# Patient Record
Sex: Male | Born: 1970
Health system: Southern US, Community
[De-identification: ages and names within clinical notes are randomized; demographics above are authoritative.]

## PROBLEM LIST (undated history)

## (undated) DIAGNOSIS — F419 Anxiety disorder, unspecified: Secondary | ICD-10-CM

## (undated) DIAGNOSIS — F32A Depression, unspecified: Secondary | ICD-10-CM

## (undated) DIAGNOSIS — K859 Acute pancreatitis without necrosis or infection, unspecified: Secondary | ICD-10-CM

## (undated) DIAGNOSIS — K219 Gastro-esophageal reflux disease without esophagitis: Secondary | ICD-10-CM

## (undated) DIAGNOSIS — K861 Other chronic pancreatitis: Secondary | ICD-10-CM

## (undated) DIAGNOSIS — F319 Bipolar disorder, unspecified: Secondary | ICD-10-CM

## (undated) DIAGNOSIS — Z9151 Personal history of suicidal behavior: Secondary | ICD-10-CM

## (undated) DIAGNOSIS — M549 Dorsalgia, unspecified: Secondary | ICD-10-CM

## (undated) DIAGNOSIS — Z915 Personal history of self-harm: Secondary | ICD-10-CM

## (undated) DIAGNOSIS — G8929 Other chronic pain: Secondary | ICD-10-CM

## (undated) DIAGNOSIS — J40 Bronchitis, not specified as acute or chronic: Secondary | ICD-10-CM

## (undated) DIAGNOSIS — J45909 Unspecified asthma, uncomplicated: Secondary | ICD-10-CM

## (undated) DIAGNOSIS — R7611 Nonspecific reaction to tuberculin skin test without active tuberculosis: Secondary | ICD-10-CM

## (undated) DIAGNOSIS — Z720 Tobacco use: Secondary | ICD-10-CM

## (undated) HISTORY — PX: OTHER SURGICAL HISTORY: SHX169

## (undated) HISTORY — PX: BACK SURGERY: SHX140

## (undated) HISTORY — DX: Other chronic pancreatitis: K86.1

## (undated) HISTORY — DX: Anxiety disorder, unspecified: F41.9

## (undated) HISTORY — DX: Tobacco use: Z72.0

## (undated) HISTORY — DX: Nonspecific reaction to tuberculin skin test without active tuberculosis: R76.11

---

## 1998-11-29 ENCOUNTER — Emergency Department (HOSPITAL_COMMUNITY): Admission: EM | Admit: 1998-11-29 | Discharge: 1998-11-29 | Payer: Self-pay | Admitting: Emergency Medicine

## 2000-02-03 ENCOUNTER — Emergency Department (HOSPITAL_COMMUNITY): Admission: EM | Admit: 2000-02-03 | Discharge: 2000-02-03 | Payer: Self-pay | Admitting: Emergency Medicine

## 2000-04-20 ENCOUNTER — Emergency Department (HOSPITAL_COMMUNITY): Admission: EM | Admit: 2000-04-20 | Discharge: 2000-04-20 | Payer: Self-pay | Admitting: Emergency Medicine

## 2000-08-07 ENCOUNTER — Emergency Department (HOSPITAL_COMMUNITY): Admission: EM | Admit: 2000-08-07 | Discharge: 2000-08-07 | Payer: Self-pay | Admitting: *Deleted

## 2000-11-12 ENCOUNTER — Emergency Department (HOSPITAL_COMMUNITY): Admission: EM | Admit: 2000-11-12 | Discharge: 2000-11-12 | Payer: Self-pay | Admitting: Emergency Medicine

## 2000-11-12 ENCOUNTER — Encounter: Payer: Self-pay | Admitting: Emergency Medicine

## 2000-11-23 ENCOUNTER — Emergency Department (HOSPITAL_COMMUNITY): Admission: EM | Admit: 2000-11-23 | Discharge: 2000-11-23 | Payer: Self-pay | Admitting: Emergency Medicine

## 2000-11-24 ENCOUNTER — Emergency Department (HOSPITAL_COMMUNITY): Admission: EM | Admit: 2000-11-24 | Discharge: 2000-11-24 | Payer: Self-pay | Admitting: Emergency Medicine

## 2000-12-04 ENCOUNTER — Emergency Department (HOSPITAL_COMMUNITY): Admission: EM | Admit: 2000-12-04 | Discharge: 2000-12-04 | Payer: Self-pay

## 2000-12-04 ENCOUNTER — Encounter: Payer: Self-pay | Admitting: Emergency Medicine

## 2000-12-26 ENCOUNTER — Encounter: Payer: Self-pay | Admitting: Emergency Medicine

## 2000-12-26 ENCOUNTER — Emergency Department (HOSPITAL_COMMUNITY): Admission: EM | Admit: 2000-12-26 | Discharge: 2000-12-26 | Payer: Self-pay | Admitting: Emergency Medicine

## 2001-07-16 ENCOUNTER — Emergency Department (HOSPITAL_COMMUNITY): Admission: EM | Admit: 2001-07-16 | Discharge: 2001-07-17 | Payer: Self-pay

## 2004-07-17 ENCOUNTER — Emergency Department (HOSPITAL_COMMUNITY): Admission: EM | Admit: 2004-07-17 | Discharge: 2004-07-17 | Payer: Self-pay | Admitting: Emergency Medicine

## 2004-10-28 ENCOUNTER — Emergency Department (HOSPITAL_COMMUNITY): Admission: EM | Admit: 2004-10-28 | Discharge: 2004-10-29 | Payer: Self-pay | Admitting: Emergency Medicine

## 2005-06-05 ENCOUNTER — Emergency Department (HOSPITAL_COMMUNITY): Admission: EM | Admit: 2005-06-05 | Discharge: 2005-06-06 | Payer: Self-pay | Admitting: Emergency Medicine

## 2005-06-07 ENCOUNTER — Inpatient Hospital Stay (HOSPITAL_COMMUNITY): Admission: EM | Admit: 2005-06-07 | Discharge: 2005-06-17 | Payer: Self-pay | Admitting: Psychiatry

## 2005-06-07 ENCOUNTER — Ambulatory Visit: Payer: Self-pay | Admitting: Psychiatry

## 2005-06-13 ENCOUNTER — Encounter: Payer: Self-pay | Admitting: Emergency Medicine

## 2007-01-21 ENCOUNTER — Emergency Department (HOSPITAL_COMMUNITY): Admission: EM | Admit: 2007-01-21 | Discharge: 2007-01-21 | Payer: Self-pay | Admitting: Emergency Medicine

## 2007-01-23 ENCOUNTER — Emergency Department (HOSPITAL_COMMUNITY): Admission: EM | Admit: 2007-01-23 | Discharge: 2007-01-23 | Payer: Self-pay | Admitting: Emergency Medicine

## 2007-02-02 ENCOUNTER — Ambulatory Visit: Payer: Self-pay | Admitting: Family Medicine

## 2007-02-03 ENCOUNTER — Ambulatory Visit: Payer: Self-pay | Admitting: *Deleted

## 2007-02-05 ENCOUNTER — Ambulatory Visit (HOSPITAL_COMMUNITY): Admission: RE | Admit: 2007-02-05 | Discharge: 2007-02-05 | Payer: Self-pay | Admitting: Internal Medicine

## 2007-02-09 ENCOUNTER — Ambulatory Visit: Payer: Self-pay | Admitting: Family Medicine

## 2007-02-17 ENCOUNTER — Ambulatory Visit: Payer: Self-pay | Admitting: Family Medicine

## 2007-04-09 ENCOUNTER — Encounter: Admission: RE | Admit: 2007-04-09 | Discharge: 2007-04-22 | Payer: Self-pay | Admitting: Sports Medicine

## 2007-05-01 ENCOUNTER — Encounter: Admission: RE | Admit: 2007-05-01 | Discharge: 2007-05-01 | Payer: Self-pay | Admitting: Sports Medicine

## 2007-05-09 ENCOUNTER — Emergency Department (HOSPITAL_COMMUNITY): Admission: EM | Admit: 2007-05-09 | Discharge: 2007-05-09 | Payer: Self-pay | Admitting: Emergency Medicine

## 2007-05-13 ENCOUNTER — Encounter: Admission: RE | Admit: 2007-05-13 | Discharge: 2007-05-13 | Payer: Self-pay | Admitting: Family Medicine

## 2007-05-19 ENCOUNTER — Encounter: Admission: RE | Admit: 2007-05-19 | Discharge: 2007-08-17 | Payer: Self-pay | Admitting: Sports Medicine

## 2007-05-23 ENCOUNTER — Emergency Department (HOSPITAL_COMMUNITY): Admission: EM | Admit: 2007-05-23 | Discharge: 2007-05-23 | Payer: Self-pay | Admitting: Emergency Medicine

## 2007-07-07 ENCOUNTER — Ambulatory Visit: Payer: Self-pay | Admitting: Internal Medicine

## 2007-07-09 ENCOUNTER — Ambulatory Visit (HOSPITAL_COMMUNITY): Admission: RE | Admit: 2007-07-09 | Discharge: 2007-07-09 | Payer: Self-pay | Admitting: Internal Medicine

## 2007-07-15 ENCOUNTER — Ambulatory Visit: Payer: Self-pay | Admitting: Internal Medicine

## 2007-09-07 ENCOUNTER — Encounter (INDEPENDENT_AMBULATORY_CARE_PROVIDER_SITE_OTHER): Payer: Self-pay | Admitting: Family Medicine

## 2007-09-07 DIAGNOSIS — F429 Obsessive-compulsive disorder, unspecified: Secondary | ICD-10-CM | POA: Insufficient documentation

## 2007-09-07 DIAGNOSIS — F1021 Alcohol dependence, in remission: Secondary | ICD-10-CM

## 2007-09-07 DIAGNOSIS — F1011 Alcohol abuse, in remission: Secondary | ICD-10-CM | POA: Insufficient documentation

## 2007-09-07 DIAGNOSIS — F1210 Cannabis abuse, uncomplicated: Secondary | ICD-10-CM | POA: Insufficient documentation

## 2007-09-16 ENCOUNTER — Encounter (INDEPENDENT_AMBULATORY_CARE_PROVIDER_SITE_OTHER): Payer: Self-pay | Admitting: *Deleted

## 2008-01-14 ENCOUNTER — Emergency Department (HOSPITAL_COMMUNITY): Admission: EM | Admit: 2008-01-14 | Discharge: 2008-01-14 | Payer: Self-pay | Admitting: Emergency Medicine

## 2008-03-17 ENCOUNTER — Emergency Department (HOSPITAL_COMMUNITY): Admission: EM | Admit: 2008-03-17 | Discharge: 2008-03-17 | Payer: Self-pay | Admitting: Family Medicine

## 2008-03-23 ENCOUNTER — Emergency Department (HOSPITAL_COMMUNITY): Admission: EM | Admit: 2008-03-23 | Discharge: 2008-03-23 | Payer: Self-pay | Admitting: Emergency Medicine

## 2008-03-24 ENCOUNTER — Inpatient Hospital Stay (HOSPITAL_COMMUNITY): Admission: EM | Admit: 2008-03-24 | Discharge: 2008-04-19 | Payer: Self-pay | Admitting: Emergency Medicine

## 2008-03-24 ENCOUNTER — Ambulatory Visit: Payer: Self-pay | Admitting: Internal Medicine

## 2008-04-12 ENCOUNTER — Ambulatory Visit: Payer: Self-pay | Admitting: Psychiatry

## 2008-04-20 ENCOUNTER — Ambulatory Visit: Payer: Self-pay | Admitting: Internal Medicine

## 2008-04-28 ENCOUNTER — Ambulatory Visit: Payer: Self-pay | Admitting: Internal Medicine

## 2008-05-05 ENCOUNTER — Ambulatory Visit: Payer: Self-pay | Admitting: Internal Medicine

## 2008-05-19 ENCOUNTER — Ambulatory Visit: Payer: Self-pay | Admitting: Internal Medicine

## 2008-05-20 ENCOUNTER — Ambulatory Visit: Payer: Self-pay | Admitting: Internal Medicine

## 2008-05-27 ENCOUNTER — Ambulatory Visit (HOSPITAL_COMMUNITY): Admission: RE | Admit: 2008-05-27 | Discharge: 2008-05-27 | Payer: Self-pay | Admitting: Internal Medicine

## 2008-06-07 ENCOUNTER — Ambulatory Visit: Payer: Self-pay | Admitting: Internal Medicine

## 2008-06-07 ENCOUNTER — Emergency Department (HOSPITAL_COMMUNITY): Admission: EM | Admit: 2008-06-07 | Discharge: 2008-06-07 | Payer: Self-pay | Admitting: Emergency Medicine

## 2008-07-07 ENCOUNTER — Ambulatory Visit: Payer: Self-pay | Admitting: Internal Medicine

## 2008-07-08 ENCOUNTER — Ambulatory Visit (HOSPITAL_COMMUNITY): Admission: RE | Admit: 2008-07-08 | Discharge: 2008-07-08 | Payer: Self-pay | Admitting: Internal Medicine

## 2008-08-24 ENCOUNTER — Ambulatory Visit: Payer: Self-pay | Admitting: Internal Medicine

## 2008-08-24 LAB — CONVERTED CEMR LAB
AST: 11 units/L (ref 0–37)
Alkaline Phosphatase: 54 units/L (ref 39–117)
BUN: 16 mg/dL (ref 6–23)
Basophils Relative: 1 % (ref 0–1)
Calcium: 10.5 mg/dL (ref 8.4–10.5)
Chloride: 101 meq/L (ref 96–112)
Creatinine, Ser: 0.98 mg/dL (ref 0.40–1.50)
Eosinophils Absolute: 0 10*3/uL (ref 0.0–0.7)
HCT: 41.7 % (ref 39.0–52.0)
Hemoglobin: 14 g/dL (ref 13.0–17.0)
MCHC: 33.6 g/dL (ref 30.0–36.0)
MCV: 82.9 fL (ref 78.0–100.0)
Monocytes Absolute: 0.6 10*3/uL (ref 0.1–1.0)
Monocytes Relative: 13 % — ABNORMAL HIGH (ref 3–12)
RBC: 5.03 M/uL (ref 4.22–5.81)

## 2008-09-28 ENCOUNTER — Emergency Department (HOSPITAL_COMMUNITY): Admission: EM | Admit: 2008-09-28 | Discharge: 2008-09-28 | Payer: Self-pay | Admitting: Family Medicine

## 2008-10-13 ENCOUNTER — Ambulatory Visit: Payer: Self-pay | Admitting: Internal Medicine

## 2008-11-09 ENCOUNTER — Ambulatory Visit: Payer: Self-pay | Admitting: Internal Medicine

## 2008-11-14 IMAGING — CT CT PELVIS W/ CM
1 of 3 series · 14 of 32 positions shown, 19 images · IV contrast (agent unspecified)
Comparison: 04/01/2008

CT ABDOMEN

CLINICAL DATA: Follow-up of pancreatitis and portal vein
thrombosis.  Nausea vomiting.

CT ABDOMEN AND PELVIS WITH CONTRAST
TECHNIQUE: Multidetector CT imaging of the abdomen and pelvis was
performed using the standard protocol following bolus
administration of intravenous contrast.
Contrast: 100 ml Qmnipaque-UVV and oral contrast.  The patient was
premedicated with Demerol of 75 mg Benadryl.

[Series 2: abd_pel 5.0 b40f st · axial · 0.68mm/px · z∈[+728,+1198]mm · 14 of 108 slices shown, 19 images]
[im 7/108  soft-tissue]
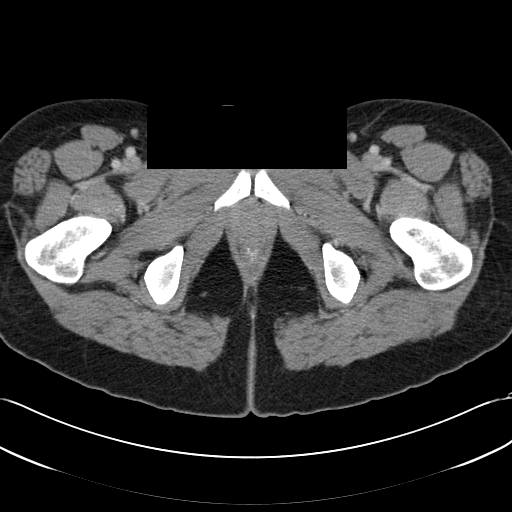
[im 7/108  bone]
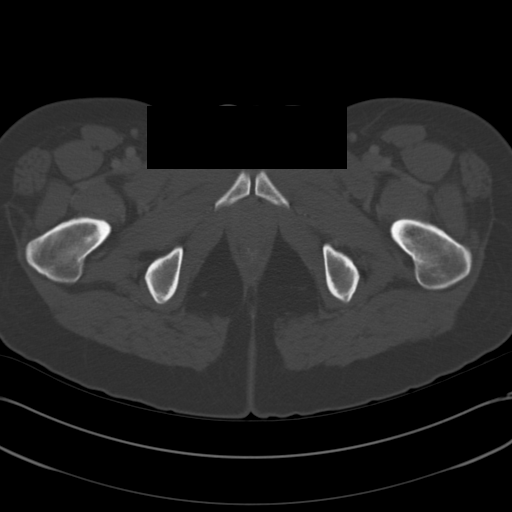
[im 13/108  soft-tissue]
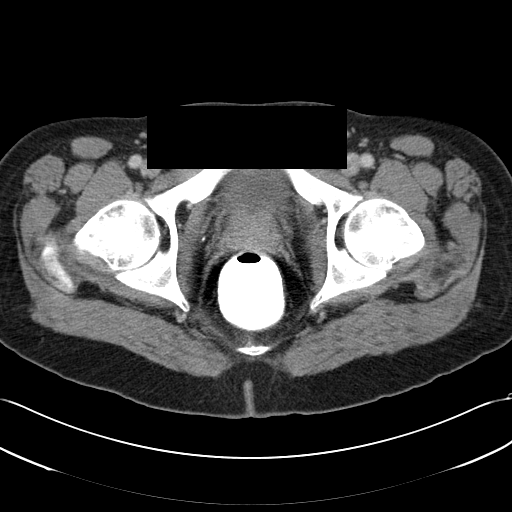
[im 26/108  soft-tissue]
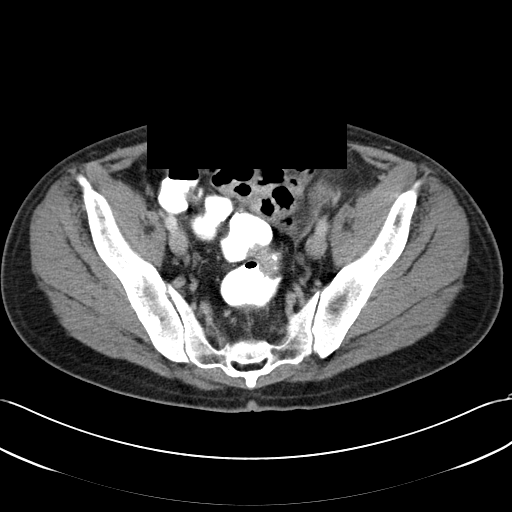
[im 32/108  soft-tissue]
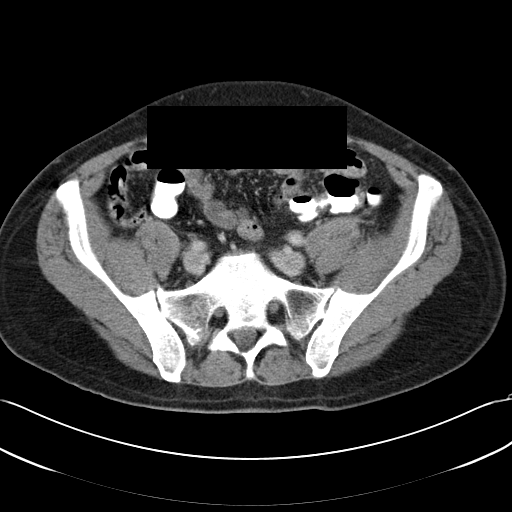
[im 38/108  soft-tissue]
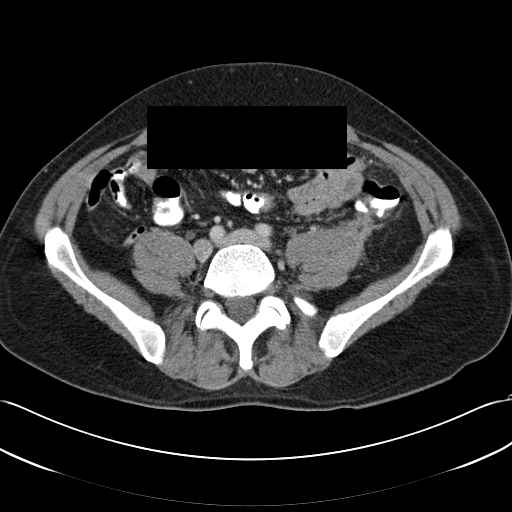
[im 45/108  soft-tissue]
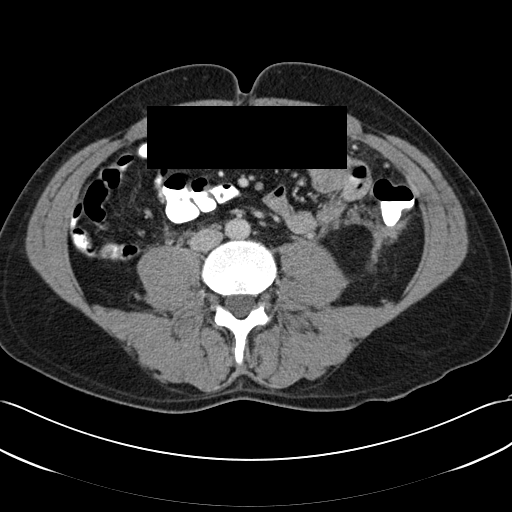
[im 57/108  soft-tissue]
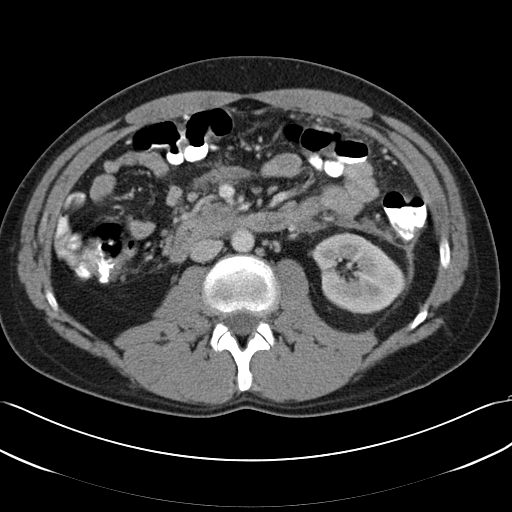
[im 63/108  soft-tissue]
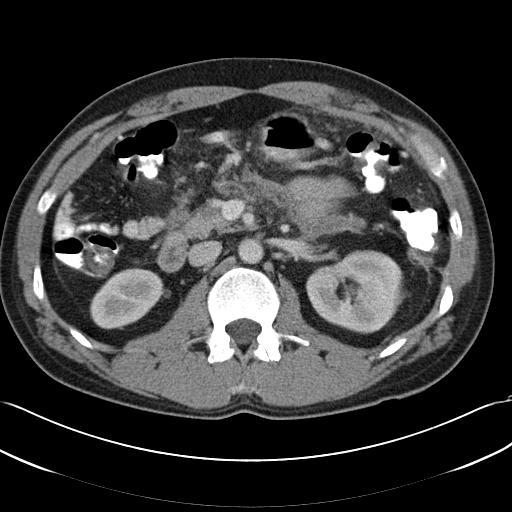
[im 70/108  soft-tissue]
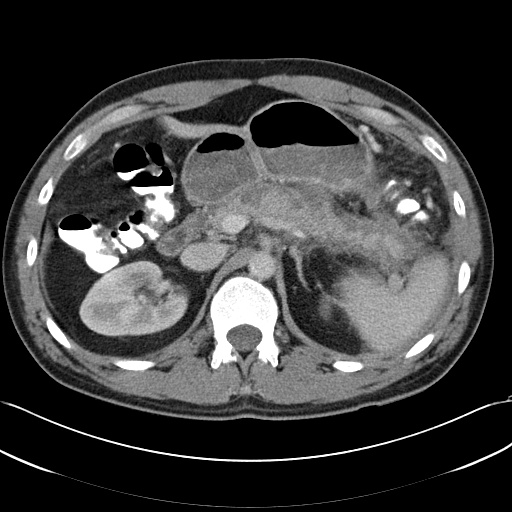
[im 70/108  bone]
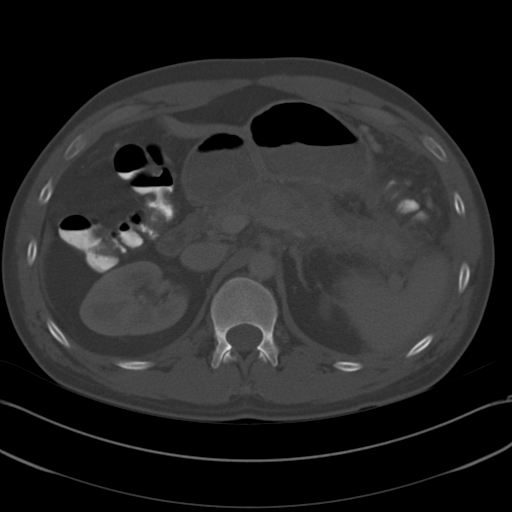
[im 76/108  soft-tissue]
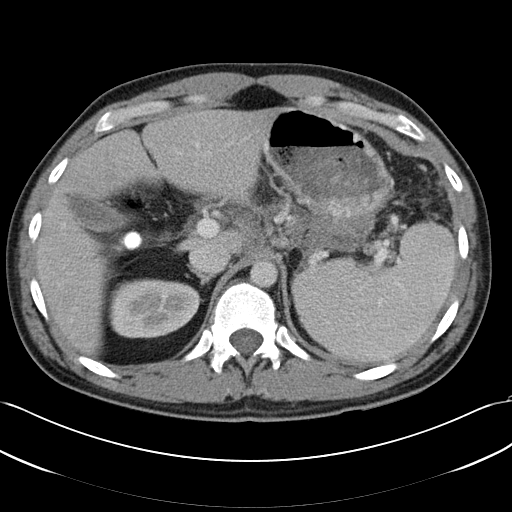
[im 82/108  soft-tissue]
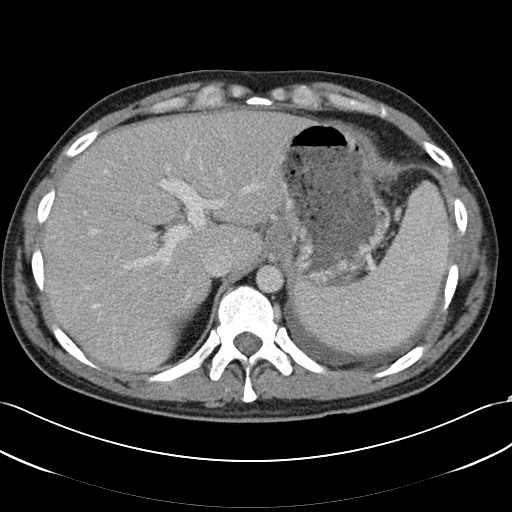
[im 82/108  lung]
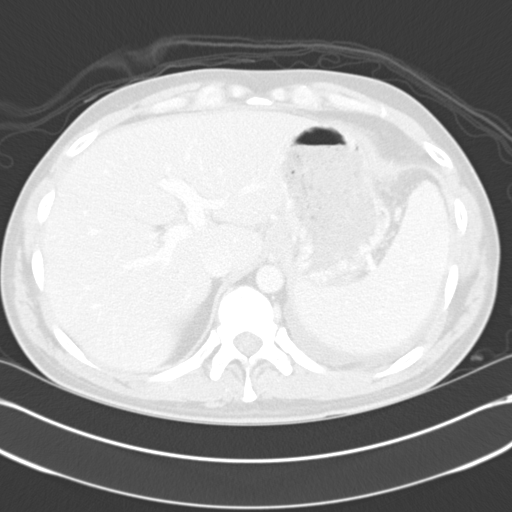
[im 89/108  lung]
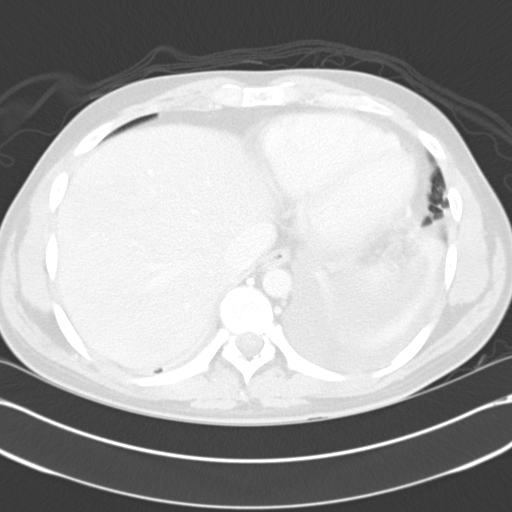
[im 95/108  soft-tissue]
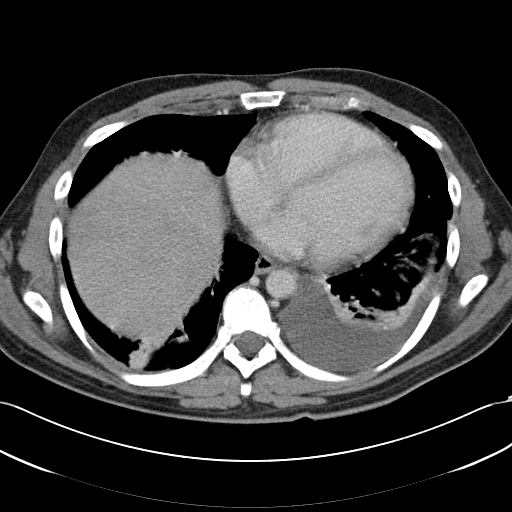
[im 95/108  lung]
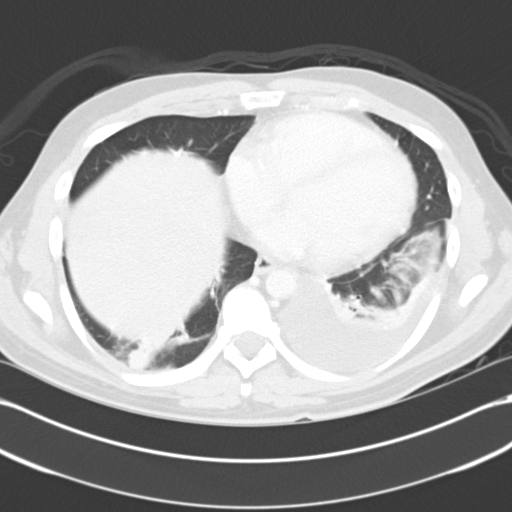
[im 101/108  soft-tissue]
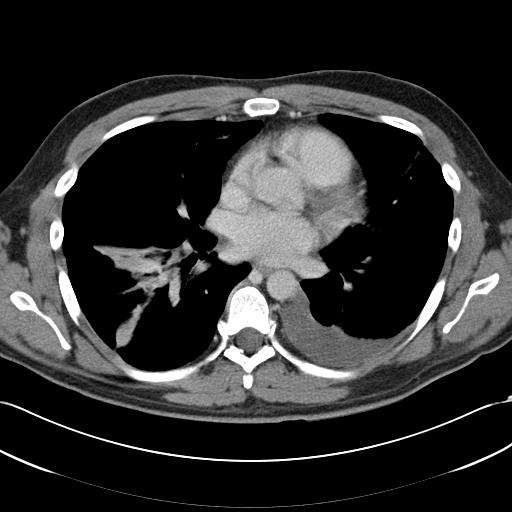
[im 101/108  lung]
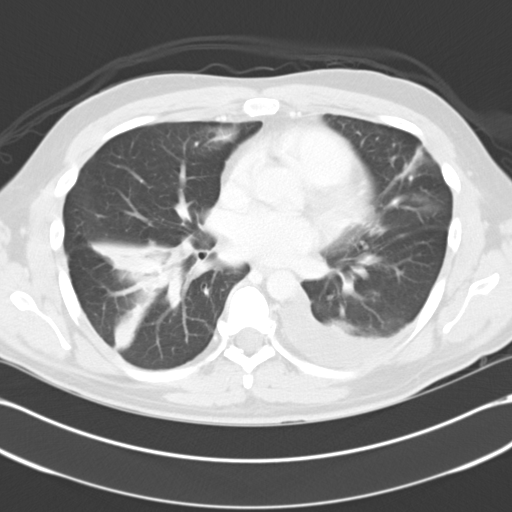

[14 of 32 positions shown; findings below may reference images not displayed]

FINDINGS: Bilateral pleural effusions and atelectasis have
improved since prior study.

Peripancreatic inflammatory changes and fluid also show
improvement.  There is decreased secondary gastric wall thickening.
A small peripancreatic fluid collection appears more well-organized
inferior to the body of the pancreas which measures 4.9 x 3.2 cm on
image number 43.  This is consistent with a developing pseudocyst.

There is no evidence of portal vein thrombosis.  Probable mid
splenic vein thrombosis noted, but not significantly changed in
appearance. The other abdominal parenchymal organs are
unremarkable.
IMPRESSION: 1. Improving acute pancreatitis.  Probable developing pseudocyst
inferior to the pancreatic body measuring 4.9 cm.
2.  Decreased bilateral pleural effusions and bibasilar
atelectasis.
3.  No evidence of portal vein thrombosis on current exam.
Probable mid splenic vein thrombosis again noted.

CT PELVIS
FINDINGS: Decreased retroperitoneal inflammatory changes are seen
extending into the upper left pelvis since prior study.  No
abnormal fluid collections are noted within the pelvis.  There is
no evidence of dilated bowel loops.  There is no evidence of pelvic
mass.
IMPRESSION: Decrease in peripancreatic inflammatory changes extending into the
upper left pelvis.

## 2008-12-09 ENCOUNTER — Ambulatory Visit: Payer: Self-pay | Admitting: Internal Medicine

## 2008-12-21 ENCOUNTER — Inpatient Hospital Stay (HOSPITAL_COMMUNITY): Admission: EM | Admit: 2008-12-21 | Discharge: 2008-12-27 | Payer: Self-pay | Admitting: Emergency Medicine

## 2009-01-03 ENCOUNTER — Ambulatory Visit: Payer: Self-pay | Admitting: Internal Medicine

## 2009-01-30 ENCOUNTER — Emergency Department (HOSPITAL_COMMUNITY): Admission: EM | Admit: 2009-01-30 | Discharge: 2009-01-30 | Payer: Self-pay | Admitting: Emergency Medicine

## 2009-02-01 ENCOUNTER — Ambulatory Visit: Payer: Self-pay | Admitting: Internal Medicine

## 2009-02-19 ENCOUNTER — Emergency Department (HOSPITAL_COMMUNITY): Admission: EM | Admit: 2009-02-19 | Discharge: 2009-02-19 | Payer: Self-pay | Admitting: Emergency Medicine

## 2009-02-23 ENCOUNTER — Ambulatory Visit: Payer: Self-pay | Admitting: Internal Medicine

## 2009-03-16 ENCOUNTER — Other Ambulatory Visit: Payer: Self-pay

## 2009-03-17 ENCOUNTER — Ambulatory Visit: Payer: Self-pay | Admitting: Psychiatry

## 2009-03-17 ENCOUNTER — Inpatient Hospital Stay (HOSPITAL_COMMUNITY): Admission: RE | Admit: 2009-03-17 | Discharge: 2009-03-20 | Payer: Self-pay | Admitting: Psychiatry

## 2009-03-23 ENCOUNTER — Ambulatory Visit: Payer: Self-pay | Admitting: Internal Medicine

## 2009-04-20 ENCOUNTER — Ambulatory Visit: Payer: Self-pay | Admitting: Family Medicine

## 2009-05-31 ENCOUNTER — Ambulatory Visit: Payer: Self-pay | Admitting: Internal Medicine

## 2009-06-21 ENCOUNTER — Ambulatory Visit: Payer: Self-pay | Admitting: Internal Medicine

## 2009-06-21 LAB — CONVERTED CEMR LAB
ALT: 9 units/L (ref 0–53)
AST: 15 units/L (ref 0–37)
Alkaline Phosphatase: 59 units/L (ref 39–117)
Bilirubin, Direct: 0.1 mg/dL (ref 0.0–0.3)
Indirect Bilirubin: 0.5 mg/dL (ref 0.0–0.9)

## 2009-06-23 ENCOUNTER — Ambulatory Visit: Payer: Self-pay | Admitting: Internal Medicine

## 2009-06-30 ENCOUNTER — Emergency Department (HOSPITAL_COMMUNITY): Admission: EM | Admit: 2009-06-30 | Discharge: 2009-06-30 | Payer: Self-pay | Admitting: Emergency Medicine

## 2009-09-25 ENCOUNTER — Encounter: Admission: RE | Admit: 2009-09-25 | Discharge: 2009-12-11 | Payer: Self-pay | Admitting: Anesthesiology

## 2009-09-27 ENCOUNTER — Ambulatory Visit: Payer: Self-pay | Admitting: Internal Medicine

## 2010-02-22 ENCOUNTER — Ambulatory Visit: Payer: Self-pay | Admitting: Internal Medicine

## 2010-02-22 LAB — CONVERTED CEMR LAB
CO2: 25 meq/L (ref 19–32)
Calcium: 9.7 mg/dL (ref 8.4–10.5)
Chloride: 101 meq/L (ref 96–112)
Creatinine, Ser: 0.89 mg/dL (ref 0.40–1.50)
Glucose, Bld: 102 mg/dL — ABNORMAL HIGH (ref 70–99)
Hgb A1c MFr Bld: 5.3 % (ref 4.6–6.1)
Lipase: <10 units/L (ref 0–75)
Total Bilirubin: 0.4 mg/dL (ref 0.3–1.2)
Total Protein: 7 g/dL (ref 6.0–8.3)

## 2010-04-09 ENCOUNTER — Ambulatory Visit: Payer: Self-pay | Admitting: Family Medicine

## 2010-06-04 ENCOUNTER — Ambulatory Visit: Payer: Self-pay | Admitting: Family Medicine

## 2010-08-17 ENCOUNTER — Encounter: Admission: RE | Admit: 2010-08-17 | Discharge: 2010-08-17 | Payer: Self-pay | Admitting: Family Medicine

## 2010-08-17 ENCOUNTER — Ambulatory Visit: Payer: Self-pay | Admitting: Family Medicine

## 2010-08-24 ENCOUNTER — Ambulatory Visit: Payer: Self-pay | Admitting: Family Medicine

## 2010-09-25 ENCOUNTER — Ambulatory Visit: Payer: Self-pay | Admitting: Family Medicine

## 2011-01-20 ENCOUNTER — Encounter: Payer: Self-pay | Admitting: Sports Medicine

## 2011-02-01 ENCOUNTER — Encounter (INDEPENDENT_AMBULATORY_CARE_PROVIDER_SITE_OTHER): Payer: Medicare Other | Admitting: Family Medicine

## 2011-02-01 DIAGNOSIS — R5383 Other fatigue: Secondary | ICD-10-CM

## 2011-02-01 DIAGNOSIS — K859 Acute pancreatitis without necrosis or infection, unspecified: Secondary | ICD-10-CM

## 2011-02-01 DIAGNOSIS — F1010 Alcohol abuse, uncomplicated: Secondary | ICD-10-CM

## 2011-02-01 DIAGNOSIS — Z0000 Encounter for general adult medical examination without abnormal findings: Secondary | ICD-10-CM

## 2011-02-23 HISTORY — PX: OTHER SURGICAL HISTORY: SHX169

## 2011-03-06 ENCOUNTER — Ambulatory Visit (INDEPENDENT_AMBULATORY_CARE_PROVIDER_SITE_OTHER): Payer: Medicare Other | Admitting: Family Medicine

## 2011-03-06 DIAGNOSIS — R5381 Other malaise: Secondary | ICD-10-CM

## 2011-03-06 DIAGNOSIS — Z79899 Other long term (current) drug therapy: Secondary | ICD-10-CM

## 2011-03-06 DIAGNOSIS — F17200 Nicotine dependence, unspecified, uncomplicated: Secondary | ICD-10-CM

## 2011-03-06 DIAGNOSIS — R252 Cramp and spasm: Secondary | ICD-10-CM

## 2011-03-28 ENCOUNTER — Ambulatory Visit (INDEPENDENT_AMBULATORY_CARE_PROVIDER_SITE_OTHER): Payer: Medicare Other | Admitting: Family Medicine

## 2011-03-28 DIAGNOSIS — IMO0001 Reserved for inherently not codable concepts without codable children: Secondary | ICD-10-CM

## 2011-04-02 ENCOUNTER — Emergency Department (HOSPITAL_COMMUNITY)
Admission: EM | Admit: 2011-04-02 | Discharge: 2011-04-03 | Disposition: A | Discharge status: Home / Self Care | Payer: Medicare Other | Attending: Emergency Medicine | Admitting: Emergency Medicine

## 2011-04-02 DIAGNOSIS — IMO0001 Reserved for inherently not codable concepts without codable children: Secondary | ICD-10-CM | POA: Insufficient documentation

## 2011-04-02 DIAGNOSIS — F341 Dysthymic disorder: Secondary | ICD-10-CM | POA: Insufficient documentation

## 2011-04-02 LAB — CBC
HCT: 44.8 % (ref 39.0–52.0)
RDW: 12.9 % (ref 11.5–15.5)
WBC: 6 10*3/uL (ref 4.0–10.5)

## 2011-04-02 LAB — COMPREHENSIVE METABOLIC PANEL
ALT: 20 U/L (ref 0–53)
AST: 27 U/L (ref 0–37)
Albumin: 4.8 g/dL (ref 3.5–5.2)
Alkaline Phosphatase: 59 U/L (ref 39–117)
Calcium: 10.3 mg/dL (ref 8.4–10.5)
GFR calc Af Amer: >60 mL/min (ref >60)
Glucose, Bld: 95 mg/dL (ref 70–99)
Potassium: 3.9 mEq/L (ref 3.5–5.1)
Sodium: 140 mEq/L (ref 135–145)
Total Protein: 7.8 g/dL (ref 6.0–8.3)

## 2011-04-02 LAB — DIFFERENTIAL
Basophils Absolute: 0.1 10*3/uL (ref 0.0–0.1)
Basophils Relative: 1 % (ref 0–1)
Eosinophils Relative: 3 % (ref 0–5)
Lymphocytes Relative: 31 % (ref 12–46)
Neutro Abs: 3.2 10*3/uL (ref 1.7–7.7)

## 2011-04-02 LAB — LIPASE, BLOOD: Lipase: 22 U/L (ref 11–59)

## 2011-04-03 LAB — CK: Total CK: 344 U/L — ABNORMAL HIGH (ref 7–232)

## 2011-04-08 LAB — URINALYSIS, ROUTINE W REFLEX MICROSCOPIC
Glucose, UA: NEGATIVE mg/dL
Hgb urine dipstick: NEGATIVE
Specific Gravity, Urine: 1.006 (ref 1.005–1.030)

## 2011-04-08 LAB — DIFFERENTIAL
Lymphocytes Relative: 38 % (ref 12–46)
Monocytes Absolute: 0.4 10*3/uL (ref 0.1–1.0)
Monocytes Relative: 12 % (ref 3–12)
Neutro Abs: 1.6 10*3/uL — ABNORMAL LOW (ref 1.7–7.7)

## 2011-04-08 LAB — COMPREHENSIVE METABOLIC PANEL
Albumin: 4.5 g/dL (ref 3.5–5.2)
BUN: 14 mg/dL (ref 6–23)
Creatinine, Ser: 0.91 mg/dL (ref 0.4–1.5)
Potassium: 4 mEq/L (ref 3.5–5.1)
Total Protein: 6.7 g/dL (ref 6.0–8.3)

## 2011-04-08 LAB — CBC
HCT: 39.8 % (ref 39.0–52.0)
MCV: 89.4 fL (ref 78.0–100.0)
Platelets: 188 10*3/uL (ref 150–400)
RDW: 12.9 % (ref 11.5–15.5)

## 2011-04-08 LAB — ETHANOL: Alcohol, Ethyl (B): <5 mg/dL (ref 0–10)

## 2011-04-11 LAB — DIFFERENTIAL
Basophils Relative: 1 % (ref 0–1)
Eosinophils Absolute: 0.1 10*3/uL (ref 0.0–0.7)
Eosinophils Relative: 2 % (ref 0–5)
Monocytes Relative: 10 % (ref 3–12)
Neutrophils Relative %: 52 % (ref 43–77)

## 2011-04-11 LAB — ETHANOL: Alcohol, Ethyl (B): 206 mg/dL — ABNORMAL HIGH (ref 0–10)

## 2011-04-11 LAB — COMPREHENSIVE METABOLIC PANEL
ALT: 15 U/L (ref 0–53)
AST: 21 U/L (ref 0–37)
Alkaline Phosphatase: 98 U/L (ref 39–117)
CO2: 29 mEq/L (ref 19–32)
Glucose, Bld: 113 mg/dL — ABNORMAL HIGH (ref 70–99)
Potassium: 4.1 mEq/L (ref 3.5–5.1)
Sodium: 138 mEq/L (ref 135–145)
Total Protein: 6.9 g/dL (ref 6.0–8.3)

## 2011-04-11 LAB — RAPID URINE DRUG SCREEN, HOSP PERFORMED
Amphetamines: NOT DETECTED
Benzodiazepines: POSITIVE — AB
Tetrahydrocannabinol: NOT DETECTED

## 2011-04-11 LAB — ACETAMINOPHEN LEVEL: Acetaminophen (Tylenol), Serum: <10 ug/mL — ABNORMAL LOW (ref 10–30)

## 2011-04-11 LAB — CBC
Hemoglobin: 14.4 g/dL (ref 13.0–17.0)
RBC: 4.72 MIL/uL (ref 4.22–5.81)
RDW: 14.8 % (ref 11.5–15.5)

## 2011-04-11 LAB — AMYLASE: Amylase: 355 U/L — ABNORMAL HIGH (ref 27–131)

## 2011-04-11 LAB — LIPASE, BLOOD: Lipase: 332 U/L — ABNORMAL HIGH (ref 11–59)

## 2011-04-11 LAB — SALICYLATE LEVEL: Salicylate Lvl: <4 mg/dL (ref 2.8–20.0)

## 2011-04-16 LAB — COMPREHENSIVE METABOLIC PANEL
Albumin: 4.6 g/dL (ref 3.5–5.2)
Alkaline Phosphatase: 76 U/L (ref 39–117)
BUN: 11 mg/dL (ref 6–23)
BUN: 7 mg/dL (ref 6–23)
CO2: 28 mEq/L (ref 19–32)
Calcium: 8.8 mg/dL (ref 8.4–10.5)
Chloride: 98 mEq/L (ref 96–112)
Creatinine, Ser: 0.73 mg/dL (ref 0.4–1.5)
GFR calc non Af Amer: >60 mL/min (ref >60)
Glucose, Bld: 114 mg/dL — ABNORMAL HIGH (ref 70–99)
Glucose, Bld: 109 mg/dL — ABNORMAL HIGH (ref 70–99)
Potassium: 4.2 mEq/L (ref 3.5–5.1)
Total Bilirubin: 0.7 mg/dL (ref 0.3–1.2)
Total Protein: 6.2 g/dL (ref 6.0–8.3)

## 2011-04-16 LAB — CBC
HCT: 40.6 % (ref 39.0–52.0)
Hemoglobin: 13.7 g/dL (ref 13.0–17.0)
MCHC: 33.3 g/dL (ref 30.0–36.0)
MCHC: 33.7 g/dL (ref 30.0–36.0)
MCV: 89.6 fL (ref 78.0–100.0)
MCV: 89.4 fL (ref 78.0–100.0)
RDW: 15 % (ref 11.5–15.5)

## 2011-04-16 LAB — RAPID URINE DRUG SCREEN, HOSP PERFORMED
Amphetamines: NOT DETECTED
Barbiturates: NOT DETECTED
Cocaine: POSITIVE — AB
Opiates: NOT DETECTED
Tetrahydrocannabinol: NOT DETECTED
Tetrahydrocannabinol: NOT DETECTED

## 2011-04-16 LAB — URINALYSIS, ROUTINE W REFLEX MICROSCOPIC
Hgb urine dipstick: NEGATIVE
Protein, ur: NEGATIVE mg/dL
Urobilinogen, UA: 1 mg/dL (ref 0.0–1.0)

## 2011-04-16 LAB — DIFFERENTIAL
Basophils Absolute: 0 10*3/uL (ref 0.0–0.1)
Basophils Absolute: 0 10*3/uL (ref 0.0–0.1)
Basophils Relative: 0 % (ref 0–1)
Eosinophils Absolute: 0.1 10*3/uL (ref 0.0–0.7)
Eosinophils Relative: 1 % (ref 0–5)
Lymphs Abs: 0.9 10*3/uL (ref 0.7–4.0)
Monocytes Absolute: 0.3 10*3/uL (ref 0.1–1.0)
Neutro Abs: 5.7 10*3/uL (ref 1.7–7.7)
Neutrophils Relative %: 86 % — ABNORMAL HIGH (ref 43–77)
Neutrophils Relative %: 75 % (ref 43–77)

## 2011-04-16 LAB — LIPASE, BLOOD: Lipase: 11 U/L (ref 11–59)

## 2011-05-06 ENCOUNTER — Ambulatory Visit (INDEPENDENT_AMBULATORY_CARE_PROVIDER_SITE_OTHER): Payer: Medicare Other | Admitting: Family Medicine

## 2011-05-06 DIAGNOSIS — R5381 Other malaise: Secondary | ICD-10-CM

## 2011-05-06 DIAGNOSIS — IMO0001 Reserved for inherently not codable concepts without codable children: Secondary | ICD-10-CM

## 2011-05-14 NOTE — H&P (Signed)
[name], Lawrence NO.:  [id number]   MEDICAL RECORD NO.:  [id number]          PATIENT TYPE:  INP   LOCATION:  5511                         FACILITY:  [facility]   PHYSICIAN:  Lonia Blood, M.D.DATE OF BIRTH:  1971/03/28   DATE OF ADMISSION:  12/21/2008  DATE OF DISCHARGE:                              HISTORY & PHYSICAL   PRIMARY CARE PHYSICIAN:  Joe Lawrence, M.D.   CHIEF COMPLAINT:  Abdominal pain.   HISTORY OF PRESENT ILLNESS:  [name] is a 40 year old who  is known to our service.  He has a long-standing history of chronic  recurrent pancreatitis secondary to alcohol abuse.  He swears up and  down that he has not been drinking alcohol, despite the fact that an  alcohol level of 7 is detected in the emergency room.  Nonetheless he  admits to a one-month history of steadily increasing abdominal pain.  He  states that he has presented to his primary care physician who would not  refill his pain medication prescriptions early and who he states would  not change his baseline pain medication regimen.  The patient does have  a known history of benzodiazepine abuse on top of his alcoholism.  Nonetheless the patient admits that he has been using extra Oxycodone  and has thus run out despite the fact that it is not yet the end of the  month.  As a result he has experienced severe abdominal pain.  With this  he has had very decreased p.o. intake for the past 3-4 days per his  report.  He however denies nausea, vomiting, diarrhea, constipation,  hematemesis, hematochezia, or melena.  There has been no abdominal  distention.  Again he vehemently denies alcohol abuse though his alcohol  level is not negative in the emergency room.   REVIEW OF SYSTEMS:  Comprehensive review of systems is unremarkable with  the exception of the positive elements noted in the history of present  illness above.   PAST MEDICAL HISTORY:  1. Chronic recurrent  pancreatitis secondary to alcoholism.  2. Splenic vein thrombosis with previous completion of course of      anticoagulation.  3. Chronic severe alcoholism.  4. Chronic low back pain.  5. Hypertension.  6. Tobacco abuse.  7. Anxiety disorder.  8. History of benzodiazepine abuse.   OUTPATIENT MEDICATIONS:  1. Celexa 40 mg daily.  2. Seroquel 300 mg daily.  3. Fentanyl patch 75 mcg per hour q.72 h.  4. Oxycodone 30 mg q.i.d.  5. Creon 24 one tablet q.a.c.  6. Lorazepam 1 mg in the morning, 1 mg in the evening and 0.5 mg at      bedtime.  7. Omeprazole 40 mg daily.   ALLERGIES:  1. SULFA.  2. IODINE.  3. THORAZINE.   FAMILY HISTORY:  Noncontributory to this admission.   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  He denies use of street drugs.  He lives in the [place] area.   LABORATORY DATA:  CBC is unremarkable.  BMET is unremarkable.  Albumin  is  4.1.  Alcohol level is 7.  Lipase is 14.  Urinalysis is negative.  Urine drug screen is positive for opiates and benzodiazepines.  Acetaminophen level is undetectable.   PHYSICAL EXAMINATION:  VITAL SIGNS:  Temperature 98, blood pressure  136/86, heart rate 108, respiratory rate 22, O2 saturations 99% on room  air.  GENERAL:  Thin, well-developed gentleman in no acute respiratory  distress.  NECK:  No JVD.  LUNGS:  Clear to auscultation bilaterally without wheezes or rhonchi.  CARDIOVASCULAR:  Regular rate and rhythm without murmur, gallop, or rub.  Normal S2 with the exception of mild sinus tachycardia.  ABDOMEN:  Diffusely tender to palpation but worsened epigastrium.  No  rebound, no ascites, nondistended, soft.  Bowel sounds are appreciable.  EXTREMITIES:  No cyanosis, clubbing, or edema of bilateral upper and  lower extremities.  NEUROLOGIC:  The patient's neurologic exam is nonfocal.   IMPRESSION/PLAN:  1. Severe recurrent abdominal pain.  It is unclear to me at this time      if this  represents a recurrence of chronic pancreatitis pain or if      in fact this is simply a patient who is attempting to receive pain      medication.  For now I have no definite way to tell the difference.      In the interest of assuring that this gentleman is not suffering      with treatable physiologic pain, I will admit the patient for pain      control.  I will however be mindful to avoid over aggressive use of      pain medications.  We will treat him with Dilaudid at 1 mg IV q.2      h. p.r.n. pain.  As long as the patient is requiring IV pain      medications we will limit him to n.p.o. status.  At such time that      the patient feels that his pain is improved we will discontinue all      IV narcotics and transition him back to fentanyl and Oxycodone,      resume his Creon, and monitor.  If we see evidence that the patient      is clearly pain seeking during his hospital stay we will consider      psychiatric consultation and referral to Behavioral Health for      treatment.  2. Benzodiazepine abuse.  The patient is chronically on      benzodiazepines in the outpatient setting.  Therefore I cannot      discontinue his benzos without suffering probable episode of severe      withdrawal and possibly even seizure.  I will continue his Ativan      as per his outpatient dose as confirmed by my own evaluation of his      prescription bottles.  I will not however adjust his Ativan dosing      while he is in the hospital (i.e., I will not increase his Ativan      dose).  3. Alcoholism.  I have had an extensive discussion with the patient      about his ongoing alcohol use.  Again he vehemently denies any use      of alcohol.  He states that he did take NyQuil approximately two      nights ago.  I have assured him that this would not continue to      show up as a  positive alcohol level at this point.  He continues to      persist in his insistence that he is not drinking.  Nonetheless I       have advised him of the direct connection between alcohol abuse and      his pancreatitis as well as the likelihood of other, more severe,      more long term, and possibly life threatening physical detrimental      effects.  I have advised him to fully refrain from any alcohol      abuse from this point forward and have also advised him that for      him it would be wise to not use any alcohol-containing      pharmaceutical products to even include Listerine, similar      mouthwashes, and NyQuil.  4. I have counseled the patient as to multiple deleterious effects of      ongoing tobacco abuse.  I have advised him to discontinue smoking      immediately.  We will place a nicotine patch if the patient      displays evidence of severe nicotine withdrawal.      Lonia Blood, M.D.  Electronically Signed     JTM/MEDQ  D:  12/21/2008  T:  12/21/2008  Job:  [id number]   cc:   Joe Lawrence, M.D.

## 2011-05-14 NOTE — Consult Note (Signed)
NAMEBECKER, CHRISTOPHER NO.:  [id number]   MEDICAL RECORD NO.:  [id number]          PATIENT TYPE:  INP   LOCATION:  1232                         FACILITY:  [facility]   PHYSICIAN:  Ardeth Sportsman, MD     DATE OF BIRTH:  Jun 09, 1971   DATE OF CONSULTATION:  DATE OF DISCHARGE:                                 CONSULTATION   PRIMARY CARE PHYSICIAN:  He is with [facility].   REQUESTING PHYSICIAN:  Lucita Ferrara, M.D. with [facility].   REASON FOR CONSULTATION:  Acute pancreatitis, probable alcoholic in  etiology with question of colonic ileus versus obstruction.   HISTORY OF PRESENT ILLNESS:  Mr. Paredez is a 40 year old male who is  a chronic alcoholic who had a complaint of severe abdominal pain noted  in the mid gastric region with nausea, vomiting, and emesis.  He was  found to have evidence of pancreatitis by CT scan and was admitted.  He  has had some fair urine output.  He has not had much in the way of bowel  movements.  He has had a little bit of nausea, but I do not think he has  had severe emesis.   He had an x-ray done today which showed dilated colon with possible  ileus.  Based on the radiographic findings, Dr. Flonnie Overman requested surgical  consultation.   Patient does note he does drink a fair amount of alcohol.  He denies  ever having an attack like this before, although he has had numerous  admissions for detox and heavy drinking in the past.  Apparently, he has  been on benzodiazepines in the past.  [facility] has [facility] at [facility] in the past.  He has had a history of seizures upon  detox as well.   PAST MEDICAL HISTORY:  1. Gastritis.  2. History of withdrawal seizures.  3. History of heavy alcohol abuse.   PAST SURGICAL HISTORY:  He has had orthopedic surgery in the past but no  abdominal surgeries.   SOCIAL HISTORY:  Probably about a 20-pack-year history of tobacco.  He  drinks anywhere from 6-12 beers a day.  He  denies any other alcohol use.   ALLERGIES:  SULFA, IODINE, THORAZINE.   HOME MEDICATIONS:  1. Celexa 40 mg daily.  2. Propranolol 20 mg t.i.d.  3. Seroquel 50 mg q.i.d.   FAMILY HISTORY:  Negative for any major GI disorders that he can recall.   REVIEW OF SYSTEMS:  As noted, per HPI.  GENERAL:  No recent fevers,  chills, or sweats.  No recent weight gain or weight loss.  PSYCH:  As  noted above with numerous histories.  No history of any psychosis or  dementia.  EYES:  No vision or auditory changes.  Ears, heart,  respiratory, musculoskeletal, neurological, dermatologic, testicular,  endocrine, and renal otherwise negative.  HEPATIC:  No history of  cirrhosis or definite jaundice that he can recall.  No history of  pancreatitis that he can recall.  ALLERGIC:  As noted above.  HEME/LYMPH:  Negative.   PHYSICAL EXAMINATION:  VITAL SIGNS:  His current temperature is 98.7.  His pulse is in the 90s in sinus rhythm.  Systolic blood pressure is  140s to 170s.  Diastolic has been anywhere from the 90s to the 120s.  He  is satting at 95% on 3 liters nasal cannula.  He is a well-developed and well-nourished male in no acute distress.  PSYCH:  He sort of has a distant cloudy gaze.  He is oriented x3.  No  strong evidence of any dementia, psychosis, or paranoia but maybe some  mild delirium.  HEENT:  Pupils are a little injected and bloodshot but no icterus.  Pupils are equal, round and reactive to light.  Extraocular muscles are  intact.  Normocephalic and atraumatic.  His mucous membranes are dry.  Nasopharynx and oropharynx are clear.  NECK:  Supple without any masses.  Trachea is midline.  HEART:  Regular rate and rhythm.  No murmurs, rubs or gallops.  CHEST:  Clear to auscultation bilaterally.  No wheezes, rales or  rhonchi.  No pain to rib or sternal compression.  ABDOMEN:  Moderately distended but soft.  He has some mild epigastric  tenderness to palpation.  There is no umbilical  hernia.  GENITAL:  Normal male external genitalia.  There is no inguinal hernia.  He is circumcised.  RECTAL:  Refused, per patient request.  EXTREMITIES:  No clubbing, cyanosis or edema.  MUSCULOSKELETAL:  Full range of motion of shoulders, elbows, wrists as  well as hips, knee, and ankles.  Normal back.  Normal cervical,  thoracic, lumbosacral spine.  No pain on costophrenic angle compression.  SKIN:  No petechiae.  No purpura.  No telangiectasias, no spider  angiomas.  LYMPH:  No head, neck, axillary, or groin lymphadenopathy.   LABORATORY VALUES:  His white count has gone from 12 to 14 to 15.3.  His  hemoglobin is 15.8.  Electrolytes are within normal range.  Total  bilirubin is 1.4.  The rest of his liver function tests are within  normal range.  His lipase was as high as 914, most recently 300.   CT scan shows evidence of inflammation around the pancreas and  peripancreatic structures, consistent with pancreatitis.  There is no  definite necrosis.  There is no definite fluid collection.  There is no  evidence of bowel obstruction.   He has a flat KUB today which shows a lot of stool in the colon and  slightly dilated but no strong evidence of bowel obstruction, air/fluid  levels, or free air.   ASSESSMENT/PLAN:  103. A 40 year old chronic alcoholic with pancreatitis.  I am skeptical      that this is his first attack.  I discussed with him at length the      importance of abstinence from alcohol to avoid having recurrent      episodes of pancreatitis with its risks of hemorrhage, pseudocyst      formation, small bowel necrosis, and death.  He definitely will      need psychological evaluation for followup on this.  Alcohol      withdrawal.  2. Hypertension:  Controlled.  I think he needs to up his propranolol      a little bit, but I will defer to medicine on that.  3. Tobacco withdrawal:  Placed on nicotine patch p.r.n.  4. Increased activity, as tolerated.  5. There is no  surgical need for intervention/debridement at this      time.  6. IV fluid hydration:  Once  he is more stabilized, then follow.  7. I do not know if he really needs any IV antibiotics such as      imipenem, but it is reasonable to do this for 72 hours and follow.      His white count going up a little bit may be just the arc of his      pancreatitis.  Clinically, he seems to be stabilizing but will      follow expectantly.      Ardeth Sportsman, MD  Electronically Signed     SCG/MEDQ  D:  03/26/2008  T:  03/26/2008  Job:  [id number]

## 2011-05-14 NOTE — Discharge Summary (Signed)
Joe Lawrence, Joe Lawrence NO.:  [id number]   MEDICAL RECORD NO.:  [id number]          PATIENT TYPE:  IPS   LOCATION:  0305                          FACILITY:  [facility]   PHYSICIAN:  Jasmine Pang, M.D. DATE OF BIRTH:  07/17/1971   DATE OF ADMISSION:  03/17/2009  DATE OF DISCHARGE:  03/20/2009                               DISCHARGE SUMMARY   IDENTIFICATION:  This is a 40 year old single white male who was  admitted on a voluntary basis on March 17, 2009.   HISTORY OF PRESENT ILLNESS:  The patient was initially seen in the  emergency room.  The patient reports a history of drinking and  depression and suicidal thoughts.  He states he started drinking as he  was running out of his pain pills early.  He has a history of  pancreatitis and has been prescribed pain pills for the past year.  Again, he reports he is drinking to control his pain.  His sleep and  appetite has been satisfactory.  He denies any psychotic symptoms.   PAST PSYCHIATRIC HISTORY:  This is the second admission for this  patient.  The patient was here in 2006 for alcohol and over use of  Klonopin.  There is no current outpatient health treatment.  The patient  states he is a client at [facility] and they are  prescribing is Ativan.   FAMILY HISTORY:  None.  Alcohol and drug history.  Patient has been  drinking with his last drink yesterday.  Denies any seizure activity.  Denies any substance use, although admits to overusing his pain  medications and running out of his prescriptions early.   MEDICAL PROBLEMS:  History of pancreatitis.  He is treated by Dr.  Linton Rump at [facility].  Dr. Linton Rump is trying to get him into a pain  clinic.   MEDICATIONS:  Creon 1 mg t.i.d., Protonix 40 mg daily, Seroquel 150 mg  at h.s., Ativan 1 mg t.i.d., fentanyl 75 mg every 3 days, oxycodone 30  mg q.i.d., and Celexa 40 mg daily.   DRUG ALLERGIES:  None.   PHYSICAL FINDINGS:  There were no  acute physical or medical problems  noted.  The patient had a complete physical exam done at the [facility]  Emergency Department.   LABORATORY DATA:  Shows urine drug screen positive for benzodiazepines.  Salicylate level less than 4, alcohol level of 206, lipase of 20, and  glucose of 113.  WBC count was 3.9.   HOSPITAL COURSE:  Upon admission, the patient was started home  medications of Creon 1 mg p.o. t.i.d., Protonix 40 mg daily, and Celexa  40 mg daily as well as Librium detox protocol.  He was also placed on a  liquid diet with Ensure p.r.n. and Gatorade p.r.n..  On March 18, 2009,  he was inadvertently started on fentanyl patch 75 mcg by the on-call  physician who did not realize he was on a clonidine detox protocol.  This was discontinued on Monday 03/20/2009.  On March 19, 2009, Ensure  was stopped and he was started on Glucerna p.r.n.  Because  of his  pancreatitis in individual sessions, the patient was alert and oriented  x4.  He was pleasant, mildly tremulous.  His thoughts were well  organized.  There were no symptoms of psychosis or delirium.  His mood  was appropriate to the situation.  He was upset that he was placed on  the clonidine detox protocol and that opiates were not going to be  prescribed.  On March 18, 2009, the patient complained of left-sided  pain secondary to pancreatitis.  He endorsed suicidal ideation with plan  to hang self with a bed sheet secondary to pain.  Sleep was poor.  He is  unable to hold solid foods.  He appeared depressed.  On March 19, 2009,  he continued to complain of pancreatic pain.  On March 20, 2009, mental  status had improved markedly from admission status.  The patient was  less depressed, less anxious.  He was still upset about not getting his  pain medications.  He wanted to be discharged from the hospital.  He was  not anxious and withdrawal plan from his pain medications.  His affect  was consistent with mood.  There was no  suicidal or homicidal ideation.  No auditory or visual hallucinations.  No paranoia or delusions.  Thoughts were logical and goal-directed.  Thought content no predominant  theme.  Cognitive was grossly intact.  Insight was fair.  Judgment was  fair.  Impulse control was fair.  It was felt the patient was safe for  discharge today.   DISCHARGE DIAGNOSES:  Axis I:  Alcohol dependence; mood disorder, not  otherwise specified; alcohol dependence; and opiate abuse.  Axis II:  Features of personality disorder, not otherwise specified.  Axis III:  History of pancreatitis.  Axis IV:  Severe (medical problems, other psychosocial problems, burden  of chemical dependence of psychiatric illness).  Axis V:  Global assessment of functioning was 50 upon discharge.  Global  assessment of functioning was 35 upon admission.  Global assessment of  functioning highest past year was 60 to 65.   DISCHARGE PLANS:  There were no specific activity level or dietary  restrictions, plus hospital care plans.  The patient will be seen at the  [facility].  On March 21, 2009, at 9 o'clock a.m.  He will also go to  [facility] on March 21, 2009, at 9:30 a.m.  He is to talk with his  doctor about restarting his Ativan, fentanyl patch, oxycodone, and  Protonix.  He has an appointment with Dr. Linton Rump on March 23, 2009.   DISCHARGE MEDICATIONS:  Seroquel 100 mg at bedtime, Celexa 40 mg daily,  and Creon 1 mg.      Jasmine Pang, M.D.  Electronically Signed     BHS/MEDQ  D:  03/20/2009  T:  03/21/2009  Job:  [id number]

## 2011-05-14 NOTE — Discharge Summary (Signed)
NAMETARAN, HAYNESWORTH NO.:  [id number]   MEDICAL RECORD NO.:  [id number]          PATIENT TYPE:  INP   LOCATION:  1225                         FACILITY:  [facility]   PHYSICIAN:  Lucita Ferrara, MD         DATE OF BIRTH:  1971-07-14   DATE OF ADMISSION:  03/24/2008  DATE OF DISCHARGE:                               DISCHARGE SUMMARY   DATE OF DISCHARGE:  Yet to be determined.   DISCHARGE DIAGNOSES:  1. Alcohol induced pancreatitis.  2. Severe abdominal pain secondary to #1.  3. Colonic obstruction.  4. Alcohol withdrawal.  5. History of psychiatric illness and psychiatric evaluation in the      past in 2006.  Currently on Seroquel treatment.  6. History of benzodiazepine dependence.   CONSULTATIONS:  Dr. Karie Soda from surgical services.   PROCEDURES:  The patient had a CT abdomen and pelvis dated March 24, 2008, which showed trace free fluid in the pelvis and in the abdomen,  acute pancreatitis.  The patient had a KUB March 25, 2008, which showed  a colonic ileus.  The patient also had a KUB March 26, 2008, which  showed mild colonic ileus with overall little change.   BRIEF HISTORY OF PRESENT ILLNESS:  Mr. Perin is a 40 year old male  chronic alcoholic who presented to [facility] with a chief complaint of  severe abdominal pain located in the midepigastric area accompanied by  nausea and vomiting that was nonbilious.  This was exacerbated by  drinking his usual amount of beer which was a six-pack per day.  In the  emergency room he was found to have acute pancreatitis per CT scan.  His  lipase was high on clinical findings.   HOSPITAL COURSE:  1. Pancreatitis and abdominal pain.  The patient was admitted first to      the medical surgery floor and then transferred to the intensive      care unit secondary to his mental status.  He was started on IV      fluids, IV Zofran and IV Dilaudid.  In the intensive care unit he      was found to need too many  p.r.n. doses of Dilaudid thus he was      switched to the PCA pump.  We monitored his serial lipase      examination and yet on day 2 of hospital course he developed mild      colonic obstruction.  The patient was started on soapsuds enemas      and his IV hydration was increased.  2. Alcohol abuse and alcohol withdrawal.  The patient's mental status      changed drastically.  His CIWA scale to determine alcohol      withdrawal and delirium tremors was quite high.  He was thus      transferred to the intensive care unit and more strict control of      his mental status was assessed.  He was continued on Ativan      protocol and p.r.n. antipsychotics.  3. Increase in his blood pressure, hypertension  most likely secondary      to #2.  He was started on IV Lopressor p.r.n. doses and continued      on his propranolol.  His blood pressure did normalize      intermittently but is variable and keeps going high.  4. History of psychiatric disorder.  He is long-term on Seroquel and I      continued this regimen for now.  He will      eventually after his acute phase is over need a psychiatric      consultation.  5. Deep vein thrombosis prophylaxis with SCD boots and GI prophylaxis      with Protonix.   DATE OF DISCHARGE:  Yet to be determined.      Lucita Ferrara, MD  Electronically Signed     RR/MEDQ  D:  03/27/2008  T:  03/27/2008  Job:  [id number]

## 2011-05-14 NOTE — H&P (Signed)
NAMETERREON, EKHOLM NO.:  [id number]   MEDICAL RECORD NO.:  [id number]          PATIENT TYPE:  INP   LOCATION:  0106                         FACILITY:  [facility]   PHYSICIAN:  Lucita Ferrara, MD         DATE OF BIRTH:  [birth year]   DATE OF ADMISSION:  03/24/2008  DATE OF DISCHARGE:                              HISTORY & PHYSICAL   PRIMARY CARE DOCTOR:  [facility], unassigned.   The patient is a 40 year old chronic alcoholic who presents to the  [facility] with a chief complaint of severe intractable  abdominal pain located in mid gastric accompanied by nausea and  vomiting, nonbilious, non-mucusy dry heaves.  Pain described as sharp,  stabbing in nature.  He denies any hematochezia or hematemesis.  Denies  any dizziness, chest pain, shortness of breath.  His pain is aggravated  by drinking his usual amount of a six-pack of beer earlier today.  The  patient drinks chronically about a six-pack of beer per day.  He also  denies a history of previous pancreatitis, although  in review of the  medical records, it looks like he for ED visits for severe back and  abdominal pain. He does have a significant history of alcohol abuse and  seizures after detoxification. He also has a psychiatric history  significant for a post-traumatic stress disorder, generalized anxiety  disorder and panic disorder.  He also has a history of benzodiazepine  dependence.   PAST MEDICAL HISTORY:  1. Significant for as above with substance abuse.  2. Psychiatric history as above.  3. He also has some problems with his back significant for chronic      back pain.   PAST SURGICAL HISTORY:  Apparently he has had orthopedic procedures in  the past.   SOCIAL HISTORY:  Still smokes and is a heavy drinker.  Denies any drug  abuse.   ALLERGIES:  ALLERGIC TO SULFA, IODINE, AND THORAZINE.   MEDICATIONS AT HOME:  1. Celexa 40 mg daily.  2. Propranolol 20 mg three times a day.  3.  Seroquel 50 mg four times a day.   REVIEW OF SYSTEMS:  Otherwise his review of systems is generally  negative.   PHYSICAL EXAMINATION:  VITAL SIGNS:  Blood pressure is elevated at  160/111, pulse rate 70, respirations 20, temperature 97.2.  Generally speaking, the patient is in acute abdominal pain and distress.  HEENT: Sclerae anicteric.  Mucous membranes are somewhat dry.  NECK: Supple.  No JVD, no carotid bruits.  PERRLA. Extraocular muscles  are intact.  CARDIOVASCULAR:  S1-S2,  regular rate and rhythm.  No murmurs, rubs or  clicks.  ABDOMEN: Soft, but he has epigastric pain to palpation. He also has  guarding. He is not distended.  There is no hepatosplenomegaly.  NEURO:  Patient is alert and oriented x3. Cranial nerves II-XII grossly  intact.  EXTREMITIES:  No clubbing, cyanosis or edema.   Blood alcohol level was 6, lipase elevated 897. Radiological studies  show a digital chest x-ray done several days prior, on the 25th, with no  acute findings.  ASSESSMENT/PLAN:  A 40 year old with severe intractable admitted with  epigastric and abdominal pain likely secondary to pancreatitis due to  alcohol.  1. Pancreatitis and abdominal pain.  Will go ahead and admit the      patient to the Med Surg floor and keep the patient n.p.o., IV      fluids with normal saline, IV Zofran and IV Dilaudid. We will      recheck lipase and may consider CT scan in the morning versus a GI      consultation if the patient's symptoms progress.  2. Alcohol abuse, Ativan protocol.  3. Increased blood pressure is most likely secondary to pain.      Continue propranolol for now.  May decide to titrate with IV      Lopressor.  4. History of psychiatric disorder.  Continue Seroquel 50 mg q.i.d.      per home regimen.  5. Also Celexa per home regimen.  6. Deep vein thrombosis prophylaxis with Lovenox versus SED boost and      GI prophylaxis with Protonix.   I have explained plans and procedures of this  admission to the patient.  The patient understands.      Lucita Ferrara, MD  Electronically Signed     RR/MEDQ  D:  03/24/2008  T:  03/24/2008  Job:  [id number]

## 2011-05-14 NOTE — H&P (Signed)
Joe Lawrence, FANT NO.:  [id number]   MEDICAL RECORD NO.:  [id number]          PATIENT TYPE:  IPS   LOCATION:  0403                          FACILITY:  [facility]   PHYSICIAN:  Anselm Jungling, MD  DATE OF BIRTH:  07/10/1971   DATE OF ADMISSION:  03/17/2009  DATE OF DISCHARGE:                       PSYCHIATRIC ADMISSION ASSESSMENT   This is a 40 year old male, voluntarily admitted March 17, 2009.  The  patient was initially seen in the emergency room.  The patient reports a  history of drinking and depression and suicidal thoughts.  States he  started drinking as he was running out of his pain pills early.  He has  a history of pancreatitis and has been prescribed pain pills for the  past year and again he reports that he is drinking to control his pain.  His sleep and appetite have been satisfactory.  He denies any psychotic  symptoms.   PAST PSYCHIATRIC HISTORY:  This is his second admission.  The patient  was here in 2006 for alcohol and over-use of Klonopin.  No current  outpatient health treatment.  The patient states he is a client at  [facility] and they are prescribing his Ativan.   SOCIAL HISTORY:  Single male.  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  He has no children.   FAMILY HISTORY:  None.   ALCOHOL AND DRUG HISTORY:  The patient has been drinking with his last  drink yesterday.  Denies any seizure activity.  Denies any other  substance use, although admits to over-using his pain medicines and  running out of his prescriptions early.  Primary care Kinslee Dalpe is Dr.  Reche Dixon at [facility]   MEDICAL PROBLEMS:  History of pancreatitis.   MEDICATIONS LISTED:  1. Creon 1 mg t.i.d.  2. Protonix 40 mg daily.  3. Seroquel 150 mg at h.s.  4. Ativan 1 mg t.i.d.  5. Fentanyl 75 mg every 3 days.  6. Oxycodone 30 mg q.i.d.  7. Celexa 40 mg daily.   DRUG ALLERGIES:  None.   PHYSICAL EXAM:  This is a  middle-aged male.  He appears in no acute  distress, complaining of pain, fully assessed at [facility].  VITAL SIGNS:  Temperature 97.2, 91 heart rate, 20  respirations, blood pressure 133/87.   LABORATORY DATA:  Shows urine drug screen positive for benzodiazepines.  Salicylate level less than 4, alcohol level of 206.  Lipase of 20,  glucose of 113.  WBC count is 3.9.   The ER records state that EMS was called out as the patient had passed  out in a dollar store and admitting to wanting to die right now in his  statements in the emergency room.  Relapsed about a week ago.  In his ER  stay he did receive fluids and some morphine IV push, also received  Zofran IV push.   MENTAL STATUS EXAM:  The patient is fully alert, sitting on his bed,  somewhat angry that he was not going to be getting his pain medicine.  His speech  is clear and soft-spoken.  The patient's mood is depressed.  Affect is somewhat flat.  Thought process is coherent, goal directed.  He denies any suicidal thoughts.  Cognitive function intact.  Memory  appears to be intact.  Judgment and insight is poor.   AXIS I:  Alcohol dependence.  Opiate abuse, rule out dependence.  Substance-induced mood disorder.  AXIS II:  Deferred.  AXIS III:  History of pancreatitis.  AXIS IV:  Medical problems, other psychosocial problems.  AXIS V:  Current is 35.   PLAN:  Our plan is to put patient on Librium and clonidine protocol.  Laboratory data was discussed with the patient, especially the urine  drug screen.  The patient states that he has been over-using his  medications and we did discuss that we would discontinue his oxycodone,  Duragesic patch and his Ativan during his stay.  The patient reports  that he is to be going to the pain management clinic.  Recommend the  patient to follow through on that.  Encourage fluids.  Here the patient  will be in the dual-diagnosis group and a case manager will discuss  any  potential rehab programs.  His tentative length of stay at this time is  3-5 days.      Landry Corporal, N.P.      Anselm Jungling, MD  Electronically Signed    JO/MEDQ  D:  03/17/2009  T:  03/17/2009  Job:  [id number]

## 2011-05-14 NOTE — Discharge Summary (Signed)
NAME:  Joe Lawrence, Joe Lawrence NO.:  [id number]   MEDICAL RECORD NO.:  [id number]          PATIENT TYPE:  INP   LOCATION:  1501                         FACILITY:  [facility]   PHYSICIAN:  Lonia Blood, M.D.DATE OF BIRTH:  1971/06/25   DATE OF ADMISSION:  03/24/2008  DATE OF DISCHARGE:  04/19/2008                               DISCHARGE SUMMARY   INTERIM DISCHARGE SUMMARY:   PRIMARY CARE PHYSICIAN:  Dr. Reche Dixon at [facility].   Please note this is an interim discharge summary covering the final  portion of the patient's hospitalization.  For details concerning the  acute portion of hospitalization, please see interim discharge summary  dictated by critical care service.   DISCHARGE DIAGNOSES:  1. Severe alcohol related pancreatitis - resolved at discharge.  2. Splenic vein thrombosis - 6 months anticoagulation indicated at      recommendation of general surgery.  3. Alcoholism.  4. Chronic left shoulder pain with a negative MRI.  5. Chronic low back pain with only mild herniated disk at L5-S1      without evidence of cord compression or nerve impingement.  6  Pancreatitis associated hyperglycemia - resolved.  1. Normocytic anemia of acute illness - resolved past.  2. Severe alcohol withdrawal - resolved.  3. Hypertension.  4. Tobacco abuse - abstinence counseled.  5. Generalized anxiety disorder and panic disorder with previous      history of psychiatric treatment.   DISCHARGE MEDICATIONS:  1. Celexa 40 mg daily.  2. Seroquel 300 mg nightly.  3. Protonix 40 mg daily.  4. Klonopin 0.25 mg b.i.d. for 10 days then stop.  5. Duragesic patch 50 mcg per hour strength change every 3 days.  6. Provided with recommendation to discontinue from that point      forward.  7. Roxicodone 5 mg q.4h. p.r.n. breakthrough pain #30 given with no      refills - recommended discontinue use at that time.  8. Coumadin - dose to be determined at time of actual discharge.   FOLLOW  UP:  At the time of this dictation, arrangements are being made  for the patient to follow up at [facility].  At such time, the patient  will need an INR check to assure that his Coumadin dose is appropriate.  He will need to be followed on a routine basis while on Coumadin to  assure that his INR remains within the 2-2.5 range.  The target for  treatment for his splenic vein thrombosis is 6 months total of  anticoagulation therapy.  The patient has received approximately 2 weeks  of anticoagulation during his hospital stay.   CONSULTATIONS:  1. General surgery.  2. Psychiatric inpatient service.  3. [facility].   ADDITIONAL PROCEDURES:  1. Repeat CT scan of the abdomen and pelvis April 15, 2008 - improving      pancreatitis.  Developing pseudocyst inferior to the pancreatic      body measuring 4.9 cm.  No evidence of portal vein thrombosis.      Probable mid splenic vein thrombosis noted and is stable.  2. MRI of the lumbar spine April 17, 2008 - normal lumbar alignment.      Mild motion on the study.  No fracture and conus medullaris appears      normal.  Hemangioma noted in L2 vertebral body which is stable      compared to old exams.  Mild disk degeneration.  Small right-sided      disk protrusion at L5-S1 with no evidence of compression.  3. MRI of the right upper extremity/shoulder April 17, 2008 [facility] joint      effusion which is mild.  No widening of the joint or associated      coracoclavicular ligament injury.  Mild supraspinatus tendinosis.      No evidence rotator cuff tear.   ADDENDUM TO HOSPITAL COURSE:  The care of Joe Lawrence was assumed by  the [facility] Service on April 13, 2008, after prolonged  stay within the ICU under the care [facility].  For  details concerning the portion of hospital stay covered by [facility],  please see their dictated discharge summary.  For the remainder of  hospitalization, the  patient was transferred up into a regular bed.  His  diet was advanced and he tolerated this without difficulty.  Abdominal  pain was able to be well controlled with decreasing doses of narcotics.  The patient was able to be titrated off of IV narcotics on to p.o. pain  medicine alone.  Follow-up CT scan was accomplished to reevaluate the  patient's pancreas.  It  did reveal a well forming pancreatic pseudocyst  but no further complications of pancreatitis and no evidence of  pancreatic pseudoaneurysm or phlegmon.  The patient's previously noted  portal vein thrombosis appeared to have resolved, but the patient's  splenic vein thrombosis remained stable.  No other significant CT scan  findings were appreciated.  Anticoagulation was continued and the  patient was titrated from heparin drip to Lovenox and Coumadin and will  be discharged on Coumadin alone.   During the patient's recovery phase, he also began to complain of severe  right shoulder pain and low back pain.  He admitted that these were  chronic injuries but felt that the pain had increased significantly  during his hospital stay.  MRIs were accomplished of these areas and  revealed no acute findings.   The patient was counseled extensively during the recovery phase of his  hospital stay as to the direct connection between alcoholism and his  severe acute illness and the fact that it had almost resolved.  The  patient was adamant that he would carry out his own plans to abstain  from alcohol.  He reported that he had disconnected from his drinking  friends and that he intended to at least attend AA and to possibly  pursue treatment within [facility].  Again, the patient vehemently  refused any further assistance with this issue.   By April 18, 2008, the patient was stable.  He was tolerating a regular  diet without difficulty.  His INR was 1.7 with ongoing Lovenox and  Coumadin therapy.  It is our hope that with 24 hours  more of Coumadin  therapy, that his INR will be greater than 9.  At such time, we will  discontinue his Lovenox, arrange for his follow-up at [facility], and  the patient will be discharged from the hospital.  Of note, the patient  is being discharged with a short course of narcotic pain medications in  the form of a transdermal patch as  well as p.o. dosing.  Additionally,  he is being given a short taper of Klonopin.  It is recommended that  these medications not be continued on a chronic basis.      Lonia Blood, M.D.  Electronically Signed     JTM/MEDQ  D:  04/18/2008  T:  04/18/2008  Job:  [id number]   cc:   Melvern Banker  Fax: [phone]

## 2011-05-14 NOTE — Discharge Summary (Signed)
NAMEDERRELL, MILANES NO.:  [id number]   MEDICAL RECORD NO.:  [id number]          PATIENT TYPE:  INP   LOCATION:  5511                         FACILITY:  [facility]   PHYSICIAN:  Beckey Rutter, MD  DATE OF BIRTH:  [birth year]   DATE OF ADMISSION:  12/21/2008  DATE OF DISCHARGE:  12/27/2008                               DISCHARGE SUMMARY   PRIMARY CARE PHYSICIAN:  Joe Kid. Reche Dixon, MD   CHIEF COMPLAINT:  Abdominal pain.   BRIEF HISTORY OF PRESENT ILLNESS AND HOSPITAL COURSE:  Joe Lawrence is  a 40 year old pleasant Caucasian male who has a history of pancreatitis  in March/April 2009.  He presented to the emergency department with  abdominal pain.  The patient's lipase is not elevated and the CT scan  was not indicative of acute pancreatitis at this time.  Nevertheless,  the patient continued to have severe abdominal pain required fentanyl  patch and OxyIR four times a day as well as Dilaudid intravenously to  control his abdominal pain.   The patient was seen by GI for evaluation.  The patient was seen by Dr.  Bosie Clos previously for an endoscopic evaluation in the office and for  that reason [facility] was contacted for the consultation.  The current  recommendation is to switch to non-enteric-coated pancreatic enzymes  prior to meals and to refer the patient to tertiary care.  The patient  was provided the number of Dr. Harrold Donath Share who is at [facility] at [phone].  The appointment is set for January 02, 2009 at 1:45.   I also referred the patient to Pain Clinic since he is requiring  increasing dose of narcotic, the number is [phone].  Prescription for a  fentanyl patch 75 mcg is given as well as oxycodone IR 30 mg p.o. q.6  hours #24.  This medication should cover the patient for 6 days from the  discharge day.  By that time, he should be following up with the Pain  Clinic for further evaluation as  well as the [facility].   HOSPITAL PROCEDURES:  Labs:  1. Lipase on December 24, 2008 is 12, lipase on December 23, 2008 is      also 12, amylase on December 23, 2008 is 85.  Sodium is 140,      potassium 3.9, chloride is 105, bicarb is 30, glucose is 112, BUN      is 3 and creatinine 0.78.  White blood count is 3.7, hemoglobin is      12.6, hematocrit is 38.8 and platelet count is 164.   CT abdomen and pelvis on December 23, 2008 impression is;  1. Interval resolution of focal pancreatic enlargement.  This likely      reflects resolution of pancreatitis.  2. Scattered small lymph nodes mesentery likely reactive.  These are      not pathologically enlarged.  3. Linear atelectasis at the lung bases bilaterally.   The pelvis CT scan on December 23, 2008 is showing;  1. Moderate amount of stool throughout the colon.  2. No other acute abnormalities  of the pelvis.   DISCHARGE DIAGNOSES:  1. History of severe pancreatitis due to alcohol 9 months ago.  2. Chronic abdominal pain.  3. Apparent discrepancy between subjective pain and objective      manifestation or abnormalities.   DISCHARGE MEDICATIONS:  The patient will be discharged on  1. Fentanyl patch 75 mcg every 3 days.  Prescription for two were      given.  2. Oxycodone IR 30 mg p.o. q.6 h. p.r.n. #24.  3. Citalopram 40 mg daily in the morning.  4. Lorazepam 1 mg in the a.m. and in the evening.  5. Lorazepam 0.5 mg at bedtime.  6. Seroquel 300 mg at bedtime.  7. Creon 1 capsule with vitamin.  It is preferred the patient to      switch to non-enterogenic coated pancreatic enzymes.  8. Omeprazole 40 mg daily.  9. MiraLax 17 g p.o. daily in 4-6 ounces of liquid.   DISCHARGE PLAN:  As discussed above the patient should follow up with  the [facility], the  number is [phone] and with the Pain Clinic, the number is [phone].   I had a lengthy discussion in more than one  occasion with the patient in  regards to the plan of care.  He is aware and agreeable of the  discharge plan now.      Beckey Rutter, MD  Electronically Signed     EME/MEDQ  D:  12/27/2008  T:  12/28/2008  Job:  [id number]   cc:   Joe Lawrence, M.D.

## 2011-05-14 NOTE — Consult Note (Signed)
NAMEMINORU, CHAP NO.:  [id number]   MEDICAL RECORD NO.:  [id number]          PATIENT TYPE:  INP   LOCATION:  5511                         FACILITY:  [facility]   PHYSICIAN:  Bernette Redbird, M.D.   DATE OF BIRTH:  Jan 14, 1971   DATE OF CONSULTATION:  12/25/2008  DATE OF DISCHARGE:                                 CONSULTATION   HISTORY OF PRESENT ILLNESS:  Dr. Tamsen Roers of the [facility]  asked [place] to see this 40 year old unassigned patient followed at  [facility] and seen once in the office by my partner, Dr. Bosie Clos, to  set up endoscopic evaluation at Dr. Benjaman Lobe request, and once for  outpatient endoscopy.   This month, we are not on for [facility] unassigned patients, but because  of some degree of ambiguity as to whether or not he is a regular patient  of Dr. Bosie Clos, I went ahead and saw the patient today.   Basically, this patient had life-threatening pancreatitis back in March  and April of this year, where he spent almost a month in the hospital.  He was not seen by Gastroenterology on that occasion.  He was placed on  Coumadin for splenic vein thrombosis.  His chronic pain was managed with  fentanyl 75 mcg patch every 3 days and OxyIR 30 mg four times a day.  Several months ago, however, he began having significant breakthrough  pain and he started using up his medicines too soon and thus presented  to the hospital several days ago in severe pain and was admitted.   The patient has a significant history of alcohol abuse in the past as  well as apparently issues with benzodiazepines, his lipase was normal  and his CT on this admission was also negative for any pancreatic  inflammatory changes or evidence of chronic pancreatitis such as  calcifications or ductal dilatation, so there was some uncertainty as to  whether or not the patient had true abdominal pain or was simply seeking  pain medications.   He was referred to Dr. Bosie Clos some  months ago for the express purpose  of endoscopic evaluation to rule out an alternative source of upper  abdominal pain and apparently the endoscopy was negative, although I do  not currently have those records.   ALLERGIES:  IODINE (blisters), SULFA, and THORAZINE.   OUTPATIENT MEDICATIONS:  Are as mentioned above.  He also uses Creon 1  capsule prior to meals, Celexa, Seroquel, lorazepam, and omeprazole.   PAST MEDICAL HISTORY:  He has had no operations.  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   HABITS:  Per above.   FAMILY HISTORY:  Not obtained.   SOCIAL HISTORY:  He had been living with his mother, but now is living  in a different arrangement where he works a few hours a week in return  for a small apartment.  He is a Carrington negative.   REVIEW OF SYSTEMS:  He notes  a roughly 20-pound weight loss from  baseline over the past 6 months.  He denies recent alcohol use, although  he had a minimally positive alcohol level on admission which could  conceivably be metabolic in origin, tends to run constipated, no history  of malabsorptive type diarrhea stools.  His pain is primarily in the  left upper quadrant and across the upper abdomen, seems to be made worse  by meals and by activity, but it is constantly present if he does not  have medication.   PHYSICAL EXAMINATION:  GENERAL:  This is a pleasant articulate Caucasian  male who appears thin but not cachectic, anicteric, and without pallor.  CHEST:  Clear.  HEART:  Normal.  ABDOMEN:  Without any objective tenderness or even any guarding or  evident subjective tenderness.  No mass effect.   Of note, the patient seems completely comfortable in the bed, he is able  to sit up and down in the bed  without evident pain.  During the course  of the interview, he becomes a little bit jittery and anxious in  appearance, particularly when discussing pain medications.   LABORATORY DATA:  White count 37,100, hemoglobin 12.6, MCV 90.  Liver  chemistries completely normal.  Platelets 164,000, albumin 3.3.  Lipase  has been repeatedly completely normal during his admission, as have his  amylase level.   IMPRESSION:  1. History of severe pancreatitis due to alcohol approximately 9      months ago.  2. Chronic pain.  3. Apparent discrepancy between subjective pain and objective      manifestations or abnormalities.   DISCUSSION:  I assure the hospitalists skepticism as to whether or not  this patient may be exhibiting pain medication seeking behavior.  If his  complain of pain is genuine, it is not currently either radiographically  or biochemically apparent.  However, this is still possible, since there  are chronic pancreatitis patients who seem to have intractable pain in  the absence of gross radiographic abnormalities or ongoing enzyme  elevation.   RECOMMENDATIONS:  1. Obtain records from Dr. Bosie Clos.  2. Try to get clarification from Dr. Bosie Clos as to whether we will be      following this patient on an ongoing basis or whether he should be      considered unassigned.  3. Consider endoscopic ultrasound examination to further define the      pancreatic parenchyma and ductal anatomy.  4. Consider switching to nonenteric-coated pancreatic enzymes prior to      meals, inasmuch as this is the form of medication that has been      associated with effectiveness in some patients with chronic      pancreatitis in relieving their pain.  5. Consider referral to a tertiary care medical center for assessment      and management of chronic pancreatitis and associated pain, with      particular reference to whether or not he would be a candidate for      some sort of surgical intervention  such as a [place] procedure.  6. Especially if no surgically correctable cause of his pain can be      identified, consider referral to a Pain Clinic for management of      his pain medications.          ______________________________  Bernette Redbird, M.D.    RB/MEDQ  D:  12/25/2008  T:  12/26/2008  Job:  [id number]   cc:  Monia Sabal Jobe Igo, M.D.

## 2011-05-14 NOTE — Consult Note (Signed)
NAME:  Joe [facility], Joe Lawrence NO.:  [id number]   MEDICAL RECORD NO.:  [id number]          PATIENT TYPE:  INP   LOCATION:  1225                         FACILITY:  [facility]   PHYSICIAN:  Anselm Jungling, MD  DATE OF BIRTH:  1971-10-30   DATE OF CONSULTATION:  DATE OF DISCHARGE:                                 CONSULTATION   IDENTIFYING DATA AND REASON FOR REFERRAL:  The patient is a 40 year old  male currently under the care of Dr. Sherene Sires, here at [facility].  He is admitted for treatment of alcoholic pancreatitis and other  multiple medical problems.  Psychiatric consultation is requested to  assess mental status and make recommendations.   HISTORY OF THE PRESENTING PROBLEMS:  The patient has a long history of  alcohol and benzodiazepine dependence.  He has also been under  psychiatric care, although it is not clear for what diagnosis, or who  his current outpatient Kailee Essman is.   He was admitted to [facility], due to severe pancreatitis.  He  was admitted approximately 3 weeks ago, or slightly less, and therefore  has at this point, gone through any alcohol detoxification and  withdrawal that would have occurred.  However, he continues in the  intensive care unit, as his medical problems are being addressed and  stabilized.   CURRENT PSYCHOTROPIC MEDICATIONS:  Include Klonopin 2 mg q.8 h.,  Seroquel 150 mg q.a.m., and 200 mg q.h.s., Depakote 250 mg b.i.d.  In  addition, he is receiving a clonidine TTS patch 0.2 mg, Celexa 40 mg  daily, fentanyl patch, and, as needed Haldol p.r.n. agitation.   Nursing staff did discuss the patient with me, indicated that he appears  to be somnolent quite a lot during the daytime.  At nighttime, he tends  to become active, and sometimes agitated.  He is often given p.r.n.  medication during the night, which settles him down.   I note in E-Chart that the patient was hospitalized at our inpatient  psychiatric facility  in June 2006, where he underwent alcohol  detoxification, and experienced a seizure, following his detoxification.   The patient's alcohol and benzodiazepine dependency problems are  apparently very longstanding, chronic, with apparently few periods of  sobriety.   It is not clear to me to what extent he has any helpful supports  available.   There is a clinical social work note indicating that there is been some  contact with the patient's sister and mother.  There has been some  discussion already of aftercare and the possibility of further substance  abuse treatment.  Apparently, information was given to the family  regarding the [facility].   MENTAL STATUS AND OBSERVATIONS:  The patient is in his ICU bed.  He is  very difficult to rouse, and when I am able to rouse him, he is only  able to keep his eyes open briefly, and he makes some weak attempts to  speak, but does not to actually make any coherent statements.  He  quickly falls back asleep.  (The time is 9:00 a.m.).   IMPRESSION:  The patient  clearly has a long history of alcohol and  benzodiazepine dependence.  In addition, he has other medical issues  including insulin-dependent diabetes mellitus.  He has apparently  suffered many psychosocial, as well as physical consequences of his long-  term alcohol abuse and dependence.   At this time, he has undoubtedly gone through whatever alcohol  withdrawal would have taken place, as he has been here about 19 days.  However, he has a history of post-detoxification seizure, and may still  be at risk for this.   DIAGNOSTIC IMPRESSION:  Axis I:  Alcohol dependence, chronic, severe and  benzodiazepine dependence, chronic, severe.  Substance-induced mood  disorder.  Axis II:  Deferred.  Axis III:  Alcoholic pancreatitis, insulin-dependent diabetes mellitus,  gastroesophageal reflux disease, gastritis.  Axis IV:  Stressors severe.  Axis V:  GAF  35.   RECOMMENDATIONS:  At this point, it appears that the patient may be  ready for a reduction in sedative and depressant medications.  I would  first reduce Klonopin, which he is now receiving at a total of 6 mg a  day, which is a large amount.  To minimize the risk of seizure, it would  be best to gradually reduce the Klonopin x 0.5 mg q.8 h. every other  day.  That is, at this point, he could be reduced to 1.5 mg q.8 h.  In 2  days, he can be reduced to 1 mg q.8 h., and so on.   Given the report that he is more agitated at night, and more somnolent  during the day, it may make sense to shift his morning Seroquel to h.s.,  so that rather than taking 1:50 a.m. and 200 at bedtime., that he is  receiving 300 mg of Seroquel at bedtime.  I believe that this would be  more appropriate, help his nighttime settling, and reduced daytime  sedation.   I would continue Depakote 250 mg b.i.d.  Depakote is normally given for  bipolar disorder, and although I know of no evidence that this patient  has a bipolar disorder, it would be prudent to continue Depakote,  because of the patient's seizure history.   We also might look at the patient's clonidine and fentanyl regimen.  Clonidine of course can be quite sedating, especially in combination  with the other CNS depressants that he is receiving.  Also, although the  patient undoubtedly has significant pain and warrants medication such as  fentanyl, it would be a good idea to make sure we are not giving him  more than he really requires for adequate pain relief.   It appears that the patient will be hospitalized for at least several  days further.  When his mental status is characterized by less  somnolence, and more ability to converse, I can return and interact with  the patient in such a way as to try to develop with him, and possibly  his family, a reasonable aftercare plan that can address his medical and  chemical dependency treatment  needs.  I will look to social work to be  involved in this process, as well.   Thank you for involving me in this patient's care.  Cell phone:  [phone].      Anselm Jungling, MD  Electronically Signed     SPB/MEDQ  D:  04/12/2008  T:  04/12/2008  Job:  [id number]

## 2011-05-17 NOTE — Discharge Summary (Signed)
NAMEREVANTH, NEIDIG NO.:  [id number]   MEDICAL RECORD NO.:  [id number]          PATIENT TYPE:  EMS   LOCATION:  ED                           FACILITY:  [facility]   PHYSICIAN:  Hettie Holstein, D.O.    DATE OF BIRTH:  [birth year]   DATE OF ADMISSION:  06/07/2008  DATE OF DISCHARGE:  06/07/2008                               DISCHARGE SUMMARY   ADDENDUM:  Addendum to a discharge summary as dictated by Dr. Jetty Duhamel.  Please refer to and append to job number [id number]. This is to  address the medication Coumadin at time of discharge. The patient was  instructed to take Coumadin 5 mg daily until he follows up with  [facility]. He had an appointment with [facility] on April 22 at 3:00  p.m. to adjust his Coumadin and also to follow up with Dr. Reche Dixon on  April 28, 2008 at 11:30 a.m.         Hettie Holstein, D.O.  Electronically Signed     ESS/MEDQ  D:  06/15/2008  T:  06/15/2008  Job:  [id number]

## 2011-05-17 NOTE — Discharge Summary (Signed)
NAMEMARCEL, Joe NO.:  [id number]   MEDICAL RECORD NO.:  [id number]          PATIENT TYPE:  IPS   LOCATION:  0300                          FACILITY:  [facility]   PHYSICIAN:  [facility], M.D.      DATE OF BIRTH:  11-Mar-1971   DATE OF ADMISSION:  06/07/2005  DATE OF DISCHARGE:  06/17/2005                                 DISCHARGE SUMMARY   CHIEF COMPLAINT AND PRESENT ILLNESS:  This was the first admission to [facility] for this 40 year old white male, single, voluntarily  admitted.  Presented to the emergency room requesting detox from Klonopin  and alcohol.  Alcohol level was 278.  Relapsed two weeks prior to this  admission.  Getting Klonopin from [facility] and overused it.  History of  benzodiazepine abuse and dependence.  Drinking 12-14 beers daily.  Longest  sober 2-1/2 years with no medications.  Endorsed anxiety, taking multiple  SSRIs and not working.  History of two or three prior suicide attempts.   PAST PSYCHIATRIC HISTORY:  Getting Klonopin with [facility].  First time at  [facility].  History of multiple admissions.  Last at [facility]  several months ago.  Also at [facility].  History of seizures upon  detox.  Had taken Zoloft, Paxil, Lexapro and Klonopin.   ALCOHOL/DRUG HISTORY:  As already stated, persistent use of alcohol.  No  recent significant sobriety.  Multiple attempts to detox.  Also abuse and  dependence on benzodiazepines.   MEDICAL HISTORY:  History of seizures five days after detox, history of  gastritis.   MEDICATIONS:  Klonopin 1 mg, 4-5 per day.   PHYSICAL EXAMINATION:  Performed and failed to show any acute findings.   LABORATORY DATA:  Blood chemistry within normal limits.  Liver enzymes with  SGOT 25, SGPT 22, total bilirubin 1.5.  Lipid profile with cholesterol 154,  triglycerides 102.  TSH 2.801.   MENTAL STATUS EXAM:  Fully alert male.  Anxious, disheveled.  __________  cooperative.  Speech  normal tone but some pressure.  Candid about his use.  Mood anxiety.  Affect anxiety.  Thought processes somewhat disorganized,  circumstantial, derailing, hard time keeping his thoughts organized.  No  suicidal or homicidal ideation.  Wanted to pursue long-term abstinence.   ADMISSION DIAGNOSES:   AXIS I:  1.  Alcohol dependence.  2.  Benzodiazepine abuse.   AXIS II:  No diagnosis.   AXIS III:  1.  Seizures after detox.  2.  Gastritis.   AXIS IV:  Moderate.   AXIS V:  Global Assessment of Functioning upon admission 25-30; highest  Global Assessment of Functioning in the last year 65.   [facility] COURSE:  He was admitted.  He was started in individual and group  psychotherapy.  He was initially placed on Librium.  The Librium was not  able to hold his detox, so he was switched to phenobarbital.  He experienced  difficulty with his blood pressure.  Evidencing hypertension every time we  were trying to decrease the Librium or the phenobarbital.  Blood pressure  escalated.  Eventually,  we had to treat it.  We consulted internal medicine.  They suggested doing clonidine and hydrochlorothiazide.  He evidenced  improvement on these medications.  Initially endorsed that he relapsed on  alcohol and benzodiazepines.  Had made attempts to abstain but he had  switched alcohol for Klonopin and then started using an increased amount of  Klonopin and then also drinking.  Very worried, because he had seizures two  days after detox.  Afraid to go off and have seizures again.  Continued to  have a difficult time with the detox.  Blood pressure would escalate due to  persistent worrying.  He was not feeling well.  Became more anxious about  it.  His blood pressure continued to escalate.  Blood pressure continued to  fluctuate.  Sleep became a major issue.  Worrying, ruminating.  Endorsed  that he saw the person who jumped from a bridge and killed himself in  [place].  [place] he was in  an [employer].  Heard the noise and  when he and the other [facility] members looked outside, they saw the corpse on the  pavement with all the blood.  Endorsed that he had thought about jumping  himself from that bridge.  Had thoughts and had a hard time taking this out  of his mind.  Very labile, very tearful, frustrated with himself with his  persistent relapses.  Anxiety, insomnia.  Worrying a lot about his physical  condition due to the fact that he had the seizure.  We continued to detox.  Modified the detox accordingly.  Gradually he started getting better.  His  blood pressure was more stable.  The anxiety was under better control.  On  June 19th, he was in full contact with reality.  The Seroquel was helping  with the anxiety.  The blood pressure was under control.  Overall feeling  better.  He was sleeping through the night.  Committed to abstinence,  especially given the fact that this last detox was so difficult.   DISCHARGE DIAGNOSES:   AXIS I:  1.  Alcohol dependence.  2.  Anxiety disorder not otherwise specified with post-traumatic stress      disorder, generalized anxiety disorder and panic features.  3.  Benzodiazepine dependence.   AXIS II:  No diagnosis.   AXIS III:  1.  Seizure after detox.  2.  Gastritis.  3.  Arterial hypertension.   AXIS IV:  Moderate.   AXIS V:  Global Assessment of Functioning upon discharge 50.   DISCHARGE MEDICATIONS:  1.  Seroquel 50 mg twice a day and at night.  2.  Hydrochlorothiazide 25 mg per day.  3.  Clonidine 0.1 mg in the morning and 2 at bedtime.   FOLLOW UP:  [facility].  He was going to interview with  [facility] for long-term treatment program.       IL/MEDQ  D:  07/09/2005  T:  07/10/2005  Job:  [id number]
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
## 2011-06-07 ENCOUNTER — Other Ambulatory Visit: Payer: Self-pay | Admitting: Medical

## 2011-06-07 MED ORDER — QUETIAPINE FUMARATE 50 MG PO TABS: 50.0000 mg | ORAL_TABLET | Freq: Every day | ORAL | Status: DC

## 2011-06-10 ENCOUNTER — Ambulatory Visit (INDEPENDENT_AMBULATORY_CARE_PROVIDER_SITE_OTHER): Payer: Medicare Other | Admitting: Medical

## 2011-06-10 ENCOUNTER — Encounter: Payer: Self-pay | Admitting: Medical

## 2011-06-10 VITALS — BP 120/80 | HR 68 | Temp 98.2°F | Ht 70.0 in | Wt 171.0 lb

## 2011-06-10 DIAGNOSIS — F17200 Nicotine dependence, unspecified, uncomplicated: Secondary | ICD-10-CM

## 2011-06-10 DIAGNOSIS — R062 Wheezing: Secondary | ICD-10-CM

## 2011-06-10 DIAGNOSIS — J329 Chronic sinusitis, unspecified: Secondary | ICD-10-CM

## 2011-06-10 DIAGNOSIS — J40 Bronchitis, not specified as acute or chronic: Secondary | ICD-10-CM

## 2011-06-10 MED ORDER — AMOXICILLIN 875 MG PO TABS: 875.0000 mg | ORAL_TABLET | Freq: Two times a day (BID) | ORAL | Status: AC

## 2011-06-10 MED ORDER — QUETIAPINE FUMARATE 50 MG PO TABS: 50.0000 mg | ORAL_TABLET | Freq: Every day | ORAL | Status: DC

## 2011-06-10 MED ORDER — PREDNISONE 20 MG PO TABS: ORAL_TABLET | ORAL | Status: DC

## 2011-06-10 MED ORDER — ALBUTEROL SULFATE HFA 108 (90 BASE) MCG/ACT IN AERS: 2.0000 | INHALATION_SPRAY | Freq: Four times a day (QID) | RESPIRATORY_TRACT | Status: DC | PRN

## 2011-06-10 NOTE — Progress Notes (Signed)
Subjective:     Joe Lawrence is a 40 y.o. male who presents for evaluation of productive cough and sinus pressure.  Symptoms include chest and head congestion, facial pain, low grade fever, nasal congestion, sinus pressure and wheezing. Onset of symptoms was 10 days ago, and has been gradually worsening since that time. Treatment to date: decongestants.  +sick contacts.  He is a smoker.  No other aggravating or relieving factors.  No other c/o.  The following portions of the patient's history were reviewed and updated as appropriate: allergies, current medications, past family history, past medical history, past social history, past surgical history and problem list.  Past Medical History  Diagnosis Date  . Chronic pancreatitis   . Anxiety   . Tobacco abuse     Review of Systems Constitutional: denies chills, sweats, anorexia Skin: denies rash HEENT: + ear pain,denies  itchy watery eyes Cardiovascular: denies chest pain, palpitations Lungs: +mild SOB, +productive sputum, wheezing, denies hemoptysis, orthopnea, PND Abdomen: denies abdominal pain, nausea, vomiting, diarrhea GU: denies dysuria Extremities: denies edema, myalgias, arthralgias MSK: generalized aches PE/DVT risk factors - denies recent surgery, travel, stasis, hx/o cancer, no hx/o DVT/PE  Objective:   Filed Vitals:   06/10/11 1618  BP: 120/80  Pulse: 68  Temp: 98.2 F (36.8 C)    General appearance: Alert, WD/WN, no distress, ill appearing                             Skin: warm, no rash, no diaphoresis                           Head: + sinus tenderness                            Eyes: conjunctiva normal, corneas clear, PERRLA                            Ears: pearly TMs, external ear canals normal                          Nose: septum midline, turbinates swollen, with erythema and clear discharge             Mouth/throat: MMM, tongue normal, mild pharyngeal erythema                           Neck: supple, no  adenopathy, no thyromegaly, nontender                          Heart: RRR, normal S1, S2, no murmurs                         Lungs: +bronchial breath sounds, +scattered wheezes, no rhonchi, no rales                Extremities: no edema, nontender, no calve tenderness     Assessment:   Encounter Diagnoses  Name Primary?  . Wheezing Yes  . Sinusitis   . Bronchitis   . Tobacco use disorder     Plan:   Prescription given today for Amoxicillin, Proair, and Prednisone as below.  Discussed diagnosis and treatment of sinusitis and bronchitis.  Suggested symptomatic OTC remedies  for cough and congestion.  Tylenol or Ibuprofen OTC for fever and malaise.  Given his wheezing today, will send for CXR.  Advised he stop tobacco.  Discussed risks of tobacco use.

## 2011-06-10 NOTE — Patient Instructions (Addendum)
Go for Chest xray, and we will call with results.

## 2011-06-11 ENCOUNTER — Ambulatory Visit
Admission: RE | Admit: 2011-06-11 | Discharge: 2011-06-11 | Disposition: A | Discharge status: Home / Self Care | Payer: Medicare Other | Source: Ambulatory Visit | Attending: Medical | Admitting: Medical

## 2011-06-11 ENCOUNTER — Telehealth: Payer: Self-pay | Admitting: *Deleted

## 2011-06-11 NOTE — Telephone Encounter (Addendum)
Message copied by Dorthula Perfect on Tue Jun 11, 2011  3:25 PM ------      Message from: Jac Canavan      Created: Tue Jun 11, 2011  2:36 PM       Chest xray is ok, no mass or pneumonia.  Lets continue same plan, and have him recheck in 1wk on the wheezing.  Call this week if not improving by Friday though.   Pt notified of chest xray.  Pt stated he just started taking medications this morning.  Will recheck in 1 week on the wheezing.  CM, LPN

## 2011-06-14 ENCOUNTER — Telehealth: Payer: Self-pay | Admitting: Family Medicine

## 2011-06-14 MED ORDER — LEVOFLOXACIN 500 MG PO TABS: 500.0000 mg | ORAL_TABLET | Freq: Every day | ORAL | Status: AC

## 2011-06-14 NOTE — Telephone Encounter (Signed)
Tell him that I called in a different antibiotic

## 2011-06-14 NOTE — Telephone Encounter (Signed)
Called pt and let him know new med called in

## 2011-06-14 NOTE — Telephone Encounter (Signed)
Patient switched to Levaquin do to GI irritation from Amoxil

## 2011-09-15 ENCOUNTER — Other Ambulatory Visit: Payer: Self-pay | Admitting: Family Medicine

## 2011-09-17 ENCOUNTER — Telehealth: Payer: Self-pay | Admitting: Family Medicine

## 2011-09-17 MED ORDER — QUETIAPINE FUMARATE 50 MG PO TABS: 50.0000 mg | ORAL_TABLET | Freq: Every day | ORAL | Status: DC

## 2011-09-17 NOTE — Telephone Encounter (Signed)
Seroquel renewed.

## 2011-09-19 LAB — BASIC METABOLIC PANEL
CO2: 28
Chloride: 95 — ABNORMAL LOW
Creatinine, Ser: 0.79
GFR calc Af Amer: >60
Potassium: 4.1
Sodium: 135

## 2011-09-19 LAB — CBC
HCT: 41.7
Hemoglobin: 14.7
MCHC: 35.3
MCV: 89.5
RBC: 4.66
WBC: 4.5

## 2011-09-19 LAB — DIFFERENTIAL
Basophils Relative: 0
Eosinophils Absolute: 0.2
Eosinophils Relative: 5
Lymphs Abs: 1.5
Monocytes Absolute: 0.7
Monocytes Relative: 15 — ABNORMAL HIGH

## 2011-09-19 LAB — ETHANOL: Alcohol, Ethyl (B): 197 — ABNORMAL HIGH

## 2011-09-19 LAB — RAPID URINE DRUG SCREEN, HOSP PERFORMED
Barbiturates: NOT DETECTED
Benzodiazepines: NOT DETECTED

## 2011-09-23 LAB — BLOOD GAS, ARTERIAL
Acid-Base Excess: 0.1
Acid-Base Excess: 0.1
Acid-base deficit: 0.1
Acid-base deficit: 1.2
Bicarbonate: 24
Drawn by: 129801
Drawn by: 129801
Drawn by: 229971
Drawn by: 229971
FIO2: 0.1
FIO2: 0.6
MECHVT: 440
MECHVT: 600
O2 Content: 4
O2 Saturation: 94.7
O2 Saturation: 98.9
O2 Saturation: 99
PEEP: 8
Patient temperature: 98.6
Patient temperature: 98.6
Patient temperature: 98.6
Patient temperature: 98.6
Patient temperature: 98.6
RATE: 14
RATE: 24
RATE: 24
TCO2: 21.9
TCO2: 22.7
pCO2 arterial: 39.7
pH, Arterial: 7.399
pO2, Arterial: 106 — ABNORMAL HIGH
pO2, Arterial: 122 — ABNORMAL HIGH
pO2, Arterial: 63.9 — ABNORMAL LOW

## 2011-09-23 LAB — URINALYSIS, ROUTINE W REFLEX MICROSCOPIC
Bilirubin Urine: NEGATIVE
Ketones, ur: NEGATIVE
Nitrite: NEGATIVE
Urobilinogen, UA: 0.2
pH: 8

## 2011-09-23 LAB — POCT I-STAT, CHEM 8
Chloride: 98
HCT: 50
Potassium: 4.1

## 2011-09-23 LAB — DIFFERENTIAL
Basophils Absolute: 0
Basophils Absolute: 0
Basophils Absolute: 0.1
Basophils Relative: 0
Basophils Relative: 1
Lymphocytes Relative: 12
Lymphocytes Relative: 11 — ABNORMAL LOW
Lymphs Abs: 1.1
Neutro Abs: 9.8 — ABNORMAL HIGH
Neutro Abs: 6
Neutro Abs: 8.1 — ABNORMAL HIGH
Neutrophils Relative %: 77
Neutrophils Relative %: 69
Neutrophils Relative %: 78 — ABNORMAL HIGH

## 2011-09-23 LAB — CBC
HCT: 33 — ABNORMAL LOW
HCT: 46
HCT: 46.1
HCT: 46.2
Hemoglobin: 11.8 — ABNORMAL LOW
Hemoglobin: 12.6 — ABNORMAL LOW
Hemoglobin: 15.8
Hemoglobin: 16.1
Hemoglobin: 16.3
MCHC: 34.4
MCHC: 35.4
MCHC: 35.4
MCHC: 35.9
MCHC: 34.5
MCHC: 35.1
MCV: 89.6
MCV: 90.5
MCV: 91.5
MCV: 91.5
Platelets: 140 — ABNORMAL LOW
Platelets: 155
Platelets: 310
RBC: 3.64 — ABNORMAL LOW
RBC: 4.01 — ABNORMAL LOW
RBC: 5.09
RBC: 5.05
RDW: 12.9
RDW: 13.4
RDW: 13.8
RDW: 13
WBC: 10.7 — ABNORMAL HIGH
WBC: 11.1 — ABNORMAL HIGH

## 2011-09-23 LAB — COMPREHENSIVE METABOLIC PANEL
ALT: 26
ALT: 41
AST: 23
AST: 25
AST: 42 — ABNORMAL HIGH
Albumin: 1.9 — ABNORMAL LOW
Albumin: 2.7 — ABNORMAL LOW
Albumin: 3.9
Alkaline Phosphatase: 80
Alkaline Phosphatase: 82
BUN: 13
BUN: 15
BUN: 5 — ABNORMAL LOW
BUN: 7
CO2: 23
CO2: 25
CO2: 30
Calcium: 7.4 — ABNORMAL LOW
Calcium: 7.9 — ABNORMAL LOW
Calcium: 8 — ABNORMAL LOW
Calcium: 9.6
Chloride: 102
Chloride: 105
Chloride: 107
Creatinine, Ser: 0.78
Creatinine, Ser: 0.87
Creatinine, Ser: 1.06
Creatinine, Ser: 0.91
GFR calc Af Amer: >60
GFR calc Af Amer: >60
GFR calc Af Amer: >60
GFR calc Af Amer: >60
GFR calc non Af Amer: >60
GFR calc non Af Amer: >60
GFR calc non Af Amer: >60
GFR calc non Af Amer: >60
Glucose, Bld: 101 — ABNORMAL HIGH
Glucose, Bld: 122 — ABNORMAL HIGH
Glucose, Bld: 133 — ABNORMAL HIGH
Glucose, Bld: 83
Potassium: 4.2
Potassium: 4.7
Sodium: 138
Sodium: 139
Sodium: 140
Total Bilirubin: 1.1
Total Bilirubin: 1.1
Total Bilirubin: 1.7 — ABNORMAL HIGH
Total Protein: 5.2 — ABNORMAL LOW
Total Protein: 5.8 — ABNORMAL LOW
Total Protein: 7.3

## 2011-09-23 LAB — LIPASE, BLOOD
Lipase: 141 — ABNORMAL HIGH
Lipase: 307 — ABNORMAL HIGH
Lipase: 45
Lipase: 897 — ABNORMAL HIGH
Lipase: 914 — ABNORMAL HIGH
Lipase: 18

## 2011-09-23 LAB — POCT CARDIAC MARKERS
CKMB, poc: <1 — ABNORMAL LOW
Operator id: 294501
Troponin i, poc: <0.05

## 2011-09-23 LAB — MAGNESIUM: Magnesium: 2.3

## 2011-09-24 LAB — CBC
HCT: 28.7 — ABNORMAL LOW
HCT: 29.2 — ABNORMAL LOW
HCT: 29.2 — ABNORMAL LOW
HCT: 29.9 — ABNORMAL LOW
HCT: 30.1 — ABNORMAL LOW
HCT: 30.7 — ABNORMAL LOW
HCT: 31.2 — ABNORMAL LOW
HCT: 31.6 — ABNORMAL LOW
HCT: 31.8 — ABNORMAL LOW
HCT: 32.5 — ABNORMAL LOW
HCT: 32.1 — ABNORMAL LOW
Hemoglobin: 10 — ABNORMAL LOW
Hemoglobin: 10.2 — ABNORMAL LOW
Hemoglobin: 10.2 — ABNORMAL LOW
Hemoglobin: 10.4 — ABNORMAL LOW
Hemoglobin: 10.4 — ABNORMAL LOW
Hemoglobin: 10.7 — ABNORMAL LOW
Hemoglobin: 11.1 — ABNORMAL LOW
Hemoglobin: 11.2 — ABNORMAL LOW
Hemoglobin: 10.6 — ABNORMAL LOW
Hemoglobin: 10.6 — ABNORMAL LOW
Hemoglobin: 10.8 — ABNORMAL LOW
Hemoglobin: 11 — ABNORMAL LOW
MCHC: 34.9
MCHC: 35
MCHC: 35
MCHC: 35.5
MCHC: 33.7
MCHC: 33.9
MCHC: 34
MCHC: 34.4
MCV: 90.6
MCV: 91.4
MCV: 91.7
MCV: 90.8
MCV: 90.8
MCV: 91.1
MCV: 91.1
Platelets: 175
Platelets: 340
Platelets: 515 — ABNORMAL HIGH
Platelets: 553 — ABNORMAL HIGH
Platelets: 594 — ABNORMAL HIGH
Platelets: 353
Platelets: 409 — ABNORMAL HIGH
RBC: 3.13 — ABNORMAL LOW
RBC: 3.2 — ABNORMAL LOW
RBC: 3.23 — ABNORMAL LOW
RBC: 3.23 — ABNORMAL LOW
RBC: 3.24 — ABNORMAL LOW
RBC: 3.36 — ABNORMAL LOW
RBC: 3.41 — ABNORMAL LOW
RBC: 3.43 — ABNORMAL LOW
RBC: 3.54 — ABNORMAL LOW
RBC: 3.62 — ABNORMAL LOW
RDW: 13.7
RDW: 13.8
RDW: 13.9
RDW: 14
RDW: 14.2
RDW: 14.3
RDW: 14.4
RDW: 13.4
WBC: 10.1
WBC: 12.3 — ABNORMAL HIGH
WBC: 12.8 — ABNORMAL HIGH
WBC: 8.3
WBC: 8.9
WBC: 7.4
WBC: 8.9

## 2011-09-24 LAB — COMPREHENSIVE METABOLIC PANEL
ALT: 16
ALT: 30
ALT: 25
ALT: 30
AST: 26
AST: 28
AST: 30
Albumin: 1.9 — ABNORMAL LOW
Albumin: 2.2 — ABNORMAL LOW
Alkaline Phosphatase: 101
Alkaline Phosphatase: 115
Alkaline Phosphatase: 129 — ABNORMAL HIGH
Alkaline Phosphatase: 129 — ABNORMAL HIGH
Alkaline Phosphatase: 50
Alkaline Phosphatase: 117
BUN: 14
BUN: 18
BUN: 20
BUN: 27 — ABNORMAL HIGH
BUN: 10
CO2: 28
CO2: 28
CO2: 30
CO2: 31
CO2: 33 — ABNORMAL HIGH
CO2: 34 — ABNORMAL HIGH
Calcium: 9.2
Calcium: 9.3
Calcium: 9.8
Chloride: 105
Chloride: 106
Creatinine, Ser: 0.72
Creatinine, Ser: 1.01
GFR calc Af Amer: >60
GFR calc Af Amer: >60
GFR calc non Af Amer: >60
GFR calc non Af Amer: >60
GFR calc non Af Amer: >60
GFR calc non Af Amer: >60
GFR calc non Af Amer: >60
GFR calc non Af Amer: >60
Glucose, Bld: 111 — ABNORMAL HIGH
Glucose, Bld: 112 — ABNORMAL HIGH
Glucose, Bld: 133 — ABNORMAL HIGH
Glucose, Bld: 153 — ABNORMAL HIGH
Glucose, Bld: 95
Glucose, Bld: 98
Potassium: 3.4 — ABNORMAL LOW
Potassium: 4.4
Potassium: 4.4
Potassium: 4.6
Potassium: 4.9
Potassium: 4.4
Sodium: 134 — ABNORMAL LOW
Sodium: 139
Sodium: 138
Total Bilirubin: 0.6
Total Bilirubin: 0.8
Total Bilirubin: 0.9
Total Protein: 7.1
Total Protein: 7.8
Total Protein: 7.9

## 2011-09-24 LAB — BASIC METABOLIC PANEL
BUN: 10
BUN: 15
BUN: 4 — ABNORMAL LOW
BUN: 8
BUN: 12
CO2: 29
CO2: 29
CO2: 31
CO2: 32
CO2: 35 — ABNORMAL HIGH
Calcium: 7.6 — ABNORMAL LOW
Calcium: 7.7 — ABNORMAL LOW
Calcium: 7.8 — ABNORMAL LOW
Calcium: 9.6
Calcium: 9.8
Calcium: 9.9
Chloride: 107
Chloride: 97
Chloride: 99
Chloride: 99
Creatinine, Ser: 0.67
Creatinine, Ser: 0.79
Creatinine, Ser: 0.9
GFR calc Af Amer: >60
GFR calc Af Amer: >60
GFR calc Af Amer: >60
GFR calc Af Amer: >60
GFR calc Af Amer: >60
GFR calc Af Amer: >60
GFR calc Af Amer: >60
GFR calc non Af Amer: >60
GFR calc non Af Amer: >60
GFR calc non Af Amer: >60
GFR calc non Af Amer: >60
GFR calc non Af Amer: >60
GFR calc non Af Amer: >60
GFR calc non Af Amer: >60
GFR calc non Af Amer: >60
Glucose, Bld: 100 — ABNORMAL HIGH
Glucose, Bld: 112 — ABNORMAL HIGH
Glucose, Bld: 115 — ABNORMAL HIGH
Glucose, Bld: 119 — ABNORMAL HIGH
Glucose, Bld: 130 — ABNORMAL HIGH
Glucose, Bld: 84
Potassium: 3.2 — ABNORMAL LOW
Potassium: 3.5
Potassium: 3.5
Potassium: 3.9
Potassium: 4.2
Potassium: 4.4
Potassium: 4.6
Potassium: 4.1
Sodium: 137
Sodium: 138
Sodium: 138
Sodium: 140
Sodium: 140
Sodium: 141
Sodium: 141
Sodium: 139

## 2011-09-24 LAB — BLOOD GAS, ARTERIAL
Acid-Base Excess: 2.4 — ABNORMAL HIGH
Acid-Base Excess: 4.1 — ABNORMAL HIGH
Bicarbonate: 28.1 — ABNORMAL HIGH
Bicarbonate: 29.7 — ABNORMAL HIGH
Bicarbonate: 29.8 — ABNORMAL HIGH
Bicarbonate: 29.8 — ABNORMAL HIGH
Drawn by: 232811
Drawn by: 235321
Drawn by: 295031
FIO2: 0.4
FIO2: 0.4
FIO2: 0.4
MECHVT: 0.44
MECHVT: 440
MECHVT: 440
O2 Saturation: 92
O2 Saturation: 92.3
O2 Saturation: 97.8
PEEP: 5
PEEP: 5
PEEP: 5
PEEP: 8
PEEP: 8
Patient temperature: 97.8
Patient temperature: 99.4
RATE: 24
RATE: 24
RATE: 24
TCO2: 26.8
TCO2: 27.7
pCO2 arterial: 40.8
pCO2 arterial: 46.1 — ABNORMAL HIGH
pCO2 arterial: 51.5 — ABNORMAL HIGH
pH, Arterial: 7.376
pH, Arterial: 7.388
pH, Arterial: 7.448
pH, Arterial: 7.452 — ABNORMAL HIGH
pH, Arterial: 7.475 — ABNORMAL HIGH
pO2, Arterial: 61.2 — ABNORMAL LOW
pO2, Arterial: 61.3 — ABNORMAL LOW
pO2, Arterial: 62 — ABNORMAL LOW
pO2, Arterial: 67.8 — ABNORMAL LOW
pO2, Arterial: 79.7 — ABNORMAL LOW

## 2011-09-24 LAB — MAGNESIUM
Magnesium: 1.7
Magnesium: 2.2
Magnesium: 2.4
Magnesium: 2.5

## 2011-09-24 LAB — DIFFERENTIAL
Basophils Absolute: 0
Basophils Absolute: 0
Basophils Relative: 0
Basophils Relative: 0
Eosinophils Absolute: 0.2
Eosinophils Relative: 3
Eosinophils Relative: 3
Lymphocytes Relative: 7 — ABNORMAL LOW
Monocytes Absolute: 1.3 — ABNORMAL HIGH
Monocytes Absolute: 1.3 — ABNORMAL HIGH

## 2011-09-24 LAB — HEPARIN LEVEL (UNFRACTIONATED)
Heparin Unfractionated: 0.23 — ABNORMAL LOW
Heparin Unfractionated: 0.33
Heparin Unfractionated: 0.33
Heparin Unfractionated: 0.39
Heparin Unfractionated: 0.42
Heparin Unfractionated: 0.47
Heparin Unfractionated: 0.53
Heparin Unfractionated: 0.71 — ABNORMAL HIGH
Heparin Unfractionated: 0.85 — ABNORMAL HIGH
Heparin Unfractionated: >2 — ABNORMAL HIGH
Heparin Unfractionated: >2 — ABNORMAL HIGH
Heparin Unfractionated: 0.16 — ABNORMAL LOW
Heparin Unfractionated: 0.53
Heparin Unfractionated: 0.58

## 2011-09-24 LAB — TRIGLYCERIDES
Triglycerides: 103
Triglycerides: 106
Triglycerides: 138
Triglycerides: 152 — ABNORMAL HIGH
Triglycerides: 198 — ABNORMAL HIGH
Triglycerides: 156 — ABNORMAL HIGH

## 2011-09-24 LAB — PROTIME-INR
INR: 1.1
INR: 1.4
INR: 2.2 — ABNORMAL HIGH
Prothrombin Time: 17.2 — ABNORMAL HIGH
Prothrombin Time: 20.9 — ABNORMAL HIGH
Prothrombin Time: 25.1 — ABNORMAL HIGH

## 2011-09-24 LAB — VITAMIN B12: Vitamin B-12: 1657 — ABNORMAL HIGH (ref 211–911)

## 2011-09-24 LAB — CULTURE, BLOOD (ROUTINE X 2)

## 2011-09-24 LAB — CHOLESTEROL, TOTAL: Cholesterol: 71

## 2011-09-24 LAB — IRON AND TIBC
Saturation Ratios: 21
TIBC: 171 — ABNORMAL LOW
UIBC: 135

## 2011-09-24 LAB — PHOSPHORUS: Phosphorus: 3.6

## 2011-09-24 LAB — LIPASE, BLOOD: Lipase: 40

## 2011-09-24 LAB — URINE CULTURE

## 2011-09-24 LAB — VALPROIC ACID LEVEL: Valproic Acid Lvl: 20.4 — ABNORMAL LOW

## 2011-09-26 LAB — POCT CARDIAC MARKERS
CKMB, poc: <1 — ABNORMAL LOW
Myoglobin, poc: 39.8
Operator id: 290111

## 2011-09-26 LAB — CBC
MCHC: 33.8
MCV: 85.3
Platelets: 178
RDW: 13.8
WBC: 6

## 2011-09-26 LAB — URINALYSIS, ROUTINE W REFLEX MICROSCOPIC
Bilirubin Urine: NEGATIVE
Hgb urine dipstick: NEGATIVE
Protein, ur: NEGATIVE
Urobilinogen, UA: 0.2

## 2011-09-26 LAB — DIFFERENTIAL
Eosinophils Relative: 1
Lymphocytes Relative: 15
Lymphs Abs: 0.9
Monocytes Absolute: 0.7

## 2011-09-26 LAB — COMPREHENSIVE METABOLIC PANEL
AST: 18
Albumin: 4.5
Calcium: 10
Creatinine, Ser: 0.94
GFR calc Af Amer: >60

## 2011-09-26 LAB — D-DIMER, QUANTITATIVE: D-Dimer, Quant: 0.33

## 2011-10-04 LAB — COMPREHENSIVE METABOLIC PANEL
ALT: 12 U/L (ref 0–53)
AST: 18 U/L (ref 0–37)
Albumin: 3.3 g/dL — ABNORMAL LOW (ref 3.5–5.2)
BUN: 3 mg/dL — ABNORMAL LOW (ref 6–23)
CO2: 28 mEq/L (ref 19–32)
Calcium: 9.4 mg/dL (ref 8.4–10.5)
Chloride: 101 mEq/L (ref 96–112)
Creatinine, Ser: 0.73 mg/dL (ref 0.4–1.5)
Creatinine, Ser: 0.78 mg/dL (ref 0.4–1.5)
GFR calc non Af Amer: >60 mL/min (ref >60)
Glucose, Bld: 112 mg/dL — ABNORMAL HIGH (ref 70–99)
Potassium: 4.2 mEq/L (ref 3.5–5.1)
Total Bilirubin: 0.2 mg/dL — ABNORMAL LOW (ref 0.3–1.2)
Total Protein: 5.2 g/dL — ABNORMAL LOW (ref 6.0–8.3)

## 2011-10-04 LAB — CBC
HCT: 38.8 % — ABNORMAL LOW (ref 39.0–52.0)
HCT: 40.1 % (ref 39.0–52.0)
MCHC: 32.6 g/dL (ref 30.0–36.0)
MCHC: 32.8 g/dL (ref 30.0–36.0)
MCHC: 33.7 g/dL (ref 30.0–36.0)
MCV: 88.1 fL (ref 78.0–100.0)
MCV: 89.7 fL (ref 78.0–100.0)
Platelets: 164 10*3/uL (ref 150–400)
Platelets: 224 10*3/uL (ref 150–400)
RBC: 4.25 MIL/uL (ref 4.22–5.81)
RDW: 13.2 % (ref 11.5–15.5)
RDW: 13.6 % (ref 11.5–15.5)

## 2011-10-04 LAB — LIPASE, BLOOD: Lipase: 14 U/L (ref 11–59)

## 2011-10-04 LAB — URINALYSIS, ROUTINE W REFLEX MICROSCOPIC
Glucose, UA: NEGATIVE mg/dL
Hgb urine dipstick: NEGATIVE
Specific Gravity, Urine: 1.01 (ref 1.005–1.030)
Urobilinogen, UA: 0.2 mg/dL (ref 0.0–1.0)

## 2011-10-04 LAB — AMYLASE: Amylase: 31 U/L (ref 27–131)

## 2011-10-04 LAB — RAPID URINE DRUG SCREEN, HOSP PERFORMED
Amphetamines: NOT DETECTED
Barbiturates: NOT DETECTED
Benzodiazepines: POSITIVE — AB
Opiates: POSITIVE — AB

## 2011-10-04 LAB — DIFFERENTIAL
Basophils Absolute: 0 10*3/uL (ref 0.0–0.1)
Eosinophils Absolute: 0 10*3/uL (ref 0.0–0.7)
Eosinophils Relative: 0 % (ref 0–5)

## 2011-10-04 LAB — BASIC METABOLIC PANEL
CO2: 30 mEq/L (ref 19–32)
Chloride: 107 mEq/L (ref 96–112)
Creatinine, Ser: 0.72 mg/dL (ref 0.4–1.5)
GFR calc Af Amer: >60 mL/min (ref >60)

## 2011-10-04 LAB — ACETAMINOPHEN LEVEL: Acetaminophen (Tylenol), Serum: <10 ug/mL — ABNORMAL LOW (ref 10–30)

## 2011-10-04 LAB — ETHANOL: Alcohol, Ethyl (B): 7 mg/dL (ref 0–10)

## 2011-11-25 ENCOUNTER — Ambulatory Visit (INDEPENDENT_AMBULATORY_CARE_PROVIDER_SITE_OTHER): Payer: Medicare Other | Admitting: Medical

## 2011-11-25 ENCOUNTER — Other Ambulatory Visit: Payer: Self-pay | Admitting: Family Medicine

## 2011-11-25 ENCOUNTER — Encounter: Payer: Self-pay | Admitting: Medical

## 2011-11-25 VITALS — BP 102/80 | HR 68 | Temp 97.8°F | Resp 16 | Wt 165.5 lb

## 2011-11-25 DIAGNOSIS — IMO0002 Reserved for concepts with insufficient information to code with codable children: Secondary | ICD-10-CM

## 2011-11-25 DIAGNOSIS — M5410 Radiculopathy, site unspecified: Secondary | ICD-10-CM | POA: Insufficient documentation

## 2011-11-25 DIAGNOSIS — M549 Dorsalgia, unspecified: Secondary | ICD-10-CM | POA: Insufficient documentation

## 2011-11-25 MED ORDER — TRAMADOL HCL 50 MG PO TABS: 50.0000 mg | ORAL_TABLET | Freq: Four times a day (QID) | ORAL | Status: DC | PRN

## 2011-11-25 MED ORDER — PREDNISONE 10 MG PO TABS: ORAL_TABLET | ORAL | Status: DC

## 2011-11-25 NOTE — Progress Notes (Signed)
Called prednisone and ultram to the [facility]. cls

## 2011-11-25 NOTE — Progress Notes (Signed)
Subjective:   HPI  Joe Lawrence is a 40 y.o. male who presents with back pain.  He reports hx/o similar in the past.  He notes bulging disc and pinched nerve on MRI a few years ago, and ultimately ended up having EDSI after course of physical therapy.  He had not had pain for a long time regarding the low back until this past week.  He normally plays Frisbee gold, and does a lot of twisting motion.  Last week after playing some frisbee golf, started getting low back pain, pain radiating down right leg, and at times sharp intense shooting pains in same area.  Currently pain 8/10.   He notes pain worse with sitting, improved some lying flat.  Standing can aggravate the pain too. He has some right groin/testicle pain as well.  No other recent heavy lifting or other strenuous activity.  He is on disability due to chronic pancreatitis.  Denies GI symptoms or GU symptoms otherwise.   Using Ibuprofen OTC without relief.  No other aggravating or relieving factors.    No other c/o.  The following portions of the patient's history were reviewed and updated as appropriate: allergies, current medications, past family history, past medical history, past social history, past surgical history and problem list  Past Medical History  Diagnosis Date  . Chronic pancreatitis   . Anxiety   . Tobacco abuse    Review of Systems Constitutional: -fever, -chills, -sweats, -unexpected -weight change,-fatigue ENT: -runny nose, -ear pain, -sore throat Cardiology:  -chest pain, -palpitations, -edema Respiratory: -cough, -shortness of breath, -wheezing Gastroenterology: +abdominal pain, -nausea, -vomiting, +diarrhea, +constipation  Hematology: -bleeding or bruising problems Musculoskeletal: -arthralgias, -myalgias, -joint swelling, +back pain Ophthalmology: -vision changes Urology: -dysuria, -difficulty urinating, -hematuria, -urinary frequency, -urgency Neurology: -headache, -weakness, -tingling, -numbness        Objective:   Physical Exam  Filed Vitals:   11/25/11 1418  BP: 102/80  Pulse: 68  Temp: 97.8 F (36.6 C)  Resp: 16    General appearance: alert, no distress, WD/WN, lean white male Neck: supple, no lymphadenopathy, no thyromegaly, no masses Heart: RRR, normal S1, S2, no murmurs Lungs: CTA bilaterally, no wheezes, rhonchi, or rales Abdomen: +bs, soft, non tender, non distended, no masses, no hepatomegaly, no splenomegaly Pulses: 2+ symmetric, upper and lower extremities, normal cap refill Back: tender right lumbar region, pain with ROM, pain with SLR MSK: legs nontender, normal ROM, no obvious deformity Neuro: LE strength and sensation normal, DTRs normal, SLR + on the right to 45 degrees, and left SLR causing contralateral pain in low back, heel and toe walk with pain but no weakness   Assessment and Plan :    Encounter Diagnoses  Name Primary?  . Back pain Yes  . Radiculopathy    I reviewed lumbar xray from last year normal per report, reviewed MRI lumbar spine from 2009 - Findings: Normal lumbar alignment. There is mild motion on the study. There is no fracture and the conus medullaris appears normal. Small hemangioma in the L2 vertebral body on the left is unchanged. L1-2: Very mild disc degeneration L2-3: Very mild disc degeneration. L3-4: Mild disc degeneration and disc desiccation. There is no disc protrusion or spinal stenosis. L4-5: Negative L5-S1: Small right-sided disc protrusion is unchanged the prior MRI. This is touching but not compressing the right S1 nerve root. There is mild facet arthropathy particularly on the left which is unchanged the prior study. IMPRESSION: Overall no significant change from prior MRI. There is  mild disc  degeneration. There is a small right-sided disc protrusion at L5- S1.    We will treat for back pain and radiculopathy with round of prednisone taper and Ultram prn for pain, rest, gentle ROM.  Discussed worrisome signs that would prompt  urgent recheck such as saddle anesthesia, GU or GI problems, worse pain, worse paresthesias.  Recheck 1-2 wk.   Follow-up  1-2 wk.

## 2011-11-25 NOTE — Progress Notes (Signed)
Called out tramadol to the [facility] e-prescribing was down. cls

## 2011-11-25 NOTE — Telephone Encounter (Signed)
Is this okay to refill? 

## 2011-11-26 ENCOUNTER — Telehealth: Payer: Self-pay | Admitting: Medical

## 2011-11-28 ENCOUNTER — Other Ambulatory Visit: Payer: Self-pay | Admitting: Medical

## 2011-11-28 MED ORDER — CITALOPRAM HYDROBROMIDE 40 MG PO TABS: 40.0000 mg | ORAL_TABLET | Freq: Every day | ORAL | Status: DC

## 2011-11-28 MED ORDER — CITALOPRAM HYDROBROMIDE 20 MG PO TABS: 20.0000 mg | ORAL_TABLET | Freq: Every day | ORAL | Status: DC

## 2011-11-28 NOTE — Telephone Encounter (Signed)
Joe Lawrence I SPOKE WITH THE PATIENT AND HE STATES THAT HE TAKES 60MG  A DAY OF CELEXA. CLS

## 2011-11-28 NOTE — Telephone Encounter (Signed)
I received refill request.  Chart shows 2 different doses of Citalopram.  Is he on 20mg  or 40mg  daily?

## 2011-12-02 ENCOUNTER — Encounter: Payer: Self-pay | Admitting: Internal Medicine

## 2011-12-03 ENCOUNTER — Ambulatory Visit: Payer: Medicare Other | Admitting: Family Medicine

## 2012-01-06 ENCOUNTER — Encounter: Payer: Self-pay | Admitting: Family Medicine

## 2012-01-06 ENCOUNTER — Ambulatory Visit (INDEPENDENT_AMBULATORY_CARE_PROVIDER_SITE_OTHER): Payer: Medicare Other | Admitting: Family Medicine

## 2012-01-06 DIAGNOSIS — Z8719 Personal history of other diseases of the digestive system: Secondary | ICD-10-CM

## 2012-01-06 DIAGNOSIS — J209 Acute bronchitis, unspecified: Secondary | ICD-10-CM

## 2012-01-06 DIAGNOSIS — IMO0001 Reserved for inherently not codable concepts without codable children: Secondary | ICD-10-CM

## 2012-01-06 DIAGNOSIS — Z79899 Other long term (current) drug therapy: Secondary | ICD-10-CM

## 2012-01-06 MED ORDER — LEVOFLOXACIN 500 MG PO TABS: 500.0000 mg | ORAL_TABLET | Freq: Every day | ORAL | Status: AC

## 2012-01-06 NOTE — Progress Notes (Signed)
  Subjective:    Patient ID: Joe Lawrence, male    DOB: 07/18/1971, 41 y.o.   MRN: [id number]  HPI He has a one-month history of sinus and nasal congestion, or rhinorrhea, PND with productive cough. No sore throat, earache. He does smoke. He also complains of ringing in his ear for the last year. Also complains of left flank pain worse after eating with excessive gas production. He also complains of diffuse myalgias He was using Nexium however it caused a different kind of gas and he stopped that. He has weaned himself off of all pain medications however he is having continued left flank pain that he blames on his chronic pancreatitis. He has increased his Creon to 2 tablets prior to the meals but still having a lot of pain and gas.   Review of Systems     Objective:   Physical Exam alert and in no distress. Tympanic membranes and canals are normal. Throat is clear. Tonsils are normal. Neck is supple without adenopathy or thyromegaly. Cardiac exam shows a regular sinus rhythm without murmurs or gallops. Lungs are clear to auscultation. Abdominal exam shows decreased bowel sounds with minimal tenderness to palpation in the left upper quadrant close lower cord.       Assessment & Plan:   1. History of pancreatitis    2. Bronchitis, acute    3. Encounter for long-term (current) use of other medications  CBC with Differential, CK (Creatine Kinase), Comprehensive metabolic panel, Lipid panel  4. Myalgia and myositis  CBC with Differential, CK (Creatine Kinase), Comprehensive metabolic panel   I will place him on Levaquin to help with his bronchitis symptoms. He is to get me the name of the GI doctors he saw from [facility] for possible referral.

## 2012-01-06 NOTE — Patient Instructions (Signed)
Take all the antibiotic. Take 3 of those pills before meals (Creon). Call back with the name of the GI doctor in [place]

## 2012-01-07 ENCOUNTER — Telehealth: Payer: Self-pay | Admitting: Family Medicine

## 2012-01-07 LAB — LIPID PANEL
HDL: 45 mg/dL (ref >39)
LDL Cholesterol: 119 mg/dL — ABNORMAL HIGH (ref 0–99)

## 2012-01-07 LAB — CBC WITH DIFFERENTIAL/PLATELET
Eosinophils Relative: 2 % (ref 0–5)
HCT: 44.5 % (ref 39.0–52.0)
Lymphocytes Relative: 11 % — ABNORMAL LOW (ref 12–46)
Lymphs Abs: 1.1 10*3/uL (ref 0.7–4.0)
MCV: 89 fL (ref 78.0–100.0)
Monocytes Absolute: 1.1 10*3/uL — ABNORMAL HIGH (ref 0.1–1.0)
Neutro Abs: 7.5 10*3/uL (ref 1.7–7.7)
RBC: 5 MIL/uL (ref 4.22–5.81)
WBC: 9.8 10*3/uL (ref 4.0–10.5)

## 2012-01-07 LAB — COMPREHENSIVE METABOLIC PANEL
AST: 15 U/L (ref 0–37)
Albumin: 4.5 g/dL (ref 3.5–5.2)
BUN: 14 mg/dL (ref 6–23)
Calcium: 10.2 mg/dL (ref 8.4–10.5)
Chloride: 103 mEq/L (ref 96–112)
Glucose, Bld: 93 mg/dL (ref 70–99)
Potassium: 4.3 mEq/L (ref 3.5–5.3)
Sodium: 138 mEq/L (ref 135–145)
Total Protein: 6.7 g/dL (ref 6.0–8.3)

## 2012-01-13 ENCOUNTER — Telehealth: Payer: Self-pay | Admitting: Family Medicine

## 2012-01-14 ENCOUNTER — Telehealth: Payer: Self-pay | Admitting: Family Medicine

## 2012-01-14 MED ORDER — AZITHROMYCIN 500 MG PO TABS: 500.0000 mg | ORAL_TABLET | Freq: Every day | ORAL | Status: AC

## 2012-01-14 NOTE — Telephone Encounter (Signed)
Azithromycin called in for treatment of bronchitis

## 2012-01-14 NOTE — Telephone Encounter (Signed)
tsd  °

## 2012-01-16 NOTE — Telephone Encounter (Signed)
DONE

## 2012-01-27 ENCOUNTER — Telehealth: Payer: Self-pay | Admitting: Family Medicine

## 2012-01-28 ENCOUNTER — Encounter: Payer: Self-pay | Admitting: Family Medicine

## 2012-01-28 ENCOUNTER — Ambulatory Visit (INDEPENDENT_AMBULATORY_CARE_PROVIDER_SITE_OTHER): Payer: Medicare Other | Admitting: Family Medicine

## 2012-01-28 VITALS — BP 124/86 | HR 87 | Ht 71.0 in | Wt 167.0 lb

## 2012-01-28 DIAGNOSIS — M898X1 Other specified disorders of bone, shoulder: Secondary | ICD-10-CM

## 2012-01-28 DIAGNOSIS — M25519 Pain in unspecified shoulder: Secondary | ICD-10-CM

## 2012-01-28 NOTE — Progress Notes (Signed)
  Subjective:    Patient ID: Joe Lawrence, male    DOB: 25-Jul-1971, 41 y.o.   MRN: [id number]  HPI He complains of pain in the right [place] joint area. This started after he stretched his right arm across his chest and felt a popping sensation. Since then he has had some pain in that area but no shortness of breath. He has not yet heard from the pain management clinic.   Review of Systems     Objective:   Physical Exam Alert and in no distress. Slight tenderness to palpation and swelling is noted in the right [place] joint. The rest of the clavicle and a.c. joint is normal. Lungs clear to auscultation.       Assessment & Plan:   1. Clavicle pain    explained to him that he did strain at the [facility]. joint. Recommend conservative care. He will call if further difficulties. I did call the pain management clinic and advocated for him stating that he did not want to be placed back on pain meds but would rather have a celiac nerve block.

## 2012-01-28 NOTE — Patient Instructions (Signed)
Use heat for 20 minutes 3 times per day and use Advil or Aleve. If something hurts don't do it!

## 2012-01-30 NOTE — Telephone Encounter (Signed)
PER [facility],  SEROQUEL 150MG  IS COVERED, AND DOESN'T REQUIRE P.A.  BUT THE 50MG  DOES.Marland KitchenMarland Kitchen

## 2012-02-13 ENCOUNTER — Telehealth: Payer: Self-pay | Admitting: Family Medicine

## 2012-02-13 MED ORDER — TRAMADOL HCL 50 MG PO TABS: 50.0000 mg | ORAL_TABLET | Freq: Three times a day (TID) | ORAL | Status: DC | PRN

## 2012-02-13 NOTE — Telephone Encounter (Signed)
He called stating he is still having difficulty with the [place] joint discomfort. I will give him some tramadol which he says he doesn't have any difficulty with. Will also call tomorrow to have an x-ray set up. We also will need to check on his pain management since they have not called him.

## 2012-02-14 ENCOUNTER — Telehealth: Payer: Self-pay

## 2012-02-14 ENCOUNTER — Other Ambulatory Visit: Payer: Self-pay

## 2012-02-14 ENCOUNTER — Ambulatory Visit
Admission: RE | Admit: 2012-02-14 | Discharge: 2012-02-14 | Disposition: A | Discharge status: Home / Self Care | Payer: Medicare Other | Source: Ambulatory Visit | Attending: Family Medicine | Admitting: Family Medicine

## 2012-02-14 DIAGNOSIS — R52 Pain, unspecified: Secondary | ICD-10-CM

## 2012-02-14 NOTE — Telephone Encounter (Signed)
AS TO L.M MESSAGE ON 01/14/12 SHE HAD TALKED WITH JULIE I CALLED BACK TODAY AND JULIE SAID THAT SHE WOULD CALL PT TODAY TO SET UP APPT THAT THE DR OK PT TO COME IN

## 2012-02-24 ENCOUNTER — Telehealth: Payer: Self-pay | Admitting: Internal Medicine

## 2012-02-24 NOTE — Telephone Encounter (Signed)
Left message word for word  

## 2012-02-24 NOTE — Telephone Encounter (Signed)
Have him make an appointment so we can get more data in order to order the MRI

## 2012-02-25 ENCOUNTER — Ambulatory Visit (INDEPENDENT_AMBULATORY_CARE_PROVIDER_SITE_OTHER): Payer: Medicare Other | Admitting: Family Medicine

## 2012-02-25 ENCOUNTER — Encounter: Payer: Self-pay | Admitting: Family Medicine

## 2012-02-25 VITALS — BP 117/80 | HR 80

## 2012-02-25 DIAGNOSIS — M25519 Pain in unspecified shoulder: Secondary | ICD-10-CM

## 2012-02-25 DIAGNOSIS — M898X1 Other specified disorders of bone, shoulder: Secondary | ICD-10-CM

## 2012-02-25 MED ORDER — DICLOFENAC SODIUM 1 % TD GEL: 1.0000 "application " | Freq: Four times a day (QID) | TRANSDERMAL | Status: DC

## 2012-02-25 NOTE — Patient Instructions (Signed)
Use the Voltaren for several days and if no improvement, call me

## 2012-02-25 NOTE — Progress Notes (Signed)
  Subjective:    Patient ID: Imanuel Pruiett, male    DOB: Aug 20, 1971, 41 y.o.   MRN: [id number]  HPI He continues to have a right [place] joint discomfort. He again describes doing stretching with his arms crossed across his chest and causing some right [place] joint discomfort. He describes it as a 7/10. It has not responded to conservative care. He has noted some cracking sensation in that area.   Review of Systems     Objective:   Physical Exam Exam of the anterior chest shows no visible lesions. He is tender to palpation over the right [place] joint. Slight clicking noted upon compression of the [place] joint.       Assessment & Plan:  [place] joint damage. Case was discussed with Dr. Lajoyce Corners who recommended conservative care. I will initially try him on Voltaren and if this doesn't work, Xylocaine patches.

## 2012-02-27 ENCOUNTER — Other Ambulatory Visit: Payer: Self-pay | Admitting: Family Medicine

## 2012-03-12 ENCOUNTER — Other Ambulatory Visit: Payer: Self-pay | Admitting: Medical

## 2012-03-12 ENCOUNTER — Telehealth: Payer: Self-pay | Admitting: Internal Medicine

## 2012-03-12 MED ORDER — TRAMADOL HCL 50 MG PO TABS: 50.0000 mg | ORAL_TABLET | Freq: Three times a day (TID) | ORAL | Status: AC | PRN

## 2012-03-13 NOTE — Telephone Encounter (Signed)
done

## 2012-03-20 ENCOUNTER — Other Ambulatory Visit: Payer: Self-pay | Admitting: Family Medicine

## 2012-03-23 ENCOUNTER — Telehealth: Payer: Self-pay | Admitting: Internal Medicine

## 2012-03-23 NOTE — Telephone Encounter (Signed)
Is this ok?

## 2012-03-23 NOTE — Telephone Encounter (Signed)
Refer him to orthopedics and see if he has a orthopedic surgeon in mind .

## 2012-03-23 NOTE — Telephone Encounter (Signed)
Rx renewed

## 2012-03-23 NOTE — Telephone Encounter (Signed)
No answer so faxed info to [facility]

## 2012-04-13 ENCOUNTER — Other Ambulatory Visit: Payer: Self-pay | Admitting: Orthopedic Surgery

## 2012-04-13 DIAGNOSIS — M25511 Pain in right shoulder: Secondary | ICD-10-CM

## 2012-04-15 ENCOUNTER — Inpatient Hospital Stay: Admission: RE | Admit: 2012-04-15 | Payer: Medicare Other | Source: Ambulatory Visit

## 2012-04-15 ENCOUNTER — Ambulatory Visit
Admission: RE | Admit: 2012-04-15 | Discharge: 2012-04-15 | Disposition: A | Discharge status: Home / Self Care | Payer: Medicare Other | Source: Ambulatory Visit | Attending: Orthopedic Surgery | Admitting: Orthopedic Surgery

## 2012-04-15 DIAGNOSIS — M25511 Pain in right shoulder: Secondary | ICD-10-CM

## 2012-04-21 ENCOUNTER — Other Ambulatory Visit: Payer: Self-pay | Admitting: Family Medicine

## 2012-04-22 ENCOUNTER — Encounter: Payer: Self-pay | Admitting: Family Medicine

## 2012-07-20 ENCOUNTER — Ambulatory Visit (INDEPENDENT_AMBULATORY_CARE_PROVIDER_SITE_OTHER): Payer: Medicare Other | Admitting: Family Medicine

## 2012-07-20 ENCOUNTER — Encounter: Payer: Self-pay | Admitting: Family Medicine

## 2012-07-20 VITALS — BP 110/60 | HR 82 | Wt 165.0 lb

## 2012-07-20 DIAGNOSIS — F429 Obsessive-compulsive disorder, unspecified: Secondary | ICD-10-CM

## 2012-07-20 DIAGNOSIS — Z8719 Personal history of other diseases of the digestive system: Secondary | ICD-10-CM | POA: Insufficient documentation

## 2012-07-20 MED ORDER — SEROQUEL 50 MG PO TABS: 50.0000 mg | ORAL_TABLET | Freq: Every day | ORAL | Status: DC

## 2012-07-20 MED ORDER — PANTOPRAZOLE SODIUM 40 MG PO TBEC: 40.0000 mg | DELAYED_RELEASE_TABLET | Freq: Every day | ORAL | Status: DC

## 2012-07-20 NOTE — Progress Notes (Signed)
  Subjective:    Patient ID: Joe Lawrence, male    DOB: 1971/10/22, 41 y.o.   MRN: [id number]  HPI He is here for consult concerning his excessive compulsive disorder. He has been stable using Seroquel however recently he was switched to generic and notes an increase in his anxiety as well as OCD items. Like to be placed back on the branded products. He also notes that Protonix seems to help the most with his abdominal symptoms. He has tried Pepcid, Tagamet, Zegerid and Prilosec none of which have been as successful as Protonix. The abdominal pain is related to a long history of pancreatitis. Presently he is also taking Creon. He still is having difficulty with diarrhea. He is involved in pain management and is scheduled on the 24th for her present a celiac plexus procedure.   Review of Systems     Objective:   Physical Exam Alert and in no distress otherwise not examined      Assessment & Plan:   1. OBSESSIVE-COMPULSIVE DISORDER  SEROQUEL 50 MG tablet  2. History of pancreatitis  pantoprazole (PROTONIX) 40 MG tablet, Amylase, Lipase

## 2012-07-21 ENCOUNTER — Telehealth: Payer: Self-pay | Admitting: Family Medicine

## 2012-07-21 LAB — AMYLASE: Amylase: 31 U/L (ref 0–105)

## 2012-07-21 NOTE — Telephone Encounter (Signed)
LM

## 2012-07-27 ENCOUNTER — Other Ambulatory Visit: Payer: Self-pay

## 2012-07-27 ENCOUNTER — Other Ambulatory Visit: Payer: Self-pay | Admitting: Family Medicine

## 2012-07-27 MED ORDER — PANCRELIPASE (LIP-PROT-AMYL) 24000-76000 UNITS PO CPEP: ORAL_CAPSULE | ORAL | Status: DC

## 2012-07-27 NOTE — Telephone Encounter (Signed)
Is this ok?

## 2012-07-27 NOTE — Telephone Encounter (Signed)
Med sent in.

## 2012-08-18 ENCOUNTER — Other Ambulatory Visit: Payer: Self-pay

## 2012-08-18 DIAGNOSIS — Z8719 Personal history of other diseases of the digestive system: Secondary | ICD-10-CM

## 2012-08-18 MED ORDER — PANTOPRAZOLE SODIUM 40 MG PO TBEC: 40.0000 mg | DELAYED_RELEASE_TABLET | Freq: Every day | ORAL | Status: DC

## 2012-08-18 NOTE — Telephone Encounter (Signed)
PT  CALLED AND STATED THAT HE HAS TO HAVE NAME BRAND ONLY PROTONIX BECAUSE THE GENERIC MAKES HIM SICK SENT MED IN

## 2012-09-26 ENCOUNTER — Inpatient Hospital Stay (HOSPITAL_COMMUNITY)
Admission: EM | Admit: 2012-09-26 | Discharge: 2012-09-30 | DRG: 917 | Disposition: A | Discharge status: Home / Self Care | Payer: Medicare Other | Attending: Pulmonary Disease | Admitting: Pulmonary Disease

## 2012-09-26 ENCOUNTER — Inpatient Hospital Stay (HOSPITAL_COMMUNITY): Payer: Medicare Other

## 2012-09-26 ENCOUNTER — Emergency Department (HOSPITAL_COMMUNITY): Payer: Medicare Other

## 2012-09-26 DIAGNOSIS — Z79899 Other long term (current) drug therapy: Secondary | ICD-10-CM

## 2012-09-26 DIAGNOSIS — Y9229 Other specified public building as the place of occurrence of the external cause: Secondary | ICD-10-CM

## 2012-09-26 DIAGNOSIS — F1110 Opioid abuse, uncomplicated: Secondary | ICD-10-CM | POA: Diagnosis present

## 2012-09-26 DIAGNOSIS — K861 Other chronic pancreatitis: Secondary | ICD-10-CM | POA: Diagnosis present

## 2012-09-26 DIAGNOSIS — G8929 Other chronic pain: Secondary | ICD-10-CM | POA: Diagnosis present

## 2012-09-26 DIAGNOSIS — Z888 Allergy status to other drugs, medicaments and biological substances status: Secondary | ICD-10-CM

## 2012-09-26 DIAGNOSIS — R4182 Altered mental status, unspecified: Secondary | ICD-10-CM | POA: Diagnosis present

## 2012-09-26 DIAGNOSIS — F17200 Nicotine dependence, unspecified, uncomplicated: Secondary | ICD-10-CM | POA: Diagnosis present

## 2012-09-26 DIAGNOSIS — E876 Hypokalemia: Secondary | ICD-10-CM | POA: Diagnosis present

## 2012-09-26 DIAGNOSIS — R0902 Hypoxemia: Secondary | ICD-10-CM

## 2012-09-26 DIAGNOSIS — F1920 Other psychoactive substance dependence, uncomplicated: Secondary | ICD-10-CM | POA: Diagnosis present

## 2012-09-26 DIAGNOSIS — M549 Dorsalgia, unspecified: Secondary | ICD-10-CM

## 2012-09-26 DIAGNOSIS — F411 Generalized anxiety disorder: Secondary | ICD-10-CM | POA: Diagnosis present

## 2012-09-26 DIAGNOSIS — Z23 Encounter for immunization: Secondary | ICD-10-CM

## 2012-09-26 DIAGNOSIS — R6250 Unspecified lack of expected normal physiological development in childhood: Secondary | ICD-10-CM | POA: Diagnosis present

## 2012-09-26 DIAGNOSIS — F909 Attention-deficit hyperactivity disorder, unspecified type: Secondary | ICD-10-CM | POA: Diagnosis present

## 2012-09-26 DIAGNOSIS — F319 Bipolar disorder, unspecified: Secondary | ICD-10-CM | POA: Diagnosis present

## 2012-09-26 DIAGNOSIS — Z882 Allergy status to sulfonamides status: Secondary | ICD-10-CM

## 2012-09-26 DIAGNOSIS — F1120 Opioid dependence, uncomplicated: Secondary | ICD-10-CM

## 2012-09-26 DIAGNOSIS — Z8719 Personal history of other diseases of the digestive system: Secondary | ICD-10-CM

## 2012-09-26 DIAGNOSIS — F1021 Alcohol dependence, in remission: Secondary | ICD-10-CM | POA: Diagnosis present

## 2012-09-26 DIAGNOSIS — J9601 Acute respiratory failure with hypoxia: Secondary | ICD-10-CM

## 2012-09-26 DIAGNOSIS — F1210 Cannabis abuse, uncomplicated: Secondary | ICD-10-CM

## 2012-09-26 DIAGNOSIS — T402X1A Poisoning by other opioids, accidental (unintentional), initial encounter: Secondary | ICD-10-CM

## 2012-09-26 DIAGNOSIS — T40601A Poisoning by unspecified narcotics, accidental (unintentional), initial encounter: Principal | ICD-10-CM | POA: Diagnosis present

## 2012-09-26 DIAGNOSIS — M5410 Radiculopathy, site unspecified: Secondary | ICD-10-CM

## 2012-09-26 DIAGNOSIS — F429 Obsessive-compulsive disorder, unspecified: Secondary | ICD-10-CM

## 2012-09-26 DIAGNOSIS — J9600 Acute respiratory failure, unspecified whether with hypoxia or hypercapnia: Secondary | ICD-10-CM | POA: Diagnosis present

## 2012-09-26 HISTORY — DX: Bipolar disorder, unspecified: F31.9

## 2012-09-26 HISTORY — DX: Other chronic pain: G89.29

## 2012-09-26 HISTORY — DX: Acute pancreatitis without necrosis or infection, unspecified: K85.90

## 2012-09-26 HISTORY — DX: Bronchitis, not specified as acute or chronic: J40

## 2012-09-26 HISTORY — DX: Personal history of self-harm: Z91.5

## 2012-09-26 HISTORY — DX: Personal history of suicidal behavior: Z91.51

## 2012-09-26 HISTORY — DX: Dorsalgia, unspecified: M54.9

## 2012-09-26 LAB — CBC WITH DIFFERENTIAL/PLATELET
Basophils Relative: 0 % (ref 0–1)
Eosinophils Absolute: 0 10*3/uL (ref 0.0–0.7)
Hemoglobin: 14 g/dL (ref 13.0–17.0)
Lymphs Abs: 0.7 10*3/uL (ref 0.7–4.0)
MCHC: 34.5 g/dL (ref 30.0–36.0)
Monocytes Relative: 3 % (ref 3–12)
Neutro Abs: 6.1 10*3/uL (ref 1.7–7.7)
Neutrophils Relative %: 88 % — ABNORMAL HIGH (ref 43–77)
Platelets: 281 10*3/uL (ref 150–400)
RBC: 4.82 MIL/uL (ref 4.22–5.81)

## 2012-09-26 LAB — URINALYSIS, ROUTINE W REFLEX MICROSCOPIC
Bilirubin Urine: NEGATIVE
Ketones, ur: NEGATIVE mg/dL
Leukocytes, UA: NEGATIVE
Nitrite: NEGATIVE
Protein, ur: NEGATIVE mg/dL
Urobilinogen, UA: 0.2 mg/dL (ref 0.0–1.0)

## 2012-09-26 LAB — BLOOD GAS, ARTERIAL
Acid-base deficit: 2.4 mmol/L — ABNORMAL HIGH (ref 0.0–2.0)
Bicarbonate: 21.7 mEq/L (ref 20.0–24.0)
FIO2: 0.5 %
O2 Saturation: 96.3 %
PEEP: 5 cmH2O
Patient temperature: 96.4
RATE: 18 resp/min
pO2, Arterial: 80.1 mmHg (ref 80.0–100.0)

## 2012-09-26 LAB — RAPID URINE DRUG SCREEN, HOSP PERFORMED
Amphetamines: NOT DETECTED
Barbiturates: NOT DETECTED
Opiates: POSITIVE — AB
Tetrahydrocannabinol: NOT DETECTED

## 2012-09-26 LAB — COMPREHENSIVE METABOLIC PANEL
ALT: 13 U/L (ref 0–53)
Albumin: 4 g/dL (ref 3.5–5.2)
Alkaline Phosphatase: 66 U/L (ref 39–117)
BUN: 8 mg/dL (ref 6–23)
Chloride: 97 mEq/L (ref 96–112)
Potassium: 2.8 mEq/L — ABNORMAL LOW (ref 3.5–5.1)
Sodium: 133 mEq/L — ABNORMAL LOW (ref 135–145)
Total Bilirubin: 0.7 mg/dL (ref 0.3–1.2)
Total Protein: 6.9 g/dL (ref 6.0–8.3)

## 2012-09-26 LAB — ACETAMINOPHEN LEVEL: Acetaminophen (Tylenol), Serum: <15 ug/mL (ref 10–30)

## 2012-09-26 LAB — SALICYLATE LEVEL
Salicylate Lvl: <2 mg/dL — ABNORMAL LOW (ref 2.8–20.0)
Salicylate Lvl: <2 mg/dL — ABNORMAL LOW (ref 2.8–20.0)

## 2012-09-26 LAB — ETHANOL: Alcohol, Ethyl (B): 32 mg/dL — ABNORMAL HIGH (ref 0–11)

## 2012-09-26 MED ORDER — ROCURONIUM BROMIDE 50 MG/5ML IV SOLN
INTRAVENOUS | Status: AC
  Administered 2012-09-26: 20:00:00
  Filled 2012-09-26: qty 2

## 2012-09-26 MED ORDER — HEPARIN SODIUM (PORCINE) 5000 UNIT/ML IJ SOLN
5000.0000 [IU] | Freq: Three times a day (TID) | INTRAMUSCULAR | Status: DC
  Administered 2012-09-27 – 2012-09-29 (×8): 5000 [IU] via SUBCUTANEOUS
  Filled 2012-09-26 (×15): qty 1

## 2012-09-26 MED ORDER — POTASSIUM CHLORIDE 10 MEQ/100ML IV SOLN
10.0000 meq | Freq: Once | INTRAVENOUS | Status: AC
  Administered 2012-09-26: 10 meq via INTRAVENOUS
  Filled 2012-09-26: qty 100

## 2012-09-26 MED ORDER — PANTOPRAZOLE SODIUM 40 MG IV SOLR
40.0000 mg | Freq: Every day | INTRAVENOUS | Status: DC
  Administered 2012-09-27 (×2): 40 mg via INTRAVENOUS
  Filled 2012-09-26 (×2): qty 40

## 2012-09-26 MED ORDER — NALOXONE HCL 1 MG/ML IJ SOLN
INTRAMUSCULAR | Status: AC
  Administered 2012-09-26: 17:00:00
  Filled 2012-09-26: qty 2

## 2012-09-26 MED ORDER — NALOXONE HCL 1 MG/ML IJ SOLN
INTRAMUSCULAR | Status: AC
  Filled 2012-09-26: qty 2

## 2012-09-26 MED ORDER — ETOMIDATE 2 MG/ML IV SOLN
INTRAVENOUS | Status: AC
  Administered 2012-09-26: 2 mg
  Filled 2012-09-26: qty 20

## 2012-09-26 MED ORDER — PROPOFOL 10 MG/ML IV EMUL
INTRAVENOUS | Status: AC
  Filled 2012-09-26: qty 100

## 2012-09-26 MED ORDER — FLUMAZENIL 0.5 MG/5ML IV SOLN
INTRAVENOUS | Status: AC
  Administered 2012-09-26: 17:00:00
  Filled 2012-09-26: qty 5

## 2012-09-26 MED ORDER — NALOXONE HCL 1 MG/ML IJ SOLN
INTRAMUSCULAR | Status: AC
  Administered 2012-09-26: 19:00:00
  Filled 2012-09-26: qty 2

## 2012-09-26 MED ORDER — SUCCINYLCHOLINE CHLORIDE 20 MG/ML IJ SOLN
INTRAMUSCULAR | Status: AC
  Administered 2012-09-26: 20:00:00
  Filled 2012-09-26: qty 5

## 2012-09-26 MED ORDER — BIOTENE DRY MOUTH MT LIQD: 15.0000 mL | Freq: Two times a day (BID) | OROMUCOSAL | Status: DC

## 2012-09-26 MED ORDER — SODIUM CHLORIDE 0.9 % IV SOLN
INTRAVENOUS | Status: DC
  Administered 2012-09-26 – 2012-09-27 (×2): via INTRAVENOUS

## 2012-09-26 MED ORDER — SODIUM CHLORIDE 0.9 % IV SOLN: 250.0000 mL | INTRAVENOUS | Status: DC | PRN

## 2012-09-26 MED ORDER — POTASSIUM CHLORIDE 10 MEQ/100ML IV SOLN
10.0000 meq | INTRAVENOUS | Status: AC
  Administered 2012-09-26 – 2012-09-27 (×2): 10 meq via INTRAVENOUS
  Filled 2012-09-26 (×2): qty 100

## 2012-09-26 MED ORDER — LIDOCAINE HCL (CARDIAC) 20 MG/ML IV SOLN
INTRAVENOUS | Status: AC
  Filled 2012-09-26: qty 5

## 2012-09-26 MED ORDER — CHLORHEXIDINE GLUCONATE 0.12 % MT SOLN
15.0000 mL | Freq: Two times a day (BID) | OROMUCOSAL | Status: DC
  Administered 2012-09-26 – 2012-09-28 (×4): 15 mL via OROMUCOSAL
  Filled 2012-09-26 (×8): qty 15

## 2012-09-26 MED ORDER — ALBUTEROL SULFATE HFA 108 (90 BASE) MCG/ACT IN AERS: 4.0000 | INHALATION_SPRAY | RESPIRATORY_TRACT | Status: DC | PRN

## 2012-09-26 MED ORDER — SODIUM CHLORIDE 0.9 % IV SOLN
1000.0000 mL | INTRAVENOUS | Status: DC
  Administered 2012-09-26 (×2): 1000 mL via INTRAVENOUS

## 2012-09-26 MED ORDER — PROPOFOL 10 MG/ML IV EMUL
5.0000 ug/kg/min | INTRAVENOUS | Status: DC
  Administered 2012-09-26: 30 ug/kg/min via INTRAVENOUS
  Administered 2012-09-26: 20:00:00 via INTRAVENOUS
  Administered 2012-09-27: 30 ug/kg/min via INTRAVENOUS
  Filled 2012-09-26 (×2): qty 100

## 2012-09-26 MED ORDER — CHLORHEXIDINE GLUCONATE 0.12 % MT SOLN
OROMUCOSAL | Status: AC
  Administered 2012-09-26: 15 mL via OROMUCOSAL
  Filled 2012-09-26: qty 15
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
## 2012-09-26 NOTE — ED Notes (Signed)
MD at bedside. 

## 2012-09-26 NOTE — Progress Notes (Signed)
Hypokalemia   K replaced  

## 2012-09-26 NOTE — ED Notes (Signed)
Restrains removed.

## 2012-09-26 NOTE — ED Notes (Signed)
Per Dr. Radford Pax give narcan 2mg  of narcan iv and done.

## 2012-09-26 NOTE — ED Notes (Signed)
21 at lip ET tube

## 2012-09-26 NOTE — ED Notes (Signed)
Joe Lawrence pt had no gag reflex,  ,  Oxygen saturation  68 %  Dr Radford Pax immediately at bedisde and Dr Caryl Pina ICU admitting MD at bedside,  Pt moved to Res A  Suction setup ,  yauker set up  Oxygen   Setup ,  Pt restrained per Dr Radford Pax,  Two rounds of narcan  And ramazicon given throught IV  Right forearm.  Pt on cardiac monitor,  ,  diprivan at bedside,  , airway cart at bedside,  ET tube being obtained from pyxis, respiratory at bedside,  Nurses present are Gillis Ends RN,  Alessandra Bevels  RN,  Darlin Drop,  [place] NT , Amie RN,  [place] NT

## 2012-09-26 NOTE — ED Notes (Signed)
Tube size,7.5  In,  Color change now 2007
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
## 2012-09-26 NOTE — ED Notes (Signed)
Pt Unable to void at this time.

## 2012-09-26 NOTE — ED Notes (Signed)
Per Dr. Radford Pax v/o give romazicon 2ml iv and done.

## 2012-09-26 NOTE — ED Notes (Addendum)
Overall general across the room assessment patient has been fidgety, keeping eyes closed and still being monitor for the possibility of aspiration if patient was to vomit. This after 10-57min.s after romazicon & narcan given. Suction bedside. Dr. Radford Pax made aware inquired of repeating narcan, however after Dr. Radford Pax re-assessed patient made the decision not to push narcan yet.
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
## 2012-09-26 NOTE — Procedures (Signed)
Intubation Procedure Note Dairon Procter [id number] 01-Oct-1971  Procedure: Intubation Indications: Airway protection and maintenance  Procedure Details Consent: Unable to obtain consent because of altered level of consciousness. Time Out: Verified patient identification, verified procedure, site/side was marked, verified correct patient position, special equipment/implants available, medications/allergies/relevent history reviewed, required imaging and test results available.  Performed  Medications used: etomidate and succinylcholine Equipment used: Glydescope (size 4) Grade I View Correct placement confirmed by by auscultation, by CXR and ETCO2 monitor Tube secured at 21 cm at the lip  Evaluation Hemodynamic Status: BP stable throughout; O2 sats: stable throughout Patient's Current Condition: stable Complications: No apparent complications Patient did tolerate procedure well. Chest X-ray ordered to verify placement.  CXR: tube position acceptable.   Overton Mam, M.D. Pulmonary and Critical Care Medicine Call E-link with questions [phone] 09/26/2012

## 2012-09-26 NOTE — ED Notes (Signed)
Etomidate 20  Given,  Succicholine  120  Given now  2006

## 2012-09-27 DIAGNOSIS — J9601 Acute respiratory failure with hypoxia: Secondary | ICD-10-CM

## 2012-09-27 DIAGNOSIS — T426X4A Poisoning by other antiepileptic and sedative-hypnotic drugs, undetermined, initial encounter: Secondary | ICD-10-CM

## 2012-09-27 DIAGNOSIS — T4271XA Poisoning by unspecified antiepileptic and sedative-hypnotic drugs, accidental (unintentional), initial encounter: Secondary | ICD-10-CM

## 2012-09-27 DIAGNOSIS — F1120 Opioid dependence, uncomplicated: Secondary | ICD-10-CM

## 2012-09-27 DIAGNOSIS — R4182 Altered mental status, unspecified: Secondary | ICD-10-CM

## 2012-09-27 DIAGNOSIS — T400X1A Poisoning by opium, accidental (unintentional), initial encounter: Secondary | ICD-10-CM

## 2012-09-27 DIAGNOSIS — Z8719 Personal history of other diseases of the digestive system: Secondary | ICD-10-CM

## 2012-09-27 DIAGNOSIS — R0902 Hypoxemia: Secondary | ICD-10-CM

## 2012-09-27 DIAGNOSIS — J9600 Acute respiratory failure, unspecified whether with hypoxia or hypercapnia: Secondary | ICD-10-CM

## 2012-09-27 LAB — BASIC METABOLIC PANEL
Calcium: 9.3 mg/dL (ref 8.4–10.5)
GFR calc Af Amer: >90 mL/min (ref >90)
GFR calc non Af Amer: >90 mL/min (ref >90)
Potassium: 3 mEq/L — ABNORMAL LOW (ref 3.5–5.1)
Sodium: 137 mEq/L (ref 135–145)

## 2012-09-27 LAB — HEPATIC FUNCTION PANEL
ALT: 11 U/L (ref 0–53)
AST: 14 U/L (ref 0–37)
Alkaline Phosphatase: 57 U/L (ref 39–117)
Total Protein: 5.9 g/dL — ABNORMAL LOW (ref 6.0–8.3)

## 2012-09-27 LAB — CBC
Hemoglobin: 12.6 g/dL — ABNORMAL LOW (ref 13.0–17.0)
MCHC: 34.1 g/dL (ref 30.0–36.0)
Platelets: 247 10*3/uL (ref 150–400)
RDW: 13.8 % (ref 11.5–15.5)

## 2012-09-27 LAB — PHOSPHORUS: Phosphorus: 2.2 mg/dL — ABNORMAL LOW (ref 2.3–4.6)

## 2012-09-27 MED ORDER — BIOTENE DRY MOUTH MT LIQD
15.0000 mL | Freq: Four times a day (QID) | OROMUCOSAL | Status: DC
  Administered 2012-09-27 – 2012-09-29 (×7): 15 mL via OROMUCOSAL

## 2012-09-27 MED ORDER — POTASSIUM PHOSPHATE DIBASIC 3 MMOLE/ML IV SOLN
30.0000 mmol | Freq: Once | INTRAVENOUS | Status: AC
  Administered 2012-09-27: 30 mmol via INTRAVENOUS
  Filled 2012-09-27: qty 10
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
## 2012-09-28 ENCOUNTER — Encounter (HOSPITAL_COMMUNITY): Payer: Self-pay

## 2012-09-28 ENCOUNTER — Inpatient Hospital Stay (HOSPITAL_COMMUNITY): Payer: Medicare Other

## 2012-09-28 LAB — BASIC METABOLIC PANEL
BUN: 6 mg/dL (ref 6–23)
CO2: 24 mEq/L (ref 19–32)
Calcium: 9.6 mg/dL (ref 8.4–10.5)
Chloride: 107 mEq/L (ref 96–112)
Creatinine, Ser: 0.72 mg/dL (ref 0.50–1.35)
Glucose, Bld: 112 mg/dL — ABNORMAL HIGH (ref 70–99)

## 2012-09-28 LAB — CBC
HCT: 39.2 % (ref 39.0–52.0)
Hemoglobin: 13.4 g/dL (ref 13.0–17.0)
MCH: 28.7 pg (ref 26.0–34.0)
MCV: 83.9 fL (ref 78.0–100.0)
Platelets: 235 10*3/uL (ref 150–400)
RBC: 4.67 MIL/uL (ref 4.22–5.81)
WBC: 6.7 10*3/uL (ref 4.0–10.5)

## 2012-09-28 LAB — HEPATIC FUNCTION PANEL
ALT: 11 U/L (ref 0–53)
AST: 12 U/L (ref 0–37)
Albumin: 3.6 g/dL (ref 3.5–5.2)
Total Bilirubin: 0.9 mg/dL (ref 0.3–1.2)

## 2012-09-28 LAB — MAGNESIUM: Magnesium: 2 mg/dL (ref 1.5–2.5)

## 2012-09-28 MED ORDER — PANCRELIPASE (LIP-PROT-AMYL) 12000-38000 UNITS PO CPEP
2.0000 | ORAL_CAPSULE | Freq: Three times a day (TID) | ORAL | Status: DC
  Administered 2012-09-28 – 2012-09-30 (×5): 2 via ORAL
  Filled 2012-09-28 (×10): qty 2

## 2012-09-28 MED ORDER — PNEUMOCOCCAL VAC POLYVALENT 25 MCG/0.5ML IJ INJ
0.5000 mL | INJECTION | INTRAMUSCULAR | Status: AC
  Administered 2012-09-29: 0.5 mL via INTRAMUSCULAR
  Filled 2012-09-28: qty 0.5

## 2012-09-28 MED ORDER — QUETIAPINE FUMARATE 50 MG PO TABS
150.0000 mg | ORAL_TABLET | Freq: Every day | ORAL | Status: DC
  Administered 2012-09-29 (×2): 150 mg via ORAL
  Filled 2012-09-28 (×3): qty 1

## 2012-09-28 MED ORDER — POTASSIUM CHLORIDE CRYS ER 20 MEQ PO TBCR
40.0000 meq | EXTENDED_RELEASE_TABLET | Freq: Once | ORAL | Status: AC
  Administered 2012-09-28: 40 meq via ORAL
  Filled 2012-09-28: qty 1
  Filled 2012-09-28: qty 2

## 2012-09-28 MED ORDER — PANTOPRAZOLE SODIUM 40 MG PO TBEC
40.0000 mg | DELAYED_RELEASE_TABLET | Freq: Every day | ORAL | Status: DC
  Administered 2012-09-29 – 2012-09-30 (×3): 40 mg via ORAL
  Filled 2012-09-28 (×3): qty 1

## 2012-09-28 MED ORDER — TRAMADOL HCL 50 MG PO TABS
50.0000 mg | ORAL_TABLET | Freq: Four times a day (QID) | ORAL | Status: DC | PRN
  Administered 2012-09-28 – 2012-09-30 (×3): 50 mg via ORAL
  Filled 2012-09-28 (×3): qty 1

## 2012-09-28 MED ORDER — POTASSIUM CHLORIDE 10 MEQ/100ML IV SOLN
10.0000 meq | INTRAVENOUS | Status: AC
  Administered 2012-09-28 (×2): 10 meq via INTRAVENOUS
  Filled 2012-09-28: qty 200

## 2012-09-28 MED ORDER — SODIUM BICARBONATE 8.4 % IV SOLN
INTRAVENOUS | Status: DC
  Filled 2012-09-28 (×2): qty 150

## 2012-09-28 NOTE — Progress Notes (Deleted)
Acidosis   Initiate bicarb drip.
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
## 2012-09-28 NOTE — Consult Note (Signed)
Patient Identification:  Joe Lawrence Date of Evaluation:  09/28/2012 Reason for Consult:  [facility] for pain medications, local pharmacy; claims pain  Referring Provider: Dr. Kendrick Fries  History of Present Illness:Consultation deferred until 09/29/12    Past Psychiatric History:  No Psychiatrist seen.  Past Medical History:     Past Medical History  Diagnosis Date  . Chronic pancreatitis   . Anxiety   . Tobacco abuse   . PPD positive   . Bipolar disorder   . Pancreatitis   . Bronchitis   . Hx of suicide attempt     x 3  . Chronic back pain        Past Surgical History  Procedure Date  . Right arm surgery   . Epidural steroid injection 02/23/2011    Allergies:  Allergies  Allergen Reactions  . Chlorpromazine Hcl   . Iodine   . Sulfonamide Derivatives   . Voltaren (Diclofenac Sodium)     Current Medications:  Prior to Admission medications   Medication Sig Start Date End Date Taking? Authorizing Provider  citalopram (CELEXA) 40 MG tablet Take 60 mg by mouth daily.   Yes Historical Provider, MD  fentaNYL (DURAGESIC - DOSED MCG/HR) 75 MCG/HR Place 1 patch onto the skin every 3 (three) days.   Yes Historical Provider, MD  Oxycodone HCl 10 MG TABS Take 10 mg by mouth 3 (three) times daily.    Yes Historical Provider, MD  Pancrelipase, Lip-Prot-Amyl, (CREON) 24000 UNITS CPEP Take 1-2 capsules by mouth 3 (three) times daily with meals.   Yes Historical Provider, MD  pantoprazole (PROTONIX) 40 MG tablet Take 40 mg by mouth daily.   Yes Historical Provider, MD  QUEtiapine (SEROQUEL) 50 MG tablet Take 150 mg by mouth at bedtime.   Yes Historical Provider, MD  tiZANidine (ZANAFLEX) 4 MG tablet Take 4 mg by mouth every 8 (eight) hours as needed. For spasms or cramping.   Yes Historical Provider, MD    Social History:    reports that he has been smoking Cigarettes.  He has a 2 pack-year smoking history. He has never used smokeless tobacco. He reports that he does not drink alcohol  or use illicit drugs.   Family History:    History reviewed. No pertinent family history.  Assessment/Plan:  Too agitated, 5 security officers outside the room. 5:30pm RECOMMENDATION: Continue evaluation 09/29/12 Joe Lawrence J. Ferol Luz, MD Psychiatrist  09/28/2012 1:12 PM

## 2012-09-28 NOTE — Progress Notes (Signed)
Pain   Tramadol ordered
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
## 2012-09-29 DIAGNOSIS — F429 Obsessive-compulsive disorder, unspecified: Secondary | ICD-10-CM

## 2012-09-29 DIAGNOSIS — T394X1A Poisoning by antirheumatics, not elsewhere classified, accidental (unintentional), initial encounter: Secondary | ICD-10-CM
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
## 2012-09-30 MED ORDER — TRAMADOL HCL 50 MG PO TABS: 50.0000 mg | ORAL_TABLET | Freq: Four times a day (QID) | ORAL | Status: DC | PRN

## 2012-09-30 NOTE — Progress Notes (Addendum)
Met with Pt to discuss psych MD's d/c recommendations.  Pt stated that he does not want to go to [facility] but is willing to seek outpt tx.  Pt states that he saw Dr. Evelene Croon for 3 years due to his anxiety.  He stated that she prescribed him benzos and that he chose to stop seeing her due to this.  Provided Pt with information on [facility].    CSW thanked Pt for his time.  Care Coordinator, Aram Beecham, that Pt has been cleared by psych.  Pt to be d/c'd.  Providence Crosby, LCSWA Clinical Social Work [phone]

## 2012-09-30 NOTE — Discharge Summary (Signed)
Coralyn Helling, MD [facility] Pulmonary/Critical Care 09/30/2012, 12:44 PM Pager:  [phone] After 3pm call: [phone]

## 2012-09-30 NOTE — Progress Notes (Signed)
Patient discharge home with mother, alert and oriented, discharge instructions given patient verbalize understanding of discharge instructions, patient with no other concerns at this time, patient in stable condition at this time

## 2012-11-11 ENCOUNTER — Encounter (HOSPITAL_COMMUNITY): Payer: Self-pay | Admitting: *Deleted

## 2012-11-11 ENCOUNTER — Emergency Department (HOSPITAL_COMMUNITY)
Admission: EM | Admit: 2012-11-11 | Discharge: 2012-11-12 | Disposition: A | Discharge status: Other Acute Inpatient Hospital | Payer: Medicare Other | Source: Home / Self Care

## 2012-11-11 DIAGNOSIS — F1910 Other psychoactive substance abuse, uncomplicated: Secondary | ICD-10-CM | POA: Insufficient documentation

## 2012-11-11 DIAGNOSIS — F411 Generalized anxiety disorder: Secondary | ICD-10-CM | POA: Insufficient documentation

## 2012-11-11 DIAGNOSIS — R7611 Nonspecific reaction to tuberculin skin test without active tuberculosis: Secondary | ICD-10-CM | POA: Insufficient documentation

## 2012-11-11 DIAGNOSIS — Z8709 Personal history of other diseases of the respiratory system: Secondary | ICD-10-CM | POA: Insufficient documentation

## 2012-11-11 DIAGNOSIS — F17200 Nicotine dependence, unspecified, uncomplicated: Secondary | ICD-10-CM | POA: Insufficient documentation

## 2012-11-11 DIAGNOSIS — G8929 Other chronic pain: Secondary | ICD-10-CM | POA: Insufficient documentation

## 2012-11-11 DIAGNOSIS — K861 Other chronic pancreatitis: Secondary | ICD-10-CM | POA: Insufficient documentation

## 2012-11-11 DIAGNOSIS — Z79899 Other long term (current) drug therapy: Secondary | ICD-10-CM | POA: Insufficient documentation

## 2012-11-11 DIAGNOSIS — F319 Bipolar disorder, unspecified: Secondary | ICD-10-CM | POA: Insufficient documentation

## 2012-11-11 DIAGNOSIS — M549 Dorsalgia, unspecified: Secondary | ICD-10-CM | POA: Insufficient documentation

## 2012-11-11 LAB — CBC
Hemoglobin: 14.5 g/dL (ref 13.0–17.0)
MCH: 30.8 pg (ref 26.0–34.0)
MCV: 89.2 fL (ref 78.0–100.0)
Platelets: 277 10*3/uL (ref 150–400)
RBC: 4.71 MIL/uL (ref 4.22–5.81)
WBC: 6.5 10*3/uL (ref 4.0–10.5)

## 2012-11-11 LAB — COMPREHENSIVE METABOLIC PANEL
ALT: 28 U/L (ref 0–53)
AST: 41 U/L — ABNORMAL HIGH (ref 0–37)
CO2: 29 mEq/L (ref 19–32)
Calcium: 9.5 mg/dL (ref 8.4–10.5)
Chloride: 94 mEq/L — ABNORMAL LOW (ref 96–112)
Creatinine, Ser: 0.7 mg/dL (ref 0.50–1.35)
GFR calc Af Amer: >90 mL/min (ref >90)
GFR calc non Af Amer: >90 mL/min (ref >90)
Glucose, Bld: 124 mg/dL — ABNORMAL HIGH (ref 70–99)
Sodium: 136 mEq/L (ref 135–145)
Total Bilirubin: 0.4 mg/dL (ref 0.3–1.2)

## 2012-11-11 LAB — RAPID URINE DRUG SCREEN, HOSP PERFORMED
Amphetamines: NOT DETECTED
Barbiturates: NOT DETECTED
Opiates: NOT DETECTED
Tetrahydrocannabinol: NOT DETECTED

## 2012-11-11 MED ORDER — DICYCLOMINE HCL 20 MG PO TABS: 20.0000 mg | ORAL_TABLET | Freq: Four times a day (QID) | ORAL | Status: DC | PRN

## 2012-11-11 MED ORDER — METHOCARBAMOL 500 MG PO TABS
500.0000 mg | ORAL_TABLET | Freq: Three times a day (TID) | ORAL | Status: DC | PRN
  Administered 2012-11-12: 500 mg via ORAL
  Filled 2012-11-11: qty 1

## 2012-11-11 MED ORDER — LOPERAMIDE HCL 2 MG PO CAPS: 2.0000 mg | ORAL_CAPSULE | ORAL | Status: DC | PRN

## 2012-11-11 MED ORDER — THIAMINE HCL 100 MG/ML IJ SOLN: 100.0000 mg | Freq: Every day | INTRAMUSCULAR | Status: DC

## 2012-11-11 MED ORDER — NICOTINE 21 MG/24HR TD PT24
21.0000 mg | MEDICATED_PATCH | Freq: Every day | TRANSDERMAL | Status: DC
  Administered 2012-11-11 – 2012-11-12 (×2): 21 mg via TRANSDERMAL
  Filled 2012-11-11 (×2): qty 1

## 2012-11-11 MED ORDER — VITAMIN B-1 100 MG PO TABS
100.0000 mg | ORAL_TABLET | Freq: Every day | ORAL | Status: DC
  Administered 2012-11-11 – 2012-11-12 (×2): 100 mg via ORAL
  Filled 2012-11-11 (×2): qty 1

## 2012-11-11 MED ORDER — LORAZEPAM 1 MG PO TABS: 0.0000 mg | ORAL_TABLET | Freq: Two times a day (BID) | ORAL | Status: DC

## 2012-11-11 MED ORDER — LORAZEPAM 1 MG PO TABS
0.0000 mg | ORAL_TABLET | Freq: Four times a day (QID) | ORAL | Status: DC
  Administered 2012-11-12: 1 mg via ORAL

## 2012-11-11 MED ORDER — CLONIDINE HCL 0.1 MG PO TABS: 0.1000 mg | ORAL_TABLET | Freq: Every day | ORAL | Status: DC

## 2012-11-11 MED ORDER — FOLIC ACID 1 MG PO TABS
1.0000 mg | ORAL_TABLET | Freq: Every day | ORAL | Status: DC
  Administered 2012-11-11 – 2012-11-12 (×2): 1 mg via ORAL
  Filled 2012-11-11 (×2): qty 1

## 2012-11-11 MED ORDER — ACETAMINOPHEN 325 MG PO TABS: 650.0000 mg | ORAL_TABLET | ORAL | Status: DC | PRN

## 2012-11-11 MED ORDER — ONDANSETRON 4 MG PO TBDP: 4.0000 mg | ORAL_TABLET | Freq: Four times a day (QID) | ORAL | Status: DC | PRN

## 2012-11-11 MED ORDER — CLONIDINE HCL 0.1 MG PO TABS: 0.1000 mg | ORAL_TABLET | ORAL | Status: DC

## 2012-11-11 MED ORDER — CLONIDINE HCL 0.1 MG PO TABS
0.1000 mg | ORAL_TABLET | Freq: Four times a day (QID) | ORAL | Status: DC
  Administered 2012-11-11 – 2012-11-12 (×2): 0.1 mg via ORAL
  Filled 2012-11-11 (×2): qty 1

## 2012-11-11 MED ORDER — LORAZEPAM 1 MG PO TABS
1.0000 mg | ORAL_TABLET | Freq: Four times a day (QID) | ORAL | Status: DC | PRN
  Administered 2012-11-11 – 2012-11-12 (×2): 1 mg via ORAL
  Filled 2012-11-11 (×3): qty 1

## 2012-11-11 MED ORDER — HYDROXYZINE HCL 25 MG PO TABS: 25.0000 mg | ORAL_TABLET | Freq: Four times a day (QID) | ORAL | Status: DC | PRN

## 2012-11-11 NOTE — ED Provider Notes (Signed)
History     CSN: [id number]  Arrival date & time 11/11/12  1946   First MD Initiated Contact with Patient 11/11/12 2023      Chief Complaint  Patient presents with  . Medical Clearance    (Consider location/radiation/quality/duration/timing/severity/associated sxs/prior treatment) The history is provided by the patient.  pt states wants detox/rehab program for alcohol and narcotic pain medication abuse. States has been abusing meds for long time, months/years. States uses prescription meds, percocet, fentanyl, and heroine. Cant quantify amount. Last used today. Denies hx etoh withdrawal seizure or dts. No abd pain. Hx chronic pancreatitis. No nv. No diarrhea. States feels depressed at times, no thoughts of harm to self or others. Denies overdose or attempt at self harm. States physical health at baseline, denies other symptoms.      Past Medical History  Diagnosis Date  . Chronic pancreatitis   . Anxiety   . Tobacco abuse   . PPD positive   . Bipolar disorder   . Pancreatitis   . Bronchitis   . Hx of suicide attempt     x 3  . Chronic back pain     Past Surgical History  Procedure Date  . Right arm surgery   . Epidural steroid injection 02/23/2011    History reviewed. No pertinent family history.  History  Substance Use Topics  . Smoking status: Current Every Day Smoker -- 1.0 packs/day for 4 years    Types: Cigarettes  . Smokeless tobacco: Never Used  . Alcohol Use: Yes     Comment: equivalent to x24 beers daily      Review of Systems  Constitutional: Negative for fever and chills.  HENT: Negative for neck pain.   Eyes: Negative for visual disturbance.  Respiratory: Negative for cough and shortness of breath.   Cardiovascular: Negative for chest pain.  Gastrointestinal: Negative for vomiting, abdominal pain and diarrhea.  Genitourinary: Negative for flank pain.  Musculoskeletal: Negative for back pain.  Skin: Negative for rash.  Neurological: Negative  for headaches.  Hematological: Does not bruise/bleed easily.  Psychiatric/Behavioral: Negative for confusion.    Allergies  Chlorpromazine hcl; Iodine; Sulfonamide derivatives; and Voltaren  Home Medications   Current Outpatient Rx  Name  Route  Sig  Dispense  Refill  . CITALOPRAM HYDROBROMIDE 40 MG PO TABS   Oral   Take 60 mg by mouth daily.         . FENTANYL 75 MCG/HR TD PT72   Transdermal   Place 1 patch onto the skin every 3 (three) days.         . OXYCODONE HCL 15 MG PO TABS   Oral   Take 15 mg by mouth every 4 (four) hours as needed. For pain.         Marland Kitchen PANCRELIPASE (LIP-PROT-AMYL) 24000 UNITS PO CPEP   Oral   Take 1-2 capsules by mouth 3 (three) times daily with meals.         Marland Kitchen PANTOPRAZOLE SODIUM 40 MG PO TBEC   Oral   Take 40 mg by mouth daily.         . QUETIAPINE FUMARATE 50 MG PO TABS   Oral   Take 150 mg by mouth at bedtime.           BP 164/97  Pulse 86  Temp 98.4 F (36.9 C) (Oral)  Resp 18  Ht 5\' 10"  (1.778 m)  Wt 165 lb (74.844 kg)  BMI 23.68 kg/m2  SpO2 96%  Physical  Exam  Nursing note and vitals reviewed. Constitutional: He is oriented to person, place, and time. He appears well-developed and well-nourished. No distress.  HENT:  Nose: Nose normal.  Mouth/Throat: Oropharynx is clear and moist.  Eyes: Pupils are equal, round, and reactive to light. No scleral icterus.  Neck: Neck supple. No tracheal deviation present.  Cardiovascular: Normal rate, regular rhythm, normal heart sounds and intact distal pulses.   Pulmonary/Chest: Effort normal and breath sounds normal. No accessory muscle usage. No respiratory distress.  Abdominal: Soft. Bowel sounds are normal. He exhibits no distension. There is no tenderness.  Musculoskeletal: Normal range of motion. He exhibits no edema and no tenderness.  Neurological: He is alert and oriented to person, place, and time.       Steady gait.  Skin: Skin is warm and dry.  Psychiatric:        Anxious.     ED Course  Procedures (including critical care time)   Labs Reviewed  ACETAMINOPHEN LEVEL  CBC  COMPREHENSIVE METABOLIC PANEL  ETHANOL  SALICYLATE LEVEL  URINE RAPID DRUG SCREEN (HOSP PERFORMED)   Results for orders placed during the hospital encounter of 11/11/12  ACETAMINOPHEN LEVEL      Component Value Range   Acetaminophen (Tylenol), Serum <15.0  10 - 30 ug/mL  CBC      Component Value Range   WBC 6.5  4.0 - 10.5 K/uL   RBC 4.71  4.22 - 5.81 MIL/uL   Hemoglobin 14.5  13.0 - 17.0 g/dL   HCT 16.1  09.6 - 04.5 %   MCV 89.2  78.0 - 100.0 fL   MCH 30.8  26.0 - 34.0 pg   MCHC 34.5  30.0 - 36.0 g/dL   RDW 40.9 (*) 81.1 - 91.4 %   Platelets 277  150 - 400 K/uL  COMPREHENSIVE METABOLIC PANEL      Component Value Range   Sodium 136  135 - 145 mEq/L   Potassium 3.6  3.5 - 5.1 mEq/L   Chloride 94 (*) 96 - 112 mEq/L   CO2 29  19 - 32 mEq/L   Glucose, Bld 124 (*) 70 - 99 mg/dL   BUN 5 (*) 6 - 23 mg/dL   Creatinine, Ser 7.82  0.50 - 1.35 mg/dL   Calcium 9.5  8.4 - 95.6 mg/dL   Total Protein 8.0  6.0 - 8.3 g/dL   Albumin 4.4  3.5 - 5.2 g/dL   AST 41 (*) 0 - 37 U/L   ALT 28  0 - 53 U/L   Alkaline Phosphatase 133 (*) 39 - 117 U/L   Total Bilirubin 0.4  0.3 - 1.2 mg/dL   GFR calc non Af Amer >90  >90 mL/min   GFR calc Af Amer >90  >90 mL/min  ETHANOL      Component Value Range   Alcohol, Ethyl (B) 187 (*) 0 - 11 mg/dL  SALICYLATE LEVEL      Component Value Range   Salicylate Lvl <2.0 (*) 2.8 - 20.0 mg/dL  URINE RAPID DRUG SCREEN (HOSP PERFORMED)      Component Value Range   Opiates NONE DETECTED  NONE DETECTED   Cocaine NONE DETECTED  NONE DETECTED   Benzodiazepines NONE DETECTED  NONE DETECTED   Amphetamines NONE DETECTED  NONE DETECTED   Tetrahydrocannabinol NONE DETECTED  NONE DETECTED   Barbiturates NONE DETECTED  NONE DETECTED        MDM  Labs. Discussed w act team.   Reviewed nursing  notes and prior charts for additional history.   Recheck  alert. Nad. Discussed w act team. Will place on ciwa protocol for etoh, clonidine protocol for opiate.   Sign out to oncoming edp.         Suzi Roots, MD 11/11/12 2147

## 2012-11-11 NOTE — ED Notes (Signed)
Pt here requesting detox from heroin, rx pain meds, and etoh - pt is prescribed fentanyl and oxycodone for pain r/t pancreatitis and recently x1 month began abusing heroin as well. Pt admits to SI w/ a plan to hang or cut himself. Denies HI.
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
## 2012-11-12 ENCOUNTER — Inpatient Hospital Stay (HOSPITAL_COMMUNITY)
Admission: AD | Admit: 2012-11-12 | Discharge: 2012-11-23 | DRG: 897 | Disposition: A | Discharge status: Home / Self Care | Payer: Medicare Other | Source: Ambulatory Visit | Attending: Psychiatry | Admitting: Psychiatry

## 2012-11-12 ENCOUNTER — Encounter (HOSPITAL_COMMUNITY): Payer: Self-pay | Admitting: *Deleted

## 2012-11-12 DIAGNOSIS — F17200 Nicotine dependence, unspecified, uncomplicated: Secondary | ICD-10-CM | POA: Diagnosis present

## 2012-11-12 DIAGNOSIS — F1123 Opioid dependence with withdrawal: Secondary | ICD-10-CM

## 2012-11-12 DIAGNOSIS — M549 Dorsalgia, unspecified: Secondary | ICD-10-CM

## 2012-11-12 DIAGNOSIS — Z8719 Personal history of other diseases of the digestive system: Secondary | ICD-10-CM

## 2012-11-12 DIAGNOSIS — R4182 Altered mental status, unspecified: Secondary | ICD-10-CM

## 2012-11-12 DIAGNOSIS — F10239 Alcohol dependence with withdrawal, unspecified: Secondary | ICD-10-CM | POA: Diagnosis present

## 2012-11-12 DIAGNOSIS — R0902 Hypoxemia: Secondary | ICD-10-CM

## 2012-11-12 DIAGNOSIS — F1021 Alcohol dependence, in remission: Secondary | ICD-10-CM

## 2012-11-12 DIAGNOSIS — M5410 Radiculopathy, site unspecified: Secondary | ICD-10-CM

## 2012-11-12 DIAGNOSIS — K861 Other chronic pancreatitis: Secondary | ICD-10-CM | POA: Diagnosis present

## 2012-11-12 DIAGNOSIS — F1020 Alcohol dependence, uncomplicated: Secondary | ICD-10-CM | POA: Diagnosis present

## 2012-11-12 DIAGNOSIS — F1120 Opioid dependence, uncomplicated: Principal | ICD-10-CM | POA: Diagnosis present

## 2012-11-12 DIAGNOSIS — J9600 Acute respiratory failure, unspecified whether with hypoxia or hypercapnia: Secondary | ICD-10-CM

## 2012-11-12 DIAGNOSIS — F10939 Alcohol use, unspecified with withdrawal, unspecified: Secondary | ICD-10-CM | POA: Diagnosis present

## 2012-11-12 DIAGNOSIS — F1210 Cannabis abuse, uncomplicated: Secondary | ICD-10-CM | POA: Diagnosis present

## 2012-11-12 DIAGNOSIS — J029 Acute pharyngitis, unspecified: Secondary | ICD-10-CM

## 2012-11-12 DIAGNOSIS — F429 Obsessive-compulsive disorder, unspecified: Secondary | ICD-10-CM

## 2012-11-12 MED ORDER — VITAMIN B-1 100 MG PO TABS
100.0000 mg | ORAL_TABLET | Freq: Every day | ORAL | Status: DC
  Administered 2012-11-13 – 2012-11-23 (×11): 100 mg via ORAL
  Filled 2012-11-12 (×13): qty 1

## 2012-11-12 MED ORDER — CHLORDIAZEPOXIDE HCL 25 MG PO CAPS
50.0000 mg | ORAL_CAPSULE | Freq: Once | ORAL | Status: AC
  Administered 2012-11-12: 50 mg via ORAL
  Filled 2012-11-12: qty 2

## 2012-11-12 MED ORDER — ADULT MULTIVITAMIN W/MINERALS CH
1.0000 | ORAL_TABLET | Freq: Every day | ORAL | Status: DC
  Administered 2012-11-12 – 2012-11-18 (×5): 1 via ORAL
  Filled 2012-11-12 (×15): qty 1

## 2012-11-12 MED ORDER — CLONIDINE HCL 0.1 MG PO TABS
0.1000 mg | ORAL_TABLET | ORAL | Status: AC
  Administered 2012-11-15 – 2012-11-16 (×4): 0.1 mg via ORAL
  Filled 2012-11-12 (×4): qty 1

## 2012-11-12 MED ORDER — CITALOPRAM HYDROBROMIDE 40 MG PO TABS
60.0000 mg | ORAL_TABLET | Freq: Every day | ORAL | Status: DC
  Filled 2012-11-12 (×2): qty 1

## 2012-11-12 MED ORDER — LOPERAMIDE HCL 2 MG PO CAPS: 2.0000 mg | ORAL_CAPSULE | ORAL | Status: AC | PRN

## 2012-11-12 MED ORDER — PANTOPRAZOLE SODIUM 40 MG PO TBEC
40.0000 mg | DELAYED_RELEASE_TABLET | Freq: Every day | ORAL | Status: DC
  Administered 2012-11-12: 40 mg via ORAL
  Filled 2012-11-12: qty 1

## 2012-11-12 MED ORDER — ONDANSETRON 4 MG PO TBDP: 4.0000 mg | ORAL_TABLET | Freq: Four times a day (QID) | ORAL | Status: DC | PRN

## 2012-11-12 MED ORDER — CHLORDIAZEPOXIDE HCL 25 MG PO CAPS
25.0000 mg | ORAL_CAPSULE | Freq: Four times a day (QID) | ORAL | Status: AC
  Administered 2012-11-12 – 2012-11-14 (×6): 25 mg via ORAL
  Filled 2012-11-12 (×6): qty 1

## 2012-11-12 MED ORDER — ONDANSETRON 4 MG PO TBDP
4.0000 mg | ORAL_TABLET | Freq: Four times a day (QID) | ORAL | Status: AC | PRN
  Administered 2012-11-14 – 2012-11-15 (×2): 4 mg via ORAL

## 2012-11-12 MED ORDER — CLONIDINE HCL 0.1 MG PO TABS
0.1000 mg | ORAL_TABLET | Freq: Every day | ORAL | Status: AC
  Administered 2012-11-17 – 2012-11-18 (×2): 0.1 mg via ORAL
  Filled 2012-11-12 (×2): qty 1

## 2012-11-12 MED ORDER — QUETIAPINE FUMARATE 50 MG PO TABS
150.0000 mg | ORAL_TABLET | Freq: Every day | ORAL | Status: DC
  Administered 2012-11-12 – 2012-11-19 (×8): 150 mg via ORAL
  Filled 2012-11-12 (×12): qty 1

## 2012-11-12 MED ORDER — CLONIDINE HCL 0.1 MG PO TABS
0.1000 mg | ORAL_TABLET | Freq: Four times a day (QID) | ORAL | Status: AC
  Administered 2012-11-12 – 2012-11-14 (×10): 0.1 mg via ORAL
  Filled 2012-11-12 (×12): qty 1

## 2012-11-12 MED ORDER — METHOCARBAMOL 500 MG PO TABS
500.0000 mg | ORAL_TABLET | Freq: Three times a day (TID) | ORAL | Status: AC | PRN
  Administered 2012-11-13 – 2012-11-17 (×9): 500 mg via ORAL
  Filled 2012-11-12 (×9): qty 1

## 2012-11-12 MED ORDER — THIAMINE HCL 100 MG/ML IJ SOLN
100.0000 mg | Freq: Once | INTRAMUSCULAR | Status: AC
  Administered 2012-11-12: 100 mg via INTRAMUSCULAR

## 2012-11-12 MED ORDER — CHLORDIAZEPOXIDE HCL 25 MG PO CAPS
25.0000 mg | ORAL_CAPSULE | Freq: Four times a day (QID) | ORAL | Status: AC | PRN
  Administered 2012-11-13 – 2012-11-15 (×6): 25 mg via ORAL
  Filled 2012-11-12 (×6): qty 1

## 2012-11-12 MED ORDER — QUETIAPINE FUMARATE 100 MG PO TABS
150.0000 mg | ORAL_TABLET | Freq: Every day | ORAL | Status: DC
Start: 2012-11-12 — End: 2012-11-12
  Administered 2012-11-12: 150 mg via ORAL
  Filled 2012-11-12: qty 1

## 2012-11-12 MED ORDER — CHLORDIAZEPOXIDE HCL 25 MG PO CAPS
25.0000 mg | ORAL_CAPSULE | Freq: Three times a day (TID) | ORAL | Status: AC
  Administered 2012-11-14 – 2012-11-15 (×3): 25 mg via ORAL
  Filled 2012-11-12 (×3): qty 1

## 2012-11-12 MED ORDER — LOPERAMIDE HCL 2 MG PO CAPS: 2.0000 mg | ORAL_CAPSULE | ORAL | Status: DC | PRN

## 2012-11-12 MED ORDER — CHLORDIAZEPOXIDE HCL 25 MG PO CAPS
25.0000 mg | ORAL_CAPSULE | Freq: Every day | ORAL | Status: AC
  Administered 2012-11-17: 25 mg via ORAL
  Filled 2012-11-12: qty 1

## 2012-11-12 MED ORDER — PANTOPRAZOLE SODIUM 40 MG PO TBEC
40.0000 mg | DELAYED_RELEASE_TABLET | Freq: Every day | ORAL | Status: DC
  Administered 2012-11-13 – 2012-11-23 (×11): 40 mg via ORAL
  Filled 2012-11-12 (×11): qty 1
  Filled 2012-11-12: qty 7
  Filled 2012-11-12: qty 1

## 2012-11-12 MED ORDER — CHLORDIAZEPOXIDE HCL 25 MG PO CAPS
25.0000 mg | ORAL_CAPSULE | ORAL | Status: AC
  Administered 2012-11-15 – 2012-11-16 (×2): 25 mg via ORAL
  Filled 2012-11-12 (×2): qty 1

## 2012-11-12 MED ORDER — PANCRELIPASE (LIP-PROT-AMYL) 12000-38000 UNITS PO CPEP
1.0000 | ORAL_CAPSULE | Freq: Three times a day (TID) | ORAL | Status: DC
  Administered 2012-11-12 – 2012-11-23 (×33): 1 via ORAL
  Filled 2012-11-12 (×10): qty 1
  Filled 2012-11-12: qty 9
  Filled 2012-11-12 (×20): qty 1
  Filled 2012-11-12: qty 9
  Filled 2012-11-12 (×6): qty 1
  Filled 2012-11-12: qty 9

## 2012-11-12 MED ORDER — HYDROXYZINE HCL 25 MG PO TABS: 25.0000 mg | ORAL_TABLET | Freq: Four times a day (QID) | ORAL | Status: DC | PRN

## 2012-11-12 MED ORDER — NAPROXEN 500 MG PO TABS
500.0000 mg | ORAL_TABLET | Freq: Two times a day (BID) | ORAL | Status: AC | PRN
  Administered 2012-11-14 – 2012-11-15 (×2): 500 mg via ORAL
  Filled 2012-11-12 (×2): qty 1

## 2012-11-12 MED ORDER — CITALOPRAM HYDROBROMIDE 40 MG PO TABS
60.0000 mg | ORAL_TABLET | Freq: Every day | ORAL | Status: DC
  Administered 2012-11-12: 60 mg via ORAL
  Filled 2012-11-12: qty 1

## 2012-11-12 MED ORDER — PANCRELIPASE (LIP-PROT-AMYL) 12000-38000 UNITS PO CPEP
1.0000 | ORAL_CAPSULE | Freq: Three times a day (TID) | ORAL | Status: DC
  Administered 2012-11-12: 1 via ORAL
  Filled 2012-11-12 (×4): qty 1

## 2012-11-12 MED ORDER — DICYCLOMINE HCL 20 MG PO TABS
20.0000 mg | ORAL_TABLET | Freq: Four times a day (QID) | ORAL | Status: AC | PRN
  Administered 2012-11-14 – 2012-11-16 (×5): 20 mg via ORAL
  Filled 2012-11-12 (×5): qty 1

## 2012-11-12 NOTE — ED Provider Notes (Signed)
Filed Vitals:   11/12/12 0627  BP: 129/88  Pulse: 90  Temp: 97.6 F (36.4 C)  Resp: 20   Pt seen and assessed. NAD. Here for detox and depression. Abuses benzos, opiates and etoh. On CIWA and clonidine protocols. Will continue to observe and attempt to place.  Raeford Razor, MD 11/12/12 [phone]

## 2012-11-12 NOTE — ED Notes (Signed)
Report received from [place], RN

## 2012-11-12 NOTE — ED Notes (Signed)
Pt concerned about withdrawal- requesting Suboxone treatment. States can get it at [facility], or other centers, Child psychotherapist speaking with pt giving options.

## 2012-11-12 NOTE — ED Notes (Signed)
Report called to Donna, RN at [facility].  
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
## 2012-11-12 NOTE — [facility] Counselor (Signed)
Met with patient regarding his disposition. Explained to patient that he was referred to [facility] for inpatient detox. Patient immediately stated, "Oh no I can't go their they don't due suboxene". Patient expressed that he only wants suboxene b/c, "It's the only thing that works". Patient asked to go to [facility] and it was explained to him that [facility] will not take him b/c he is insured with MCD/M[employer]. I informed patient that their were other options such as Dr. Runell Gess private suboxene clinic and other local [facility] clinics in the area. Patient agreed to the plan of following with suboxene referrals given (both [place] and [facility]. suboxene clinics were given).

## 2012-11-12 NOTE — [facility] Counselor (Signed)
Received a call from Fannie Knee in the Assessment office stating that patient's bed is available if patient is still interested. Shared this with patient, however; also explained that suboxene treatment would not be offered. Patient expressed that he wanted inpatient and also acknowledged he would not get suboxene. Patient accepted to [facility] by Dr. Dub Mikes Bed assignment is to the 300 hall. All support paperwork completed and faxed. Both patients nurse and EDP-Dr. Juleen China made aware of the changed in dispositioning.

## 2012-11-12 NOTE — ED Notes (Signed)
Patient complains of increase agitations. Respirations equal and unlabored. Skin warm and dry. No acute distress noted. Will continue to monitor patient.

## 2012-11-12 NOTE — Tx Team (Signed)
Initial Interdisciplinary Treatment Plan  PATIENT STRENGTHS: (choose at least two) Average or above average intelligence Communication skills General fund of knowledge Motivation for treatment/growth Supportive family/friends  PATIENT STRESSORS: Financial difficulties Health problems Legal issue Medication change or noncompliance Substance abuse   PROBLEM LIST: Problem List/Patient Goals Date to be addressed Date deferred Reason deferred Estimated date of resolution                                                         DISCHARGE CRITERIA:  Ability to meet basic life and health needs Adequate post-discharge living arrangements Improved stabilization in mood, thinking, and/or behavior Medical problems require only outpatient monitoring Motivation to continue treatment in a less acute level of care Need for constant or close observation no longer present Reduction of life-threatening or endangering symptoms to within safe limits Safe-care adequate arrangements made Verbal commitment to aftercare and medication compliance Withdrawal symptoms are absent or subacute and managed without 24-hour nursing intervention  PRELIMINARY DISCHARGE PLAN: Attend aftercare/continuing care group Attend PHP/IOP Attend 12-step recovery group Outpatient therapy Return to previous living arrangement  PATIENT/FAMIILY INVOLVEMENT: This treatment plan has been presented to and reviewed with the patient, Joe Lawrence.  The patient and family have been given the opportunity to ask questions and make suggestions.  Earline Mayotte 11/12/2012, 2:19 PM

## 2012-11-13 ENCOUNTER — Encounter (HOSPITAL_COMMUNITY): Payer: Self-pay | Admitting: Psychiatry

## 2012-11-13 DIAGNOSIS — J029 Acute pharyngitis, unspecified: Secondary | ICD-10-CM | POA: Diagnosis present

## 2012-11-13 DIAGNOSIS — F1020 Alcohol dependence, uncomplicated: Secondary | ICD-10-CM

## 2012-11-13 DIAGNOSIS — F1120 Opioid dependence, uncomplicated: Secondary | ICD-10-CM | POA: Diagnosis present

## 2012-11-13 DIAGNOSIS — F10239 Alcohol dependence with withdrawal, unspecified: Secondary | ICD-10-CM | POA: Diagnosis present

## 2012-11-13 DIAGNOSIS — F1123 Opioid dependence with withdrawal: Secondary | ICD-10-CM | POA: Diagnosis present

## 2012-11-13 DIAGNOSIS — F1193 Opioid use, unspecified with withdrawal: Secondary | ICD-10-CM | POA: Diagnosis present

## 2012-11-13 MED ORDER — NICOTINE 21 MG/24HR TD PT24
21.0000 mg | MEDICATED_PATCH | Freq: Every day | TRANSDERMAL | Status: DC
  Administered 2012-11-13 – 2012-11-15 (×2): 21 mg via TRANSDERMAL
  Filled 2012-11-13 (×6): qty 1

## 2012-11-13 MED ORDER — CEPASTAT 14.5 MG MT LOZG
1.0000 | LOZENGE | OROMUCOSAL | Status: DC | PRN
  Administered 2012-11-13: 1 via BUCCAL
  Filled 2012-11-13: qty 9

## 2012-11-13 MED ORDER — CITALOPRAM HYDROBROMIDE 40 MG PO TABS
40.0000 mg | ORAL_TABLET | Freq: Every day | ORAL | Status: DC
  Administered 2012-11-14 – 2012-11-23 (×10): 40 mg via ORAL
  Filled 2012-11-13 (×9): qty 1
  Filled 2012-11-13: qty 7
  Filled 2012-11-13 (×2): qty 1
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
## 2012-11-13 NOTE — Tx Team (Signed)
Interdisciplinary Treatment Plan Update (Adult)  Date:  11/13/2012  Time Reviewed:  11:09 AM   Progress in Treatment: Attending groups: Yes Participating in groups:  Yes Taking medication as prescribed: Yes Tolerating medication:  Yes Family/Significant othe contact made:  No, CSW assessing for appropriate contact Patient understands diagnosis:  Yes Discussing patient identified problems/goals with staff:  Yes Medical problems stabilized or resolved:  Yes Denies suicidal/homicidal ideation: Yes Issues/concerns per patient self-inventory:  None identified Other: N/A  New problem(s) identified: None Identified  Reason for Continuation of Hospitalization: Anxiety Depression Medication stabilization  Interventions implemented related to continuation of hospitalization: mood stabilization, medication monitoring and adjustment, group therapy and psycho education, suicide risk assessment, collateral contact, aftercare planning, ongoing physician assessments and safety checks q 15 mins  Additional comments: N/A  Estimated length of stay: 3-5 days  Discharge Plan: CSW assessing for appropriate referrals.   New goal(s): N/A  Review of initial/current patient goals per problem list:    1.  Goal(s): Address substance use by completing detox protocol  Met:  No  Target date: 11/20  As evidenced by: pt continues to be on librium detox protocol.    2.  Goal (s): Reduce depressive symptoms from a 10 to a 3  Met:  No  Target date: 3-5 days  As evidenced by: Pt has flat affect and depressed mood.    3.  Goal (s): Reduce anxiety symptoms from a 10 to a 3  Met:  No  Target date:  3-5 days  As evidenced by: Pt endorses anxiety today.    Attendees: Patient:     Family:     Physician: Geoffery Lyons, MD 11/13/2012 11:09 AM   Nursing: Carroll Sage, RN 11/13/2012 11:09 AM   Clinical Social Worker:  Joe Lawrence, LCSWA 11/13/2012  11:09 AM   Other:  11/13/2012  11:09 AM     Other:     Other:     Other:     Other:      Scribe for Treatment Team:   Joe Lawrence 11/13/2012 11:09 AM
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
## 2012-11-13 NOTE — Progress Notes (Signed)
[facility] Group Notes:  (Counselor/Nursing/MHT/Case Management/Adjunct)  11/13/2012 12:20 PM  Type of Therapy:  Psychoeducational Skills  Participation Level:  Minimal  Participation Quality:  Appropriate, Drowsy and Supportive  Affect:  Appropriate and Flat  Cognitive:  Appropriate  Engagement in Group:  Limited  Engagement in Therapy:  Limited  Modes of Intervention:  Activity, Socialization and Support  Summary of Progress/Problems: Pt participation was limited in a game called "Buzzword" for therapeutic activity. Pt appeared to try his best to actively participate though pt stated he was not feeling well and eventually excused himself from the group.      Dalia Heading 11/13/2012, 12:20 PM

## 2012-11-13 NOTE — Progress Notes (Signed)
D.  Pt pleasant but anxious on approach.  Pt states he has had a rough day re: anxiety.  Pt also still has complaint of sore throat for which he is getting lozenges.  No acute distress noted, denies SI/HI/hallucinations at present.  Interacting appropriately within milieu.  A.  Support and encouragement offered, medications given as ordered for withdrawal symptoms and sore throat.  R.  No acute distress noted, will continue to monitor.

## 2012-11-13 NOTE — Progress Notes (Signed)
[facility] Adult Inpatient Family/Significant Other Suicide Prevention Education  Suicide Prevention Education:  Education Completed; Ramon Dredge - mother [phone]),  (name of family member/significant other) has been identified by the patient as the family member/significant other with whom the patient will be residing, and identified as the person(s) who will aid the patient in the event of a mental health crisis (suicidal ideations/suicide attempt).  With written consent from the patient, the family member/significant other has been provided the following suicide prevention education, prior to the and/or following the discharge of the patient.  The suicide prevention education provided includes the following:  Suicide risk factors  Suicide prevention and interventions  National Suicide Hotline telephone number  [facility] assessment telephone number  [facility] Emergency Assistance 911  [facility] and/or Residential Mobile Crisis Unit telephone number  Request made of family/significant other to:  Remove weapons (e.g., guns, rifles, knives), all items previously/currently identified as safety concern.    Remove drugs/medications (over-the-counter, prescriptions, illicit drugs), all items previously/currently identified as a safety concern.  The family member/significant other verbalizes understanding of the suicide prevention education information provided.  The family member/significant other agrees to remove the items of safety concern listed above. Ms. Luvenia Starch states that she is concerned if pt doesn't go straight to treatment from here.  Ms. Luvenia Starch states that she believes pt will d/c and use the same day.  Ms. Luvenia Starch states that she and pt's friends are encouraging him to get further help to address his substance use.    Carmina Miller 11/13/2012, 12:24 PM

## 2012-11-13 NOTE — Progress Notes (Signed)
Found pt awake during rounds. He reports restlessness, sweats, shakiness and anxiety. States he cannot sleep. Meds reviewed and librium 25mg  given. Support offered and fluids provided. CIWA and COWS both at a 5. Pt denies SI/HI/AVH at present. Will reassess in 1 hr for effectiveness. Joe Joe

## 2012-11-13 NOTE — Progress Notes (Signed)
Patient ID: Joe Lawrence, male   DOB: November 08, 1971, 41 y.o.   MRN: [id number]  PER STATE REGULATIONS 482.30  THIS CHART WAS REVIEWED FOR MEDICAL NECESSITY WITH RESPECT TO THE PATIENT'S ADMISSION/ DURATION OF STAY.  NEXT REVIEW DATE: 11/15/2012  Willa Rough, RN, BSN CASE MANAGER
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
## 2012-11-13 NOTE — Progress Notes (Signed)
  D) Patient anxious but cooperative upon my assessment. Patient exhibits med seeking behaviors at times. Patient states slept "not very good," and  appetite is "improving." Patient rates depression as   5/10, patient rates hopeless feelings as  5/10. Patient denies SI/HI, denies A/V hallucinations.   A) Patient offered support and encouragement, patient encouraged to discuss feelings/concerns with staff. Patient verbalized understanding. Patient monitored Q15 minutes for safety. Patient met with MD to discuss today's goals and plan of care.  R) Patient attending some groups and most meals in dining room. Patient insightful with a plan to attend "AA meting and fellowship" after discharge.  Patient taking medications as ordered. Will continue to monitor.

## 2012-11-13 NOTE — Progress Notes (Signed)
Psychoeducational Group Note  Date:  11/13/2012 Time:  2000  Group Topic/Focus:  AA group  Participation Level:  Active  Participation Quality:  Appropriate  Affect:  Appropriate  Cognitive:  Alert  Insight:  Good  Engagement in Group:  Good  Additional Comments:    Joe Lawrence R 11/13/2012, 10:08 PM

## 2012-11-14 NOTE — Progress Notes (Signed)
[facility] MD Progress Note  11/14/2012 4:48 PM Joe Lawrence  MRN:  [id number]  Diagnosis:  Opioid dependence   ADL's:  Intact  Sleep: Fair  Appetite:  Fair  Suicidal Ideation:  denies Homicidal Ideation:  denies  AEB (as evidenced by): Subjective: Met with patient 1:1 to discuss his response to care. He notes he is now having sweats and chills, decreased appetite, and muscle aches. Mental Status Examination/Evaluation: Objective:  Appearance: Disheveled  Eye Contact::  Fair  Speech:  Normal Rate  Volume:  Normal  Mood:  Depressed  Affect:  Congruent  Thought Process:  Coherent  Orientation:  Full  Thought Content:  WDL  Suicidal Thoughts:  No  Homicidal Thoughts:  No  Memory:  Immediate;   Fair  Judgement:  Impaired  Insight:  Lacking  Psychomotor Activity:  Normal  Concentration:  Fair  Recall:  Fair  Akathisia:  No  Handed:    AIMS (if indicated):     Assets:  Communication Skills Physical Health  Sleep:  Number of Hours: 6    Vital Signs:Blood pressure 135/91, pulse 86, temperature 98.5 F (36.9 C), temperature source Oral, resp. rate 16, height 5\' 7"  (1.702 m), weight 76.204 kg (168 lb). Current Medications: Current Facility-Administered Medications  Medication Dose Route Frequency Provider Last Rate Last Dose  . chlordiazePOXIDE (LIBRIUM) capsule 25 mg  25 mg Oral Q6H PRN Verne Spurr, PA-C   25 mg at 11/14/12 0311  . [COMPLETED] chlordiazePOXIDE (LIBRIUM) capsule 25 mg  25 mg Oral QID Verne Spurr, PA-C   25 mg at 11/14/12 5284   Followed by  . chlordiazePOXIDE (LIBRIUM) capsule 25 mg  25 mg Oral TID Verne Spurr, PA-C   25 mg at 11/14/12 1207   Followed by  . chlordiazePOXIDE (LIBRIUM) capsule 25 mg  25 mg Oral BH-qamhs Verne Spurr, PA-C       Followed by  . chlordiazePOXIDE (LIBRIUM) capsule 25 mg  25 mg Oral Daily Verne Spurr, PA-C      . citalopram (CELEXA) tablet 40 mg  40 mg Oral Daily Rachael Fee, MD   40 mg at 11/14/12 1324  .  cloNIDine (CATAPRES) tablet 0.1 mg  0.1 mg Oral QID Verne Spurr, PA-C   0.1 mg at 11/14/12 1206   Followed by  . cloNIDine (CATAPRES) tablet 0.1 mg  0.1 mg Oral BH-qamhs Verne Spurr, PA-C       Followed by  . cloNIDine (CATAPRES) tablet 0.1 mg  0.1 mg Oral QAC breakfast Verne Spurr, PA-C      . dicyclomine (BENTYL) tablet 20 mg  20 mg Oral Q6H PRN Verne Spurr, PA-C      . lipase/protease/amylase (CREON-10/PANCREASE) capsule 1 capsule  1 capsule Oral TID WC Verne Spurr, PA-C   1 capsule at 11/14/12 1206  . loperamide (IMODIUM) capsule 2-4 mg  2-4 mg Oral PRN Verne Spurr, PA-C      . methocarbamol (ROBAXIN) tablet 500 mg  500 mg Oral Q8H PRN Verne Spurr, PA-C   500 mg at 11/14/12 1514  . multivitamin with minerals tablet 1 tablet  1 tablet Oral Daily Verne Spurr, PA-C   1 tablet at 11/14/12 4010  . naproxen (NAPROSYN) tablet 500 mg  500 mg Oral BID PRN Verne Spurr, PA-C   500 mg at 11/14/12 1514  . nicotine (NICODERM CQ - dosed in mg/24 hours) patch 21 mg  21 mg Transdermal Daily Rachael Fee, MD   21 mg at 11/13/12 1710  . ondansetron (ZOFRAN-ODT)  disintegrating tablet 4 mg  4 mg Oral Q6H PRN Verne Spurr, PA-C      . pantoprazole (PROTONIX) EC tablet 40 mg  40 mg Oral Daily Verne Spurr, PA-C   40 mg at 11/14/12 1308  . phenol-menthol (CEPASTAT) lozenge 1 lozenge  1 lozenge Buccal PRN Verne Spurr, PA-C   1 lozenge at 11/13/12 1025  . QUEtiapine (SEROQUEL) tablet 150 mg  150 mg Oral QHS Verne Spurr, PA-C   150 mg at 11/13/12 2129  . thiamine (VITAMIN B-1) tablet 100 mg  100 mg Oral Daily Verne Spurr, PA-C   100 mg at 11/14/12 6578    Lab Results:  Results for orders placed during the hospital encounter of 11/12/12 (from the past 48 hour(s))  RAPID STREP SCREEN     Status: Normal   Collection Time   11/13/12  9:06 AM      Component Value Range Comment   Streptococcus, Group A Screen (Direct) NEGATIVE  NEGATIVE     Physical Findings: AIMS: Facial and Oral  Movements Muscles of Facial Expression: None, normal Lips and Perioral Area: None, normal Jaw: None, normal Tongue: None, normal,Extremity Movements Upper (arms, wrists, hands, fingers): None, normal Lower (legs, knees, ankles, toes): None, normal, Trunk Movements Neck, shoulders, hips: None, normal, Overall Severity Severity of abnormal movements (highest score from questions above): None, normal Incapacitation due to abnormal movements: None, normal Patient's awareness of abnormal movements (rate only patient's report): No Awareness, Dental Status Current problems with teeth and/or dentures?: No Does patient usually wear dentures?: No  CIWA:  CIWA-Ar Total: 5  COWS:  COWS Total Score: 6   Treatment Plan Summary: Daily contact with patient to assess and evaluate symptoms and progress in treatment Medication management  Plan: 1. Continue current plan of care. 2. Patient is encouraged to improve his hydration.  Rona Ravens. Phoenyx Paulsen PAC 11/14/2012, 4:48 PM

## 2012-11-14 NOTE — Progress Notes (Signed)
D patient has had a hard detoxing, WD s/s prevalent and treated w/Clonidine and Librium protocols. Having sweats and shakes most of day, took shower this afternoon and this made him feel somewhat better, eating in the DR, not attendeing group today d/t WD s/s. Teared up this afternoon he was feeling so back with achiness and pains thruout his body. A q22min safety checks continue and support offered, encouraged to go to group but is really having a bad day R patient remains safe on the hall

## 2012-11-14 NOTE — Progress Notes (Signed)
Psychoeducational Group Note  Date:  11/14/2012 Time:  0930  Group Topic/Focus:  Recovery Goals:   The focus of this group is to identify appropriate goals for recovery and establish a plan to achieve them.  Participation Level:  Did Not Attend  Participation Quality:  Did not attend   Affect:  Did not attend  Cognitive:  Did not attned  Insight:  Did not attned  Engagement in Group:  Did not attend  Additional Comments:  Did not attend group Roselee Culver 11/14/2012, 11:43 AM

## 2012-11-14 NOTE — Clinical Social Work Note (Signed)
[facility] Group Notes: (Clinical Social Work)   11/14/2012   10:00-11:00AM   Type of Therapy:  Group Therapy   Participation Level:  Did Not Attend    Ambrose Mantle, LCSW 11/14/2012, 12:20 PM

## 2012-11-14 NOTE — Progress Notes (Signed)
Psychoeducational Group Note  Date:  11/14/2012 Time:  1315  Group Topic/Focus:  Building Self Esteem:   The Focus of this group is helping patients become aware of the effects of self-esteem on their lives, the things they and others do that enhance or undermine their self-esteem, seeing the relationship between their level of self-esteem and the choices they make and learning ways to enhance self-esteem.  Participation Level:  Did Not Attend  Participation Quality:  Did not attend  Affect:  Depressed  Cognitive:  Appropriate  Insight:  Did not attend  Engagement in Group:  Did not attend  Additional Comments:  Patient did not attend group  Sandria Senter 11/14/2012, 2:19 PM

## 2012-11-14 NOTE — Progress Notes (Signed)
Patient did attend the evening speaker AA meeting.  

## 2012-11-14 NOTE — Progress Notes (Signed)
Psychoeducational Group Note  Date:  11/14/2012 Time:  1515  Group Topic/Focus:  Healthy Communication:   The focus of this group is to discuss communication, barriers to communication, as well as healthy ways to communicate with others.  Participation Level:  Did Not Attend  Participation Quality:  Appropriate  Affect:  Appropriate  Cognitive:  Appropriate  Insight:  None  Engagement in Group:  None  Additional Comments:  Patient was invited to attend group, but remained in bed awake.  Lyndee Hensen 11/14/2012, 9:32 PM

## 2012-11-15 MED ORDER — NICOTINE POLACRILEX 2 MG MT GUM
2.0000 mg | CHEWING_GUM | OROMUCOSAL | Status: DC | PRN
  Administered 2012-11-16 – 2012-11-18 (×10): 2 mg via ORAL

## 2012-11-15 NOTE — Progress Notes (Signed)
Psychoeducational Group Note  Date:  11/15/2012 Time:  1000am  Group Topic/Focus:  Making Healthy Choices:   The focus of this group is to help patients identify negative/unhealthy choices they were using prior to admission and identify positive/healthier coping strategies to replace them upon discharge.  Participation Level:  Active  Participation Quality:  Appropriate  Affect:  Appropriate  Cognitive:  Appropriate  Insight:  Good  Engagement in Group:  Good  Additional Comments:    Joe Lawrence 11/15/2012,2:21 PM

## 2012-11-15 NOTE — Progress Notes (Signed)
Patient ID: Joe Lawrence, male   DOB: June 06, 1971, 41 y.o.   MRN: [id number] D)  [name] has been somewhat anxious this evening, and c/o withdrawal sx .  Stated his stomach was a little queazy, felt anxious, achey .  Was medicated and he went back to his room, back out again later to get robaxin and zofran.  Was given several glasses of gatorade, fluids were encouraged.  A)  Will continue to monitor for safety.

## 2012-11-15 NOTE — Clinical Social Work Note (Signed)
[facility] Group Notes: (Clinical Social Work)   11/15/2012  10-11AM   Type of Therapy:  Group Therapy   Participation Level:  Did Not Attend    Ambrose Mantle, LCSW 11/15/2012, 11:17 AM

## 2012-11-15 NOTE — Progress Notes (Signed)
Patient ID: Joe Lawrence, male   DOB: [birth year], 41 y.o.   MRN: [id number] 11-15-12 nursing shift note: D: pt is having a very difficult detox. He is having gen body ache, muscle spasms and stomach cramps. A: each symptoms was addressed with an appropriate prn. Advise that pt to stay in bed because he was also feeling n/v. R: he agreed to stay in bed and is resting. rn will monitor and q 15 min cks continue.

## 2012-11-15 NOTE — Progress Notes (Signed)
Patient ID: Joe Lawrence, male   DOB: 02-01-1971, 41 y.o.   MRN: [id number] D)  Came to med window right after shift change to ask for the q 6 hr librium, was somewhat upset that it had run it's course and dropped off the protocol.  Requested and was given the hs dose a little early as he said he was having anxiety, sweating, nausea.  Was also given zofran  And robaxin for general muscle soreness.  Is sad, disheveled, but very polite in spite of anxiety.  Returned after group for additional meds.  Interacting well with staff and peers.A)  Will continue to monitor for safety.

## 2012-11-15 NOTE — Progress Notes (Signed)
Psychoeducational Group Note  Date:  11/15/2012 Time: 1515 Group Topic/Focus:  Making Healthy Choices:   The focus of this group is to help patients identify negative/unhealthy choices they were using prior to admission and identify positive/healthier coping strategies to replace them upon discharge.  Participation Level:  Active  Participation Quality:  Appropriate  Affect:  Appropriate  Cognitive:  Appropriate  Insight:  Good  Engagement in Group:  Good  Additional Comments:    Ka Flammer H 11/15/2012, 7:24 PM 

## 2012-11-15 NOTE — Progress Notes (Signed)
[facility] MD Progress Note  11/15/2012 11:55 AM Joe Lawrence  MRN:  [id number]  Diagnosis:  Opioid dependence   ADL's:  Intact  Sleep: poor  Appetite:  Fair  Suicidal Ideation:  denies Homicidal Ideation:  denies  AEB (as evidenced by): Subjective: Met with patient 1:1 to discuss his response to care. Joe Lawrence is reporting increasing depression and guilt due to his relapse, pending criminal charges as he has a DUI, and robbery charges from robbing a pharmacy. He is tearful and sad today.  He was in group room earlier, but has retreated to his room.   Mental Status Examination/Evaluation: Objective:  Appearance: Disheveled  Eye Contact::  Fair  Speech:  Normal Rate  Volume:  Normal  Mood:  Depressed  Affect:  Tearful and sad  Thought Process:  Coherent  Orientation:  Full  Thought Content:  WDL  Suicidal Thoughts:  No  Homicidal Thoughts:  No  Memory:  Immediate;   Fair  Judgement:  Impaired  Insight:  Lacking  Psychomotor Activity:  Normal  Concentration:  Fair  Recall:  Fair  Akathisia:  No  Handed:  right  AIMS (if indicated):     Assets:  Communication Skills Physical Health  Sleep:  Number of Hours: 1    Vital Signs:Blood pressure 133/82, pulse 71, temperature 98.5 F (36.9 C), temperature source Oral, resp. rate 18, height 5\' 7"  (1.702 m), weight 76.204 kg (168 lb). Current Medications: Current Facility-Administered Medications  Medication Dose Route Frequency Provider Last Rate Last Dose  . chlordiazePOXIDE (LIBRIUM) capsule 25 mg  25 mg Oral Q6H PRN Verne Spurr, PA-C   25 mg at 11/15/12 1135  . [COMPLETED] chlordiazePOXIDE (LIBRIUM) capsule 25 mg  25 mg Oral TID Verne Spurr, PA-C   25 mg at 11/15/12 0749   Followed by  . chlordiazePOXIDE (LIBRIUM) capsule 25 mg  25 mg Oral BH-qamhs Verne Spurr, PA-C       Followed by  . chlordiazePOXIDE (LIBRIUM) capsule 25 mg  25 mg Oral Daily Verne Spurr, PA-C      . citalopram (CELEXA) tablet 40 mg  40 mg  Oral Daily Rachael Fee, MD   40 mg at 11/15/12 0752  . [COMPLETED] cloNIDine (CATAPRES) tablet 0.1 mg  0.1 mg Oral QID Verne Spurr, PA-C   0.1 mg at 11/14/12 2144   Followed by  . cloNIDine (CATAPRES) tablet 0.1 mg  0.1 mg Oral BH-qamhs Verne Spurr, PA-C   0.1 mg at 11/15/12 0750   Followed by  . cloNIDine (CATAPRES) tablet 0.1 mg  0.1 mg Oral QAC breakfast Verne Spurr, PA-C      . dicyclomine (BENTYL) tablet 20 mg  20 mg Oral Q6H PRN Verne Spurr, PA-C   20 mg at 11/15/12 1135  . lipase/protease/amylase (CREON-10/PANCREASE) capsule 1 capsule  1 capsule Oral TID WC Verne Spurr, PA-C   1 capsule at 11/15/12 1135  . loperamide (IMODIUM) capsule 2-4 mg  2-4 mg Oral PRN Verne Spurr, PA-C      . methocarbamol (ROBAXIN) tablet 500 mg  500 mg Oral Q8H PRN Verne Spurr, PA-C   500 mg at 11/15/12 1135  . multivitamin with minerals tablet 1 tablet  1 tablet Oral Daily Verne Spurr, PA-C   1 tablet at 11/15/12 0749  . naproxen (NAPROSYN) tablet 500 mg  500 mg Oral BID PRN Verne Spurr, PA-C   500 mg at 11/15/12 1135  . nicotine (NICODERM CQ - dosed in mg/24 hours) patch 21 mg  21 mg Transdermal  Daily Rachael Fee, MD   21 mg at 11/15/12 6213  . ondansetron (ZOFRAN-ODT) disintegrating tablet 4 mg  4 mg Oral Q6H PRN Verne Spurr, PA-C   4 mg at 11/14/12 2355  . pantoprazole (PROTONIX) EC tablet 40 mg  40 mg Oral Daily Verne Spurr, PA-C   40 mg at 11/15/12 0750  . phenol-menthol (CEPASTAT) lozenge 1 lozenge  1 lozenge Buccal PRN Verne Spurr, PA-C   1 lozenge at 11/13/12 1025  . QUEtiapine (SEROQUEL) tablet 150 mg  150 mg Oral QHS Verne Spurr, PA-C   150 mg at 11/14/12 2144  . thiamine (VITAMIN B-1) tablet 100 mg  100 mg Oral Daily Verne Spurr, PA-C   100 mg at 11/15/12 0865    Lab Results:  Reviewed rapid strep screen and this is negative.   Physical Findings: AIMS: Facial and Oral Movements Muscles of Facial Expression: None, normal Lips and Perioral Area: None, normal Jaw:  None, normal Tongue: None, normal,Extremity Movements Upper (arms, wrists, hands, fingers): None, normal Lower (legs, knees, ankles, toes): None, normal, Trunk Movements Neck, shoulders, hips: None, normal, Overall Severity Severity of abnormal movements (highest score from questions above): None, normal Incapacitation due to abnormal movements: None, normal Patient's awareness of abnormal movements (rate only patient's report): No Awareness, Dental Status Current problems with teeth and/or dentures?: No Does patient usually wear dentures?: No  CIWA:  CIWA-Ar Total: 7  COWS:  COWS Total Score: 6   Treatment Plan Summary: Daily contact with patient to assess and evaluate symptoms and progress in treatment Medication management  Plan: 1. Continue current plan of care. 2. Patient is encouraged to improve his hydration. 3. Offered moral support and reassurance. Joe Lawrence. Joe Lawrence PAC 11/15/2012, 11:55 AM

## 2012-11-15 NOTE — Progress Notes (Signed)
Patient did attend the evening speaker AA meeting.  

## 2012-11-16 MED ORDER — LORAZEPAM 1 MG PO TABS
1.0000 mg | ORAL_TABLET | Freq: Four times a day (QID) | ORAL | Status: DC | PRN
  Administered 2012-11-16 – 2012-11-23 (×22): 1 mg via ORAL
  Filled 2012-11-16 (×22): qty 1

## 2012-11-16 NOTE — Clinical Social Work Note (Signed)
Aftercare Planning Group: 11/16/2012 9:45 AM  Pt did not attend d/c planning group on this date.  CSW met with pt individually at this time.  Pt is interested in long term treatment now.  CSW secured pt a bed at [facility] on 12/2.  No further needs voiced by pt at this time.    [facility] Group Note : Clinical Social Worker Group Therapy  11/16/2012  1:15 PM  Type of Therapy:  Group Therapy - Process Group  Participation Level:  Did Not Attend group on overcoming obstacles  Darrel Baroni Horton, LCSWA 11/16/2012 3:00 pm

## 2012-11-16 NOTE — Progress Notes (Signed)
D patient did not fill out self inventory sheet, had a hard rough day yesterday and again today w/WD s/s cravings, sweating and anxiety, seen by MD and Ativan prescribed for his opiate use, this seemed to help him out, eating meals in the DR, taking meds as ordered by MD, did not attend group d/t feel too rough to sit thru the group, is working on doing better w/attending group, does interact w/peers from time to time A q4min safety checks continue and support offered, continue to urge/encourage to go to group R patient remains safe on the unit

## 2012-11-16 NOTE — Progress Notes (Signed)
[facility] MD Progress Note  11/16/2012 5:13 PM Joe Lawrence  MRN:  [id number]  Diagnosis:  Opioid dependence, Alcohol Dependence  ADL's:  Intact  Sleep: Poor  Appetite:  Poor  Suicidal Ideation:  Plan:  Denies Intent:  Denies Means:  Denies Homicidal Ideation:  Plan:  Denies Intent:  Denies Means:  Denies  Having Lawrence very difficult withdrawal, He is also having pain from his pancreatitis. Endorses that he cant do food that is fried.Will stick to Ensure. He feels overwhelmed, very regretful. Cant still believe what he did.   Mental Status Examination/Evaluation: Objective:  Appearance: Fairly Groomed  Patent attorney::  Fair  Speech:  Clear and Coherent and Slow  Volume:  Decreased  Mood:  Depressed  Affect:  Depressed and Tearful  Thought Process:  Coherent and Goal Directed  Orientation:  Full  Thought Content:  hopelessness, shame, guilt  Suicidal Thoughts:  No  Homicidal Thoughts:  No  Memory:  Immediate;   Fair Recent;   Fair Remote;   Fair  Judgement:  Fair  Insight:  Present  Psychomotor Activity:  Normal  Concentration:  Fair  Recall:  Fair  Akathisia:  No  Handed:  Right  AIMS (if indicated):     Assets:  Desire for Improvement  Sleep:  Number of Hours: 2.25    Vital Signs:Blood pressure 115/77, pulse 72, temperature 98.6 F (37 C), temperature source Oral, resp. rate 16, height 5\' 7"  (1.702 m), weight 76.204 kg (168 lb). Current Medications: Current Facility-Administered Medications  Medication Dose Route Frequency Provider Last Rate Last Dose  . [COMPLETED] chlordiazePOXIDE (LIBRIUM) capsule 25 mg  25 mg Oral BH-qamhs Verne Spurr, PA-C   25 mg at 11/16/12 5784   Followed by  . chlordiazePOXIDE (LIBRIUM) capsule 25 mg  25 mg Oral Daily Verne Spurr, PA-C      . citalopram (CELEXA) tablet 40 mg  40 mg Oral Daily Rachael Fee, MD   40 mg at 11/16/12 0756  . cloNIDine (CATAPRES) tablet 0.1 mg  0.1 mg Oral BH-qamhs Verne Spurr, PA-C   0.1 mg at 11/16/12  6962   Followed by  . cloNIDine (CATAPRES) tablet 0.1 mg  0.1 mg Oral QAC breakfast Verne Spurr, PA-C      . dicyclomine (BENTYL) tablet 20 mg  20 mg Oral Q6H PRN Verne Spurr, PA-C   20 mg at 11/15/12 2113  . lipase/protease/amylase (CREON-10/PANCREASE) capsule 1 capsule  1 capsule Oral TID WC Verne Spurr, PA-C   1 capsule at 11/16/12 1700  . loperamide (IMODIUM) capsule 2-4 mg  2-4 mg Oral PRN Verne Spurr, PA-C      . LORazepam (ATIVAN) tablet 1 mg  1 mg Oral Q6H PRN Rachael Fee, MD   1 mg at 11/16/12 1407  . methocarbamol (ROBAXIN) tablet 500 mg  500 mg Oral Q8H PRN Verne Spurr, PA-C   500 mg at 11/16/12 1117  . multivitamin with minerals tablet 1 tablet  1 tablet Oral Daily Verne Spurr, PA-C   1 tablet at 11/15/12 0749  . naproxen (NAPROSYN) tablet 500 mg  500 mg Oral BID PRN Verne Spurr, PA-C   500 mg at 11/15/12 1135  . nicotine polacrilex (NICORETTE) gum 2 mg  2 mg Oral Q2H PRN Nehemiah Settle, MD   2 mg at 11/16/12 1407  . ondansetron (ZOFRAN-ODT) disintegrating tablet 4 mg  4 mg Oral Q6H PRN Verne Spurr, PA-C   4 mg at 11/15/12 1940  . pantoprazole (PROTONIX) EC tablet 40 mg  40 mg Oral Daily Verne Spurr, PA-C   40 mg at 11/16/12 0756  . phenol-menthol (CEPASTAT) lozenge 1 lozenge  1 lozenge Buccal PRN Verne Spurr, PA-C   1 lozenge at 11/13/12 1025  . QUEtiapine (SEROQUEL) tablet 150 mg  150 mg Oral QHS Verne Spurr, PA-C   150 mg at 11/15/12 2113  . thiamine (VITAMIN B-1) tablet 100 mg  100 mg Oral Daily Verne Spurr, PA-C   100 mg at 11/16/12 0756  . [DISCONTINUED] nicotine (NICODERM CQ - dosed in mg/24 hours) patch 21 mg  21 mg Transdermal Daily Rachael Fee, MD   21 mg at 11/15/12 1610    Lab Results: No results found for this or any previous visit (from the past 48 hour(s)).  Physical Findings: AIMS: Facial and Oral Movements Muscles of Facial Expression: None, normal Lips and Perioral Area: None, normal Jaw: None, normal Tongue: None,  normal,Extremity Movements Upper (arms, wrists, hands, fingers): None, normal Lower (legs, knees, ankles, toes): None, normal, Trunk Movements Neck, shoulders, hips: None, normal, Overall Severity Severity of abnormal movements (highest score from questions above): None, normal Incapacitation due to abnormal movements: None, normal Patient's awareness of abnormal movements (rate only patient's report): No Awareness, Dental Status Current problems with teeth and/or dentures?: No Does patient usually wear dentures?: No  CIWA:  CIWA-Ar Total: 7  COWS:  COWS Total Score: 9   Treatment Plan Summary: Daily contact with patient to assess and evaluate symptoms and progress in treatment Medication management  Plan:Supportive approach/coping skills/relapse prevention          Ativan mg q 6 hrs as needed  Joe Lawrence 11/16/2012, 5:13 PM

## 2012-11-16 NOTE — Progress Notes (Signed)
Patient ID: Joe Lawrence, male   DOB: 11-Sep-1971, 41 y.o.   MRN: [id number]  PER STATE REGULATIONS 482.30  THIS CHART WAS REVIEWED FOR MEDICAL NECESSITY WITH RESPECT TO THE PATIENT'S ADMISSION/ DURATION OF STAY.  NEXT REVIEW DATE: 11/19/2012  Willa Rough, RN, BSN CASE MANAGER

## 2012-11-16 NOTE — Tx Team (Signed)
Interdisciplinary Treatment Plan Update (Adult)  Date:  11/16/2012  Time Reviewed:  9:54 AM   Progress in Treatment: Attending groups: Yes Participating in groups:  Yes Taking medication as prescribed: Yes Tolerating medication:  Yes Family/Significant othe contact made:  Yes Patient understands diagnosis:  Yes Discussing patient identified problems/goals with staff:  Yes Medical problems stabilized or resolved:  Yes Denies suicidal/homicidal ideation: Yes Issues/concerns per patient self-inventory:  None identified Other: N/A  New problem(s) identified: None Identified  Reason for Continuation of Hospitalization: Anxiety Depression Medication stabilization Withdrawal symptoms  Interventions implemented related to continuation of hospitalization: mood stabilization, medication monitoring and adjustment, group therapy and psycho education, suicide risk assessment, collateral contact, aftercare planning, ongoing physician assessments and safety checks q 15 mins  Additional comments: N/A  Estimated length of stay: 3-5 days  Discharge Plan: CSW is assessing for appropriate referrals.    New goal(s): N/A  Review of initial/current patient goals per problem list:    1.  Goal(s): Address substance use by completing detox protocol  Met:  No  Target date: 11/20  As evidenced by: detox complete  2.  Goal (s): Reduce depressive symptoms from a 10 to a 3  Met:  No  Target date: 3-5 days  As evidenced by: Pt rates at a  today.    3.  Goal (s): Reduce anxiety symptoms from a 10 to a 3  Met:  No  Target date:  3-5 days  As evidenced by: Pt rates at a today.     Attendees: Patient:     Family:     Physician: Geoffery Lyons, MD 11/16/2012 9:54 AM   Nursing: Roswell Miners, RN 11/16/2012 9:54 AM   Clinical Social Worker:  Reyes Ivan, LCSWA 11/16/2012  9:54 AM   Other: Chinita Greenland, RN 11/16/2012  9:54 AM   Other:  Alease Frame, RN 11/16/2012 9:55 AM   Other:  Bubba Camp, Psyc intern 11/16/2012 9:55 AM   Other:     Other:      Scribe for Treatment Team:   Reyes Ivan 11/16/2012 9:54 AM

## 2012-11-16 NOTE — Progress Notes (Signed)
Did not attend 11 am psycho educational group  

## 2012-11-16 NOTE — Progress Notes (Signed)
D: Patient in dayroom on approach.  Patient and starts out his conversation about what prn medications he can have.  Patient states he is detoxing from opiates.  Patient states he was sober for 3 years and then he relapsed after he had to start taking medications for pancreatitis.  Patient states he has had body aches today and it takes nim about 10 days to detox.  Patient rates depression 8/10 and anxiety 8/10.  Patient denies SI/HI and denies AVH. A: Staff to monitor Q 15 mins for safety.  Encouragement and support offered.  Scheduled medications administered per orders.  Robaxin prn given for muscle spasms, nicorette administered pr for nicotine cravings, ativan prn administered for anxiety and bentyl administered prn for abdominal cramping.   R: Patient remains safe on the unit.  Patient cooperative on the unit.  Patient attended aa/na group tonight.  Patient taking administered medications.

## 2012-11-17 MED ORDER — LOPERAMIDE HCL 2 MG PO CAPS
2.0000 mg | ORAL_CAPSULE | ORAL | Status: DC | PRN
  Administered 2012-11-17: 2 mg via ORAL

## 2012-11-17 NOTE — Progress Notes (Signed)
[facility] MD Progress Note  11/17/2012 7:23 PM Nihar Klus  MRN:  [id number]  Diagnosis:  Opioids Dependence, Alcohol Dependence  ADL's:  Intact  Sleep: Fair  Appetite:  Poor  Suicidal Ideation:  Plan:  Denies Intent:  Denies Means:  Denies Homicidal Ideation:  Plan:  Denies Intent:  Denies Means:  Denies  Continues to have a hard time. He is dealing with the withdrawal plus the pain coming from the pancreatitis. He is still dealing with a lot of shame, guilt  and regrets for allowing himself to lose all that he was able to accomplish. (tearful when he talks about this)  Mental Status Examination/Evaluation: Objective:  Appearance: Fairly Groomed  Patent attorney::  Fair  Speech:  Clear and Coherent and Normal Rate  Volume:  Decreased  Mood:  Anxious and Depressed, Worried  Affect:  in pain  Thought Process:  Coherent and Goal Directed  Orientation:  Full  Thought Content:  WDL  Suicidal Thoughts:  No  Homicidal Thoughts:  No  Memory:  Immediate;   Fair Recent;   Fair Remote;   Fair  Judgement:  Fair  Insight:  Present  Psychomotor Activity:  Normal  Concentration:  Fair  Recall:  Fair  Akathisia:  No  Handed:  Right  AIMS (if indicated):     Assets:  Desire for Improvement Housing Social Support  Sleep:  Number of Hours: 4.75    Vital Signs:Blood pressure 126/85, pulse 68, temperature 97.6 F (36.4 C), temperature source Oral, resp. rate 16, height 5\' 7"  (1.702 m), weight 76.204 kg (168 lb). Current Medications: Current Facility-Administered Medications  Medication Dose Route Frequency Provider Last Rate Last Dose  . [COMPLETED] chlordiazePOXIDE (LIBRIUM) capsule 25 mg  25 mg Oral Daily Verne Spurr, PA-C   25 mg at 11/17/12 4098  . citalopram (CELEXA) tablet 40 mg  40 mg Oral Daily Rachael Fee, MD   40 mg at 11/17/12 0834  . [COMPLETED] cloNIDine (CATAPRES) tablet 0.1 mg  0.1 mg Oral BH-qamhs Verne Spurr, PA-C   0.1 mg at 11/16/12 2115   Followed by  .  cloNIDine (CATAPRES) tablet 0.1 mg  0.1 mg Oral QAC breakfast Verne Spurr, PA-C   0.1 mg at 11/17/12 0834  . [EXPIRED] dicyclomine (BENTYL) tablet 20 mg  20 mg Oral Q6H PRN Verne Spurr, PA-C   20 mg at 11/16/12 2125  . lipase/protease/amylase (CREON-10/PANCREASE) capsule 1 capsule  1 capsule Oral TID WC Verne Spurr, PA-C   1 capsule at 11/17/12 1723  . loperamide (IMODIUM) capsule 2 mg  2 mg Oral PRN Verne Spurr, PA-C   2 mg at 11/17/12 1814  . [EXPIRED] loperamide (IMODIUM) capsule 2-4 mg  2-4 mg Oral PRN Verne Spurr, PA-C      . LORazepam (ATIVAN) tablet 1 mg  1 mg Oral Q6H PRN Rachael Fee, MD   1 mg at 11/17/12 1429  . [EXPIRED] methocarbamol (ROBAXIN) tablet 500 mg  500 mg Oral Q8H PRN Verne Spurr, PA-C   500 mg at 11/17/12 0837  . multivitamin with minerals tablet 1 tablet  1 tablet Oral Daily Verne Spurr, PA-C   1 tablet at 11/15/12 0749  . [EXPIRED] naproxen (NAPROSYN) tablet 500 mg  500 mg Oral BID PRN Verne Spurr, PA-C   500 mg at 11/15/12 1135  . nicotine polacrilex (NICORETTE) gum 2 mg  2 mg Oral Q2H PRN Nehemiah Settle, MD   2 mg at 11/17/12 1449  . [EXPIRED] ondansetron (ZOFRAN-ODT) disintegrating tablet 4  mg  4 mg Oral Q6H PRN Verne Spurr, PA-C   4 mg at 11/15/12 1940  . pantoprazole (PROTONIX) EC tablet 40 mg  40 mg Oral Daily Verne Spurr, PA-C   40 mg at 11/17/12 0834  . phenol-menthol (CEPASTAT) lozenge 1 lozenge  1 lozenge Buccal PRN Verne Spurr, PA-C   1 lozenge at 11/13/12 1025  . QUEtiapine (SEROQUEL) tablet 150 mg  150 mg Oral QHS Verne Spurr, PA-C   150 mg at 11/16/12 2115  . thiamine (VITAMIN B-1) tablet 100 mg  100 mg Oral Daily Verne Spurr, PA-C   100 mg at 11/17/12 4098    Lab Results: No results found for this or any previous visit (from the past 48 hour(s)).  Physical Findings: AIMS: Facial and Oral Movements Muscles of Facial Expression: None, normal Lips and Perioral Area: None, normal Jaw: None, normal Tongue: None,  normal,Extremity Movements Upper (arms, wrists, hands, fingers): None, normal Lower (legs, knees, ankles, toes): None, normal, Trunk Movements Neck, shoulders, hips: None, normal, Overall Severity Severity of abnormal movements (highest score from questions above): None, normal Incapacitation due to abnormal movements: None, normal Patient's awareness of abnormal movements (rate only patient's report): No Awareness, Dental Status Current problems with teeth and/or dentures?: No Does patient usually wear dentures?: No  CIWA:  CIWA-Ar Total: 7  COWS:  COWS Total Score: 9   Treatment Plan Summary: Daily contact with patient to assess and evaluate symptoms and progress in treatment Medication management  Plan: Supportive approach/coping skills/relapse prevention           Pursue the Detox further Jess Sulak A 11/17/2012, 7:23 PM

## 2012-11-17 NOTE — Progress Notes (Signed)
Psychoeducational Group Note  Date:  11/17/2012 Time:  2000  Group Topic/Focus:  Wrap-Up Group:   The focus of this group is to help patients review their daily goal of treatment and discuss progress on daily workbooks.  Participation Level:  Active  Participation Quality:  Redirectable, Sharing and Supportive  Affect:  Appropriate  Cognitive:  Appropriate  Insight:  Good  Engagement in Group:  Good  Additional Comments:  Patient stated that he has been trying to stay out of bed today for at times he hurts all over and gets tired easily. Patient expressed his concern for wanting to get his car out of the pound and even stated that he is willing to give someone an extra $500 to help him get it out. Patients sponsor is assisting him with this matter. Patient listed his mother and sponsor as his support persons.   Lyndee Hensen 11/17/2012, 10:25 PM

## 2012-11-17 NOTE — Progress Notes (Signed)
Psychoeducational Group Note  Date:  11/17/2012 Time:  1100  Group Topic/Focus:  Recovery Goals:   The focus of this group is to identify appropriate goals for recovery and establish a plan to achieve them.  Participation Level:  Active  Participation Quality:  Appropriate, Attentive, Sharing and Supportive  Affect:  Appropriate  Cognitive:  Appropriate  Insight:  Good  Engagement in Group:  Good  Additional Comments:  Pt was engaged in group stating he recognizes that in the past he has made his goals to big and unrealistic, setting himself up for failure. Pt stated that this is something he needs to change in order to succeed in the process of recovery.   Dalia Heading 11/17/2012, 4:22 PM

## 2012-11-17 NOTE — Progress Notes (Signed)
[facility] Group Notes:  (Counselor/Nursing/MHT/Case Management/Adjunct)  11/17/2012 12:58 PM  Type of Therapy:  Psychoeducational Skills  Participation Level:  Active  Participation Quality:  Appropriate, Attentive, Sharing and Supportive  Affect:  Appropriate  Cognitive:  Alert, Appropriate and Oriented  Insight:  Good  Engagement in Group:  Good  Engagement in Therapy:  n/a  Modes of Intervention:  Activity, Education, Problem-solving, Socialization and Support  Summary of Progress/Problems: Joe Lawrence came in late but did attend a psycho-education group that focused on using quality time with support systems/individuals to engage in health coping skills. Joe Lawrence participated in activity guessing about self and peers. Joe Lawrence was active while group discussed who their supports are, how they can spend positive quality time with them as a coping skills and a way to strengthen their relationship. Joe Lawrence shared that he had three years of sobriety before his relapse, during that time he would have "fellowship" with people from his AA group where he invited them to his home after meetings for game nights. Joe Lawrence discussed how this was a way for him to re-learn how to have fun and socialize without alcohol. Joe Lawrence was given a homework assignment to find two ways to improve his support systems and twenty activities he can do to spend quality time with his supports.    Joe Lawrence 11/17/2012, 12:58 PM

## 2012-11-17 NOTE — Progress Notes (Signed)
D: Patient denies SI/HI and A/V hallucinations; declined self inventory; rates depression 8/10 and hopelessness 8/10; and anxiety 10/10; reports muscle spasms and cigarette cravings  A: Monitored q 15 minutes; patient encouraged to attend groups; patient educated about medications; patient given medications per physician orders; patient encouraged to express feelings and/or concerns; prn medications  R: Patient is cooperative but pleasant and needy; patient's interaction with staff and peers is appropriate;  patient is taking medications as prescribed and tolerating medications; patient is attending all groups

## 2012-11-17 NOTE — Clinical Social Work Note (Signed)
Aftercare Planning Group: 11/17/2012 9:45 AM  Pt attended discharge planning group and actively participated in group.  CSW provided pt with today's workbook.  Pt presents with flat affect and depressed mood.  Pt rates depression and anxiety at an 8 today.  Pt reports feeling a little better today.  Pt states that he doesn't want to go to inpatient rehab but would be open to going to [facility].  CSW made the referral to [facility] to be assessed.  No further needs voiced by pt at this time.  Safety planning and suicide prevention discussed.  Pt participated in discussion and acknowledged an understanding of the information provided.        [facility] Group Note : Clinical Social Worker Group Therapy  11/17/2012  1:15 PM  Type of Therapy:  Group Therapy - Process Group  Participation Level:  Did Not Attend group with [employer]   Budd Freiermuth Horton, LCSWA 11/17/2012 3:00 pm

## 2012-11-18 MED ORDER — MIRTAZAPINE 30 MG PO TABS
30.0000 mg | ORAL_TABLET | Freq: Every day | ORAL | Status: DC
  Administered 2012-11-18 – 2012-11-19 (×2): 30 mg via ORAL
  Filled 2012-11-18: qty 7
  Filled 2012-11-18 (×6): qty 1

## 2012-11-18 MED ORDER — METHOCARBAMOL 500 MG PO TABS
500.0000 mg | ORAL_TABLET | Freq: Three times a day (TID) | ORAL | Status: DC | PRN
  Administered 2012-11-18 – 2012-11-22 (×10): 500 mg via ORAL
  Filled 2012-11-18 (×10): qty 1

## 2012-11-18 NOTE — Progress Notes (Signed)
[facility] MD Progress Note  11/18/2012 4:32 PM Joe Lawrence  MRN:  [id number]  Diagnosis:  Opioid dependence, withdrawal, Alcohol dependence, Major Depression  ADL's:  Intact  Sleep: Poor  Appetite:  Fair  Suicidal Ideation:  Plan:  Deneis Intent:  Denies Means:  Deneis Homicidal Ideation:  Plan:  Deneis Intent:  Deneis Means:  Deneis  Continues to have a hard time with the withdrawal as well as the abdominal pain secondary to the pancreatitis. Still ruminating about his losses  Mental Status Examination/Evaluation: Objective:  Appearance: Fairly Groomed  Patent attorney::  Fair  Speech:  Clear and Coherent and Normal Rate  Volume:  Normal  Mood:  Anxious and Depressed  Affect:  anxious, sad  Thought Process:  Coherent and Goal Directed  Orientation:  Full  Thought Content:  Rumination  Suicidal Thoughts:  No  Homicidal Thoughts:  No  Memory:  Immediate;   Fair Recent;   Fair Remote;   Fair  Judgement:  Intact  Insight:  Present  Psychomotor Activity:  Normal  Concentration:  Fair  Recall:  Fair  Akathisia:  No  Handed:  Right  AIMS (if indicated):     Assets:  Communication Skills Desire for Improvement Social Support  Sleep:  Number of Hours: 5.75    Vital Signs:Blood pressure 139/87, pulse 69, temperature 97.5 F (36.4 C), temperature source Oral, resp. rate 16, height 5\' 7"  (1.702 m), weight 76.204 kg (168 lb). Current Medications: Current Facility-Administered Medications  Medication Dose Route Frequency Provider Last Rate Last Dose  . citalopram (CELEXA) tablet 40 mg  40 mg Oral Daily Rachael Fee, MD   40 mg at 11/18/12 0825  . [COMPLETED] cloNIDine (CATAPRES) tablet 0.1 mg  0.1 mg Oral QAC breakfast Verne Spurr, PA-C   0.1 mg at 11/18/12 0630  . lipase/protease/amylase (CREON-10/PANCREASE) capsule 1 capsule  1 capsule Oral TID WC Verne Spurr, PA-C   1 capsule at 11/18/12 1127  . loperamide (IMODIUM) capsule 2 mg  2 mg Oral PRN Verne Spurr, PA-C    2 mg at 11/17/12 1814  . LORazepam (ATIVAN) tablet 1 mg  1 mg Oral Q6H PRN Rachael Fee, MD   1 mg at 11/18/12 1328  . methocarbamol (ROBAXIN) tablet 500 mg  500 mg Oral Q8H PRN Rachael Fee, MD   500 mg at 11/18/12 1328  . mirtazapine (REMERON) tablet 30 mg  30 mg Oral QHS Rachael Fee, MD      . multivitamin with minerals tablet 1 tablet  1 tablet Oral Daily Verne Spurr, PA-C   1 tablet at 11/18/12 0825  . nicotine polacrilex (NICORETTE) gum 2 mg  2 mg Oral Q2H PRN Nehemiah Settle, MD   2 mg at 11/18/12 1521  . pantoprazole (PROTONIX) EC tablet 40 mg  40 mg Oral Daily Verne Spurr, PA-C   40 mg at 11/18/12 0825  . phenol-menthol (CEPASTAT) lozenge 1 lozenge  1 lozenge Buccal PRN Verne Spurr, PA-C   1 lozenge at 11/13/12 1025  . QUEtiapine (SEROQUEL) tablet 150 mg  150 mg Oral QHS Verne Spurr, PA-C   150 mg at 11/17/12 2202  . thiamine (VITAMIN B-1) tablet 100 mg  100 mg Oral Daily Verne Spurr, PA-C   100 mg at 11/18/12 0825    Lab Results: No results found for this or any previous visit (from the past 48 hour(s)).  Physical Findings: AIMS: Facial and Oral Movements Muscles of Facial Expression: None, normal Lips and Perioral Area: None,  normal Jaw: None, normal Tongue: None, normal,Extremity Movements Upper (arms, wrists, hands, fingers): None, normal Lower (legs, knees, ankles, toes): None, normal, Trunk Movements Neck, shoulders, hips: None, normal, Overall Severity Severity of abnormal movements (highest score from questions above): None, normal Incapacitation due to abnormal movements: None, normal Patient's awareness of abnormal movements (rate only patient's report): No Awareness, Dental Status Current problems with teeth and/or dentures?: No Does patient usually wear dentures?: No  CIWA:  CIWA-Ar Total: 7  COWS:  COWS Total Score: 9   Treatment Plan Summary: Daily contact with patient to assess and evaluate symptoms and progress in treatment Medication  management  Plan: Continue Detox. Will try Remeron for sleep tonight  Gaylan Fauver A 11/18/2012, 4:32 PM

## 2012-11-18 NOTE — Progress Notes (Signed)
Psychoeducational Group Note  Date:  11/18/2012 Time:  2000 Group Topic/Focus:  AA group  Participation Level:  Active  Participation Quality:  Appropriate  Affect:  Appropriate  Cognitive:  Appropriate  Insight:  Good  Engagement in Group:  Good  Additional Comments:    Joe Lawrence Patience 11/18/2012, 10:24 PM

## 2012-11-18 NOTE — Progress Notes (Signed)
Patient ID: Joe Lawrence, male   DOB: 12-17-1971, 41 y.o.   MRN: [id number] He has been up and about today and to groups. Stated that He felt better today. Self inventory: Depression 8, hopeless 7, W/d diarrhea , craving,  Agitation did not request any prn medication. Denies SI thoughts.  He has been pleasant and cooperative today requested and received prn for muscle aches this afternoon.

## 2012-11-18 NOTE — Progress Notes (Signed)
Patient ID: Joe Lawrence, male   DOB: 17-Mar-1971, 41 y.o.   MRN: [id number]  D: Pt was pleasant and cooperative, but was focused on meds that were due to be administered. Writer explained that some of the meds were discontinued, and explained that the protocol is tapered.   A: Support and encouragement was offered. 15 min checks continued for safety.  R: pt remains safe.

## 2012-11-18 NOTE — Clinical Social Work Note (Signed)
Aftercare Planning Group: 11/18/2012 9:45 AM  Pt attended discharge planning group and actively participated in group.  CSW provided pt with today's workbook.  Pt presents with flat affect and depressed mood.  Pt rates depression and anxiety at an 8 today.  Pt denies SI/HI.  Pt states that he is still sore from withdraw symptoms.  Pt is open to going to [facility] for further treatment.  The referral was made and waiting for [facility] to come and assess pt.  No further needs voiced by pt at this time.    [facility] Group Note : Clinical Social Worker Group Therapy  11/18/2012  1:15 PM  Type of Therapy:  Group Therapy - Process Group  Participation Level:  Appropriate  Participation Quality:  Appropriate   Affect:  Depressed  Cognitive:  Alert  Insight:  Good  Engagement in Group:  Good  Engagement in Therapy:  Good  Modes of Intervention:  Support  Summary of Progress/Problems: The topic of today's group was emotional regulation.  Patient actively participated in group.  Discussed slowly getting out of detox and taking just one day at a time.  Discussed feeling some pain in his withdrawal process, however is making more of an effort to stay out of his room and socialize more.  Discussed guilt around breaking into a pharmacy in order to steal drugs to support his habit.  Patient discussed putting this experience behind him and looking towards the future and the things he can control.  Patient reports plans to re-connect with his sponsor.  Chelsea Horton, LCSWA 11/18/2012 3:00 pm

## 2012-11-18 NOTE — Tx Team (Signed)
Interdisciplinary Treatment Plan Update (Adult)  Date:  11/18/2012  Time Reviewed:  10:05 AM   Progress in Treatment: Attending groups: Yes Participating in groups:  Yes Taking medication as prescribed: Yes Tolerating medication:  Yes Family/Significant othe contact madYes Patient understands diagnosis:  Yes Discussing patient identified problems/goals with staff:  Yes Medical problems stabilized or resolved:  Yes Denies suicidal/homicidal ideation: Yes Issues/concerns per patient self-inventory:  None identified Other: N/A  New problem(s) identified: None Identified  Reason for Continuation of Hospitalization: Anxiety Depression Medication stabilization  Interventions implemented related to continuation of hospitalization: mood stabilization, medication monitoring and adjustment, group therapy and psycho education, suicide risk assessment, collateral contact, aftercare planning, ongoing physician assessments and safety checks q 15 mins  Additional comments: N/A  Estimated length of stay: 3-4 days  Discharge Plan: CSW is assessing for appropriate referrals.  Pt is looking into [facility].    New goal(s): N/A  Review of initial/current patient goals per problem list:    1.  Goal(s): Address substance use by completing detox protocol  Met:  No  Target date: 4 days  As evidenced by: pt continues to endorse withdrawal symptoms.    2.  Goal (s): Reduce depressive symptoms from a 10 to a 3  Met:  No  Target date: 3-5 days  As evidenced by: Pt rates at an 8 today.    3.  Goal (s): Reduce anxiety symptoms from a 10 to a 3  Met:  No  Target date:  3-5 days  As evidenced by: Pt rates at an 8 today.     Attendees: Patient:     Family:     Physician: Geoffery Lyons, MD 11/18/2012 10:05 AM   Nursing: Roswell Miners, RN 11/18/2012 10:05 AM   Clinical Social Worker:  Reyes Ivan, LCSWA 11/18/2012  10:05 AM   Other: Burnetta Sabin, RN 11/18/2012  10:05 AM   Other:   Nanine Means, NP 11/18/2012 10:07 AM   Other:  Bubba Camp, Psyc intern 11/18/2012 10:07 AM   Other:     Other:      Scribe for Treatment Team:   Reyes Ivan 11/18/2012 10:05 AM

## 2012-11-18 NOTE — Progress Notes (Signed)
Patient ID: Joe Lawrence, male   DOB: 1971/08/04, 41 y.o.   MRN: [id number]  D:  Pt was anxious, but pleasant and cooperative. Pt continued to focus on meds, but once redirected pt was able to discuss his situation. Pt stated he was less jittery than previous days. Stated he was out of bed more today, but still has muscle aches. Pt reported less hopelessness today, but reported that he's only had 2 hrs sleep since his hospitalization. Pt requested to get his ativan and robaxin.  A:  Gave pt prn ativan. Offered support and encouragement. 15 min checks continued for safety.  R: Pt remains safe.

## 2012-11-18 NOTE — Progress Notes (Signed)
Patient ID: Joe Lawrence, male   DOB: May 13, 1971, 41 y.o.   MRN: [id number] HE has been up and to groups interacting with peers and staff. Self inventory: Depression 7, hopelessness 8, w/d's tremors, SI on and off .

## 2012-11-19 MED ORDER — LORAZEPAM 1 MG PO TABS
2.0000 mg | ORAL_TABLET | Freq: Once | ORAL | Status: AC
  Administered 2012-11-19: 2 mg via ORAL
  Filled 2012-11-19: qty 2

## 2012-11-19 MED ORDER — LORAZEPAM 2 MG/ML IJ SOLN: 1.0000 mg | Freq: Once | INTRAMUSCULAR | Status: AC

## 2012-11-19 MED ORDER — NICOTINE 7 MG/24HR TD PT24
7.0000 mg | MEDICATED_PATCH | Freq: Every day | TRANSDERMAL | Status: DC
  Administered 2012-11-19 – 2012-11-23 (×5): 7 mg via TRANSDERMAL
  Filled 2012-11-19 (×7): qty 1

## 2012-11-19 NOTE — Clinical Social Work Note (Signed)
Aftercare Planning Group: 11/19/2012 9:45 AM  Pt attended discharge planning group and actively participated in group.  CSW provided pt with today's workbook.  Pt presents with calm mood and affect.  Pt reports getting good sleep last night which made him feel better today.  Pt rates depression and anxiety at a 5 today.  Pt denies SI/HI.  Pt is still open to following up with [facility].  CSW continues to wait for pt to be assessed and a start date to be given.  No further needs voiced by pt at this time.  Safety planning and suicide prevention discussed.  Pt participated in discussion and acknowledged an understanding of the information provided.        [facility] Group Note : Clinical Social Worker Group Therapy  11/19/2012  1:15 PM  Type of Therapy:  Group Therapy - Process Group  Participation Level:  Appropriate  Participation Quality:  Appropriate   Affect:  Depressed  Cognitive:  Alert  Insight:  Good  Engagement in Group:  Good  Engagement in Therapy:  Good  Modes of Intervention:  Support  Summary of Progress/Problems:  The topic of today's group was on maintaining balance. Patient an active participant in group.  Discussed feeling as though he has wasted a lot of time in his addiction.  Discussed wanting to find balance again in his life and plans on working towards that.  Patient discussed having balance in the past and is confident that the will be able to obtain it again. Discussed his past behavior and having difficulty forgiving himself.  Chelsea Horton, LCSWA 11/19/2012 3:00 pm

## 2012-11-19 NOTE — Progress Notes (Signed)
D - Patient active on unit this evening. Patient interacting with peers and staff appropriately. Patient stated that the "remeron really helped him sleep last night." Patient denies SI/HI or any auditory or visual hallucinations.  A - Patient offered encouragement and support through therapeutic conversation. Encouraged patient to speak with staff about any questions or concerns. Medication given as ordered.  R - Maintained safety with continued Q 15 minute checks.

## 2012-11-19 NOTE — Progress Notes (Signed)
Patient ID: Joe Lawrence, male   DOB: Jul 19, 1971, 41 y.o.   MRN: [id number]    PER STATE REGULATIONS 482.30  THIS CHART WAS REVIEWED FOR MEDICAL NECESSITY WITH RESPECT TO THE PATIENT'S ADMISSION/ DURATION OF STAY.  NEXT REVIEW DATE: 11/22/2012  Willa Rough, RN, BSN CASE MANAGER

## 2012-11-19 NOTE — Progress Notes (Signed)
[facility] MD Progress Note  11/19/2012 6:34 PM Joe Lawrence  MRN:  [id number]  Diagnosis:  Alcohol Dependence, Opioid Dependence  ADL's:  Intact  Sleep: Fair  Appetite:  Poor  Suicidal Ideation:  Plan:  Deneis Intent:  Denies Means:  Denies Homicidal Ideation:  Plan:  Deneis Intent:  Denies Means:  Deneis  Reacting adversely to the food he is eating (pancreatic problems). Still has some withdrawal going on. He continues to deal with the regrets, the shame and quilt for what he did. Cant stop ruminating  Mental Status Examination/Evaluation: Objective:  Appearance: Fairly Groomed  Patent attorney::  Fair  Speech:  Clear and Coherent and Normal Rate  Volume:  Normal  Mood:  In Pain (after eating in the cafeteria)  Affect:  In Pain  Thought Process:  Circumstantial and Goal Directed  Orientation:  Full  Thought Content:  Somatic complains, withdrawal  Suicidal Thoughts:  No  Homicidal Thoughts:  No  Memory:  Immediate;   Fair Recent;   Fair Remote;   Fair  Judgement:  Intact  Insight:  Present  Psychomotor Activity:  Normal  Concentration:  Fair  Recall:  Fair  Akathisia:  No  Handed:  Right  AIMS (if indicated):     Assets:  Communication Skills Desire for Improvement Housing Social Support  Sleep:  Number of Hours: 0.5    Vital Signs:Blood pressure 130/81, pulse 105, temperature 98.2 F (36.8 C), temperature source Oral, resp. rate 16, height 5\' 7"  (1.702 m), weight 76.204 kg (168 lb). Current Medications: Current Facility-Administered Medications  Medication Dose Route Frequency Provider Last Rate Last Dose  . citalopram (CELEXA) tablet 40 mg  40 mg Oral Daily Rachael Fee, MD   40 mg at 11/19/12 [phone]  . lipase/protease/amylase (CREON-10/PANCREASE) capsule 1 capsule  1 capsule Oral TID WC Verne Spurr, PA-C   1 capsule at 11/19/12 1722  . loperamide (IMODIUM) capsule 2 mg  2 mg Oral PRN Verne Spurr, PA-C   2 mg at 11/17/12 1814  . [COMPLETED] LORazepam  (ATIVAN) tablet 2 mg  2 mg Oral Once Kerry Hough, PA   2 mg at 11/19/12 0113   Or  . [COMPLETED] LORazepam (ATIVAN) injection 1 mg  1 mg Intramuscular Once Kerry Hough, PA      . LORazepam (ATIVAN) tablet 1 mg  1 mg Oral Q6H PRN Rachael Fee, MD   1 mg at 11/19/12 1529  . methocarbamol (ROBAXIN) tablet 500 mg  500 mg Oral Q8H PRN Rachael Fee, MD   500 mg at 11/19/12 1722  . mirtazapine (REMERON) tablet 30 mg  30 mg Oral QHS Rachael Fee, MD   30 mg at 11/18/12 2121  . multivitamin with minerals tablet 1 tablet  1 tablet Oral Daily Verne Spurr, PA-C   1 tablet at 11/18/12 0825  . nicotine (NICODERM CQ - dosed in mg/24 hr) patch 7 mg  7 mg Transdermal Daily Rachael Fee, MD   7 mg at 11/19/12 1157  . pantoprazole (PROTONIX) EC tablet 40 mg  40 mg Oral Daily Verne Spurr, PA-C   40 mg at 11/19/12 5621  . phenol-menthol (CEPASTAT) lozenge 1 lozenge  1 lozenge Buccal PRN Verne Spurr, PA-C   1 lozenge at 11/13/12 1025  . QUEtiapine (SEROQUEL) tablet 150 mg  150 mg Oral QHS Verne Spurr, PA-C   150 mg at 11/18/12 2121  . thiamine (VITAMIN B-1) tablet 100 mg  100 mg Oral Daily Verne Spurr, PA-C  100 mg at 11/19/12 0838  . [DISCONTINUED] nicotine polacrilex (NICORETTE) gum 2 mg  2 mg Oral Q2H PRN Nehemiah Settle, MD   2 mg at 11/18/12 1959    Lab Results: No results found for this or any previous visit (from the past 48 hour(s)).  Physical Findings: AIMS: Facial and Oral Movements Muscles of Facial Expression: None, normal Lips and Perioral Area: None, normal Jaw: None, normal Tongue: None, normal,Extremity Movements Upper (arms, wrists, hands, fingers): None, normal Lower (legs, knees, ankles, toes): None, normal, Trunk Movements Neck, shoulders, hips: None, normal, Overall Severity Severity of abnormal movements (highest score from questions above): None, normal Incapacitation due to abnormal movements: None, normal Patient's awareness of abnormal movements (rate  only patient's report): No Awareness, Dental Status Current problems with teeth and/or dentures?: No Does patient usually wear dentures?: No  CIWA:  CIWA-Ar Total: 7  COWS:  COWS Total Score: 9   Treatment Plan Summary: Daily contact with patient to assess and evaluate symptoms and progress in treatment Medication management  Plan: Supportive approach/coping skills/relapse prevention           Pursue detox  Shamonica Schadt A 11/19/2012, 6:34 PM

## 2012-11-19 NOTE — Progress Notes (Signed)
Patient ID: Joe Lawrence, male   DOB: [birth year], 41 y.o.   MRN: [id number] He has been up and to group interacting with peers and staff. Stated that he had slept well last night after he got the Remeron.  Has requested and received prn for muscle aches and anxiety today.

## 2012-11-20 DIAGNOSIS — F1910 Other psychoactive substance abuse, uncomplicated: Secondary | ICD-10-CM

## 2012-11-20 DIAGNOSIS — F1010 Alcohol abuse, uncomplicated: Secondary | ICD-10-CM

## 2012-11-20 MED ORDER — QUETIAPINE FUMARATE 200 MG PO TABS
200.0000 mg | ORAL_TABLET | Freq: Every day | ORAL | Status: DC
  Administered 2012-11-20 – 2012-11-22 (×3): 200 mg via ORAL
  Filled 2012-11-20 (×2): qty 1
  Filled 2012-11-20: qty 7
  Filled 2012-11-20 (×2): qty 1

## 2012-11-20 MED ORDER — CLONIDINE HCL 0.1 MG PO TABS
0.1000 mg | ORAL_TABLET | Freq: Two times a day (BID) | ORAL | Status: DC | PRN
  Administered 2012-11-20 – 2012-11-22 (×3): 0.1 mg via ORAL

## 2012-11-20 NOTE — Clinical Social Work Note (Signed)
Aftercare Planning Group: 11/20/2012 9:45 AM  Pt attended discharge planning group and actively participated in group.  CSW provided pt with today's workbook.  Pt presents with anxious mood and affect.  Pt rates depression and anxiety at a 6 today.  Pt denies SI/HI.  Pt reports poor sleep and is tired today.  Pt will follow up with [facility] on Monday with an anticipated d/c this weekend.  No further needs voiced by pt at this time.    [facility] Group Note : Clinical Social Worker Group Therapy  11/20/2012  1:15 PM  Type of Therapy:  Group Therapy - Process Group  Participation Level:  Appropriate  Participation Quality:  Appropriate   Affect:  Appropriate  Cognitive:  Alert  Insight:  Good  Engagement in Group:  Good  Engagement in Therapy:  Good  Modes of Intervention:  Clarification, Education, Problem-solving, Socialization and Support  Summary of Progress/Problems: The topic for today was feelings about relapse.  Pt discussed what relapse prevention is to them and identified triggers that they are on the path to relapse.  Pt processed their feeling towards relapse and was able to relate to peers.  Pt discussed coping skills that can be used for relapse prevention.      Niajah Sipos Horton, LCSWA 11/20/2012 3:00 pm

## 2012-11-20 NOTE — Progress Notes (Signed)
Patient did attend the evening karaoke group.  

## 2012-11-20 NOTE — Progress Notes (Signed)
[facility] MD Progress Note  11/20/2012 4:27 PM Joe Lawrence  MRN:  [id number]  Diagnosis:  Opioid Dependence/withdrawal  ADL's:  Intact  Sleep: Poor  Appetite:  Fair  Suicidal Ideation:  Plan:  Denies Intent:  Denies Means:  Denies Homicidal Ideation:  Plan:  Denies Intent:  Denies Means:  denies  Having a hard time. He is having increased anxiety, worry. His blood pressure has gone up. He is still experiencing the flare up of the abdominal pain. Did not sleep at all last nigh  Mental Status Examination/Evaluation: Objective:  Appearance: Fairly Groomed  Patent attorney::  Fair  Speech:  Clear and Coherent and Slow  Volume:  Decreased  Mood:  Anxious and Depressed  Affect:  Restricted  Thought Process:  Coherent and Goal Directed  Orientation:  Full  Thought Content:  Rumination  Suicidal Thoughts:  No  Homicidal Thoughts:  No  Memory:  Immediate;   Fair Recent;   Fair Remote;   Fair  Judgement:  Fair  Insight:  Present  Psychomotor Activity:  Normal  Concentration:  Fair  Recall:  Fair  Akathisia:  No  Handed:  Right  AIMS (if indicated):     Assets:  Communication Skills Desire for Improvement Housing Social Support  Sleep:  Number of Hours: 0    Vital Signs:Blood pressure 148/92, pulse 73, temperature 97.6 F (36.4 C), temperature source Oral, resp. rate 18, height 5\' 7"  (1.702 m), weight 76.204 kg (168 lb). Current Medications: Current Facility-Administered Medications  Medication Dose Route Frequency Provider Last Rate Last Dose  . citalopram (CELEXA) tablet 40 mg  40 mg Oral Daily Rachael Fee, MD   40 mg at 11/20/12 0845  . cloNIDine (CATAPRES) tablet 0.1 mg  0.1 mg Oral BID PRN Rachael Fee, MD   0.1 mg at 11/20/12 1525  . lipase/protease/amylase (CREON-10/PANCREASE) capsule 1 capsule  1 capsule Oral TID WC Verne Spurr, PA-C   1 capsule at 11/20/12 1210  . loperamide (IMODIUM) capsule 2 mg  2 mg Oral PRN Verne Spurr, PA-C   2 mg at 11/17/12 1814    . LORazepam (ATIVAN) tablet 1 mg  1 mg Oral Q6H PRN Rachael Fee, MD   1 mg at 11/20/12 1013  . methocarbamol (ROBAXIN) tablet 500 mg  500 mg Oral Q8H PRN Rachael Fee, MD   500 mg at 11/20/12 1525  . mirtazapine (REMERON) tablet 30 mg  30 mg Oral QHS Rachael Fee, MD   30 mg at 11/19/12 2140  . multivitamin with minerals tablet 1 tablet  1 tablet Oral Daily Verne Spurr, PA-C   1 tablet at 11/18/12 0825  . nicotine (NICODERM CQ - dosed in mg/24 hr) patch 7 mg  7 mg Transdermal Daily Rachael Fee, MD   7 mg at 11/20/12 4098  . pantoprazole (PROTONIX) EC tablet 40 mg  40 mg Oral Daily Verne Spurr, PA-C   40 mg at 11/20/12 0845  . phenol-menthol (CEPASTAT) lozenge 1 lozenge  1 lozenge Buccal PRN Verne Spurr, PA-C   1 lozenge at 11/13/12 1025  . QUEtiapine (SEROQUEL) tablet 200 mg  200 mg Oral QHS Rachael Fee, MD      . thiamine (VITAMIN B-1) tablet 100 mg  100 mg Oral Daily Verne Spurr, PA-C   100 mg at 11/20/12 0845  . [DISCONTINUED] QUEtiapine (SEROQUEL) tablet 150 mg  150 mg Oral QHS Verne Spurr, PA-C   150 mg at 11/19/12 2140    Lab Results:  No results found for this or any previous visit (from the past 48 hour(s)).  Physical Findings: AIMS: Facial and Oral Movements Muscles of Facial Expression: None, normal Lips and Perioral Area: None, normal Jaw: None, normal Tongue: None, normal,Extremity Movements Upper (arms, wrists, hands, fingers): None, normal Lower (legs, knees, ankles, toes): None, normal, Trunk Movements Neck, shoulders, hips: None, normal, Overall Severity Severity of abnormal movements (highest score from questions above): None, normal Incapacitation due to abnormal movements: None, normal Patient's awareness of abnormal movements (rate only patient's report): No Awareness, Dental Status Current problems with teeth and/or dentures?: No Does patient usually wear dentures?: No  CIWA:  CIWA-Ar Total: 7  COWS:  COWS Total Score: 9   Treatment Plan  Summary: Daily contact with patient to assess and evaluate symptoms and progress in treatment Medication management  Plan:  Continue detox            Increase the Seroquel to 200 mg HS            Monitor blood pressure: Clonidine 0.1 mg BID PRN              Supportive approach/coping skills/relapse prevention [name] A 11/20/2012, 4:27 PM

## 2012-11-20 NOTE — Progress Notes (Signed)
Psychoeducational Group Note  Date:  11/20/2012 Time:  2000  Group Topic/Focus:  AA group  Participation Level:  Active  Participation Quality:  Appropriate  Affect:  Appropriate  Cognitive:  Alert  Insight:  Good  Engagement in Group:  Good  Additional Comments:    Sallee Hogrefe R 11/20/2012, 8:56 PM

## 2012-11-20 NOTE — Tx Team (Signed)
Interdisciplinary Treatment Plan Update (Adult)  Date:  11/20/2012  Time Reviewed:  9:35 AM   Progress in Treatment: Attending groups: Yes Participating in groups:  Yes Taking medication as prescribed: Yes Tolerating medication:  Yes Family/Significant othe contact made: Yes Patient understands diagnosis:  Yes Discussing patient identified problems/goals with staff:  Yes Medical problems stabilized or resolved:  Yes Denies suicidal/homicidal ideation: Yes Issues/concerns per patient self-inventory:  None identified Other: N/A  New problem(s) identified: None Identified  Reason for Continuation of Hospitalization: Anxiety Depression Medication stabilization Withdrawal symptoms  Interventions implemented related to continuation of hospitalization: mood stabilization, medication monitoring and adjustment, group therapy and psycho education, suicide risk assessment, collateral contact, aftercare planning, ongoing physician assessments and safety checks q 15 mins  Additional comments: N/A  Estimated length of stay: 1-2 days  Discharge Plan: Pt will follow up with [facility].    New goal(s): N/A  Review of initial/current patient goals per problem list:    1.  Goal(s): Address substance use by completing detox protocol  Met:  No  Target date: 4 days  As evidenced by: completes detox 11/25  2.  Goal (s): Reduce depressive symptoms from a 10 to a 3  Met:  No  Target date: 3-5 days  As evidenced by: Pt rates at a 6 today.    3.  Goal (s): Reduce anxiety symptoms from a 10 to a 3  Met:  No  Target date:  3-5 days  As evidenced by: Pt rates at a 6 today.    Attendees: Patient:     Family:     Physician: Geoffery Lyons, MD 11/20/2012 9:35 AM   Nursing: Alease Frame, RN 11/20/2012 9:35 AM   Clinical Social Worker:  Reyes Ivan, LCSWA 11/20/2012  9:35 AM   Other: Stephannie Li, RN 11/20/2012  9:35 AM   Other:     Other:     Other:     Other:      Scribe for  Treatment Team:   Reyes Ivan 11/20/2012 9:35 AM

## 2012-11-20 NOTE — Progress Notes (Signed)
D- Patient is out in dayroom and interacting in milieu. Continues to be focused on withdrawal symptoms.medication for pain control although not overtly med-seeking.  Denies SI.  C/O chills,cravings,agitation and tremors. Compliant with scheduled medications and prn Ativan requested and received.  Attending groups.  A- Support and encouragement offered. 12 steps concepts reenforced. Administered prn medications with education given.  Did not rate depression or hopelessness. R- Remains safe on unit. Affect/behavior appropriate.

## 2012-11-20 NOTE — Progress Notes (Signed)
[facility] Group Notes:  (Counselor/Nursing/MHT/Case Management/Adjunct)  11/20/2012 1:11 PM  Type of Therapy:  Psychoeducational Skills  Participation Level:  Active  Participation Quality:  Appropriate, Attentive and Sharing  Affect:  Anxious and Appropriate  Cognitive:  Confused  Insight:  Good  Engagement in Group:  Good  Engagement in Therapy:  Good  Modes of Intervention:  Activity, Problem-solving and Socialization  Summary of Progress/Problems: Pt participated in coping skills pictionary. Pt was engaged as he was trying to guess the coping skills that his team mates were drawing but pt appeared confused. Pt stated he had not slept a lot the night before. Pt appeared drowsy.      Dalia Heading 11/20/2012, 1:11 PM

## 2012-11-21 DIAGNOSIS — F429 Obsessive-compulsive disorder, unspecified: Secondary | ICD-10-CM

## 2012-11-21 NOTE — Progress Notes (Signed)
[facility] MD Progress Note  11/21/2012 2:39 PM Gwendolyn Carthan  MRN:  [id number]  Diagnosis:   Axis I: Obsessive Compulsive Disorder and Opiate dependence, alcohol dependence Axis II: Deferred Axis III:  Past Medical History  Diagnosis Date  . Chronic pancreatitis   . Anxiety   . Tobacco abuse   . PPD positive   . Bipolar disorder   . Pancreatitis   . Bronchitis   . Hx of suicide attempt     x 3  . Chronic back pain    Subjective: Joe Lawrence reports that he is making some progress. He did sleep about 5 hours last night, which is a big improvement. He continues to experience some mild withdrawal, but the worst is over. He speaks of wanting to get back to a program of recovery and living life the way he knows he can live it, free of drugs and alcohol. He denies any suicidal or homicidal ideation. He denies any auditory or visual hallucinations. He rates his depression as a 4 on a scale of 1-10, 10 being the highest. He rates his anxiety as a 4 of 10 as well.  ADL's:  Intact  Sleep: Fair  Appetite:  Fair  Suicidal Ideation:  Patient denies any thought, plan, or intent Homicidal Ideation:  Patient denies any thought, plan, or intent  AEB (as evidenced by):  Mental Status Examination/Evaluation: Objective:  Appearance: Disheveled  Eye Contact::  Good  Speech:  Clear and Coherent  Volume:  Normal  Mood:  Anxious  Affect:  Congruent  Thought Process:  Goal Directed  Orientation:  Full  Thought Content:  WDL  Suicidal Thoughts:  No  Homicidal Thoughts:  No  Memory:  Immediate;   Good Recent;   Good Remote;   Good  Judgement:  Fair  Insight:  Fair  Psychomotor Activity:  Tremor  Concentration:  Good  Recall:  Good  Akathisia:  No  Handed:    AIMS (if indicated):     Assets:  Communication Skills Desire for Improvement  Sleep:  Number of Hours: 4.5    Vital Signs:Blood pressure 130/96, pulse 110, temperature 98.3 F (36.8 C), temperature source Oral, resp. rate 18,  height 5\' 7"  (1.702 m), weight 76.204 kg (168 lb). Current Medications: Current Facility-Administered Medications  Medication Dose Route Frequency Provider Last Rate Last Dose  . citalopram (CELEXA) tablet 40 mg  40 mg Oral Daily Rachael Fee, MD   40 mg at 11/21/12 0813  . cloNIDine (CATAPRES) tablet 0.1 mg  0.1 mg Oral BID PRN Rachael Fee, MD   0.1 mg at 11/20/12 1525  . lipase/protease/amylase (CREON-10/PANCREASE) capsule 1 capsule  1 capsule Oral TID WC Verne Spurr, PA-C   1 capsule at 11/21/12 1203  . loperamide (IMODIUM) capsule 2 mg  2 mg Oral PRN Verne Spurr, PA-C   2 mg at 11/17/12 1814  . LORazepam (ATIVAN) tablet 1 mg  1 mg Oral Q6H PRN Rachael Fee, MD   1 mg at 11/21/12 1315  . methocarbamol (ROBAXIN) tablet 500 mg  500 mg Oral Q8H PRN Rachael Fee, MD   500 mg at 11/21/12 1315  . mirtazapine (REMERON) tablet 30 mg  30 mg Oral QHS Rachael Fee, MD   30 mg at 11/19/12 2140  . multivitamin with minerals tablet 1 tablet  1 tablet Oral Daily Verne Spurr, PA-C   1 tablet at 11/18/12 0825  . nicotine (NICODERM CQ - dosed in mg/24 hr) patch 7 mg  7 mg Transdermal Daily Rachael Fee, MD   7 mg at 11/21/12 [phone]  . pantoprazole (PROTONIX) EC tablet 40 mg  40 mg Oral Daily Verne Spurr, PA-C   40 mg at 11/21/12 0813  . phenol-menthol (CEPASTAT) lozenge 1 lozenge  1 lozenge Buccal PRN Verne Spurr, PA-C   1 lozenge at 11/13/12 1025  . QUEtiapine (SEROQUEL) tablet 200 mg  200 mg Oral QHS Rachael Fee, MD   200 mg at 11/20/12 2120  . thiamine (VITAMIN B-1) tablet 100 mg  100 mg Oral Daily Verne Spurr, PA-C   100 mg at 11/21/12 1191    Lab Results: No results found for this or any previous visit (from the past 48 hour(s)).  Physical Findings: AIMS: Facial and Oral Movements Muscles of Facial Expression: None, normal Lips and Perioral Area: None, normal Jaw: None, normal Tongue: None, normal,Extremity Movements Upper (arms, wrists, hands, fingers): None, normal Lower (legs,  knees, ankles, toes): None, normal, Trunk Movements Neck, shoulders, hips: None, normal, Overall Severity Severity of abnormal movements (highest score from questions above): None, normal Incapacitation due to abnormal movements: None, normal Patient's awareness of abnormal movements (rate only patient's report): No Awareness, Dental Status Current problems with teeth and/or dentures?: No Does patient usually wear dentures?: No  CIWA:  CIWA-Ar Total: 7  COWS:  COWS Total Score: 9   Treatment Plan Summary: Daily contact with patient to assess and evaluate symptoms and progress in treatment Medication management  Plan: We will continue his current plan of care. He plans to attend the intensive outpatient program at [facility] after discharge.  Syler Norcia 11/21/2012, 2:39 PM

## 2012-11-21 NOTE — Progress Notes (Signed)
Psychoeducational Group Note  Date: 11/21/2012  Time: 1315  Group Topic/Focus:  Personal Development  Participation Level: Active  Participation Quality: Appropriate  Affect: Bright Cognitive: Appropriate  Engagement in Group:Appropriate

## 2012-11-21 NOTE — Progress Notes (Signed)
D: Pt has been up and has been active and has been participating in various milieu activities today, pt has endorsed feelings of anxiety and withdrawal but has stated that he is feeling better, pt has denied any feelings of depression or suicidal thoughts A: Offered support and encouragement. 15 min checks continued for safety.  R: Pt remains safe, will continue to monitor.

## 2012-11-21 NOTE — Clinical Social Work Note (Signed)
[facility] Group Notes:  (Clinical Social Work)  11/21/2012  10:00-11:00AM  Summary of Progress/Problems:   The main focus of today's process group was for the patient to identify ways in which they have in the past sabotaged their own recovery and reasons they may have done this/what they received from doing it.  We then worked to identify a specific plan to avoid doing this when discharged from the hospital for this admission.  The patient expressed that his self-sabotage involved going back on pain medications for his pancreatitis instead of giving up coffee and smoking that was causing the pain.  Patient was very intellectually engaged during the group, and quoted the [employer] several times with things that other patients argued with.  He was engaged and interested, however, and was able to talk about how to avoid self-sabotage in the future.  Type of Therapy:  Group Therapy - Process  Participation Level:  Active  Participation Quality:  Attentive, Monopolizing, Redirectable and Sharing  Affect:  Appropriate  Cognitive:  Appropriate  Insight:  Good  Engagement in Group:  Good  Engagement in Therapy:  Good  Modes of Intervention:  Clarification, Education, Limit-setting, Problem-solving, Socialization, Support and Processing   Joe Mantle, LCSW 11/21/2012, 12:13 PM

## 2012-11-21 NOTE — Progress Notes (Signed)
[facility] Group Notes:  (Counselor/Nursing/MHT/Case Management/Adjunct)  11/21/2012 2100  Type of Therapy:  wrap up group  Participation Level:  Active  Participation Quality:  Appropriate, Attentive, Sharing and Supportive  Affect:  Appropriate and Excited  Cognitive:  Alert and Appropriate  Insight:  Good  Engagement in Group:  Good  Engagement in Therapy:  Good  Modes of Intervention:  Clarification, Education and Support  Summary of Progress/Problems:   Joe Lawrence 11/21/2012, 11:00 PM

## 2012-11-21 NOTE — Progress Notes (Signed)
Patient ID: Joe Lawrence, male   DOB: Feb 27, 1971, 41 y.o.   MRN: [id number] Has been resting quietly tonight, resp reg, unlabored, eyes closed, no c/o's voiced.  Will continue to monitor for safety.  Remains safe on unit.

## 2012-11-22 MED ORDER — TIZANIDINE HCL 2 MG PO TABS
4.0000 mg | ORAL_TABLET | Freq: Three times a day (TID) | ORAL | Status: DC | PRN
  Administered 2012-11-22 – 2012-11-23 (×2): 4 mg via ORAL
  Filled 2012-11-22 (×2): qty 2

## 2012-11-22 NOTE — Progress Notes (Signed)
Patient did attend the evening speaker AA meeting.  

## 2012-11-22 NOTE — Progress Notes (Signed)
Psychoeducational Group Note  Date: 11/22/2012  Time: 1315  Group Topic/Focus:  Support Systems Participation Level: Active  Participation Quality: Appropriate  Affect: Appropriate  Cognitive: Appropriate  Engagement in Group:Appropriate  

## 2012-11-22 NOTE — Clinical Social Work Note (Signed)
[facility] Group Notes:  (Clinical Social Work)  11/22/2012  10:00-11:00AM  Summary of Progress/Problems:   The main focus of today's process group was for the patient to define "support" and describe what healthy supports are, then to identify the patient's current support system and decide on other supports that can be put in place to prevent future hospitalizations.   An emphasis was placed on using therapist, doctor and problem-specific support groups to expand supports.  The patient expressed that he has been in the AA system in [place] for 15 years, and knows virtually everybody.  That is his support system.  He contributed significantly toward the discussion, but he did not volunteer any additional ideas for expanding his own supports.  Type of Therapy:  Group Therapy  Participation Level:  Active  Participation Quality:  Appropriate, Attentive, Sharing and Supportive  Affect:  Appropriate  Cognitive:  Alert, Appropriate and Oriented  Insight:  Good  Engagement in Group:  Good  Engagement in Therapy:  Good  Modes of Intervention:  Clarification, Education, Limit-setting, Problem-solving, Socialization, Support and Processing   Ambrose Mantle, LCSW 11/22/2012,

## 2012-11-22 NOTE — Progress Notes (Signed)
Patient ID: Joe Lawrence, male   DOB: September 30, 1971, 41 y.o.   MRN: [id number] D)  States is feeling better, but has been to the med window several times this evening, somewhat gamey,  Turned away with his meds and had to be told to stay at the window to take them.  Asked if I wanted to see under his tongue.  Attended group, interacting with select peers.  A)  Will continue to monitor for safety.  R)  Safety maintained.

## 2012-11-22 NOTE — Progress Notes (Signed)
Psychoeducational Group Note  Date:  11/22/2012 Time: 1130   Group Topic/Focus:  Spirituality:   The focus of this group is to discuss how one's spirituality can aide in recovery.  Participation Level:  Did Not Attend Meredith Staggers 11/22/2012, 2:11 PM

## 2012-11-22 NOTE — Progress Notes (Signed)
D: Pt has been up and has been visible in milieu, pt has participated in various milieu activities throughout the day pt has talked about doing somewhat better today but is still endorsing body aches, pt does still endorse some anxiety and depression but denies any suicidal thoughts. A: Offered support and encouragement. 15 min checks continued for safety.  R: Pt remains safe, will continue to monitor.

## 2012-11-22 NOTE — Progress Notes (Signed)
[facility] MD Progress Note  11/22/2012 11:51 AM Joe Lawrence  MRN:  [id number]  Diagnosis:   Axis I: Obsessive Compulsive Disorder and Opioid dependence and alcohol dependence Axis II: Deferred Axis III:  Past Medical History  Diagnosis Date  . Chronic pancreatitis   . Anxiety   . Tobacco abuse   . PPD positive   . Bipolar disorder   . Pancreatitis   . Bronchitis   . Hx of suicide attempt     x 3  . Chronic back pain     Subjective: Joe Lawrence reports he is doing somewhat better today. He continues to have body aches and difficulty sleeping. He denies any suicidal or homicidal ideation. He denies any auditory or visual hallucinations. His appetite is good. He reports that his anxiety and depression are about the same as yesterday, then rates his depression and anxiety both at a 5/10 on a scale of 1-10 where 10 is the highest.  ADL's:  Intact  Sleep: Poor  Appetite:  Good  Suicidal Ideation:  Patient denies any thought, plan, or intent Homicidal Ideation:  Patient denies any thought, plan, or intent  AEB (as evidenced by):  Mental Status Examination/Evaluation: Objective:  Appearance: Casual  Eye Contact::  Good  Speech:  Clear and Coherent  Volume:  Normal  Mood:  Anxious and Dysphoric  Affect:  Congruent  Thought Process:  Linear  Orientation:  Full  Thought Content:  WDL  Suicidal Thoughts:  No  Homicidal Thoughts:  No  Memory:  Immediate;   Good Recent;   Good Remote;   Good  Judgement:  Fair  Insight:  Fair  Psychomotor Activity:  Normal  Concentration:  Good  Recall:  Good  Akathisia:  No  Handed:    AIMS (if indicated):     Assets:  Communication Skills Desire for Improvement Social Support  Sleep:  Number of Hours: 4.5    Vital Signs:Blood pressure 124/85, pulse 109, temperature 97.8 F (36.6 C), temperature source Oral, resp. rate 16, height 5\' 7"  (1.702 m), weight 76.204 kg (168 lb). Current Medications: Current Facility-Administered  Medications  Medication Dose Route Frequency Provider Last Rate Last Dose  . citalopram (CELEXA) tablet 40 mg  40 mg Oral Daily Rachael Fee, MD   40 mg at 11/22/12 2841  . cloNIDine (CATAPRES) tablet 0.1 mg  0.1 mg Oral BID PRN Rachael Fee, MD   0.1 mg at 11/21/12 2206  . lipase/protease/amylase (CREON-10/PANCREASE) capsule 1 capsule  1 capsule Oral TID WC Verne Spurr, PA-C   1 capsule at 11/22/12 0704  . loperamide (IMODIUM) capsule 2 mg  2 mg Oral PRN Verne Spurr, PA-C   2 mg at 11/17/12 1814  . LORazepam (ATIVAN) tablet 1 mg  1 mg Oral Q6H PRN Rachael Fee, MD   1 mg at 11/22/12 0707  . methocarbamol (ROBAXIN) tablet 500 mg  500 mg Oral Q8H PRN Rachael Fee, MD   500 mg at 11/22/12 0707  . mirtazapine (REMERON) tablet 30 mg  30 mg Oral QHS Rachael Fee, MD   30 mg at 11/19/12 2140  . multivitamin with minerals tablet 1 tablet  1 tablet Oral Daily Verne Spurr, PA-C   1 tablet at 11/18/12 0825  . nicotine (NICODERM CQ - dosed in mg/24 hr) patch 7 mg  7 mg Transdermal Daily Rachael Fee, MD   7 mg at 11/22/12 0705  . pantoprazole (PROTONIX) EC tablet 40 mg  40 mg Oral Daily Lloyd Huger  Mashburn, PA-C   40 mg at 11/22/12 0951  . phenol-menthol (CEPASTAT) lozenge 1 lozenge  1 lozenge Buccal PRN Verne Spurr, PA-C   1 lozenge at 11/13/12 1025  . QUEtiapine (SEROQUEL) tablet 200 mg  200 mg Oral QHS Rachael Fee, MD   200 mg at 11/21/12 2119  . thiamine (VITAMIN B-1) tablet 100 mg  100 mg Oral Daily Verne Spurr, PA-C   100 mg at 11/22/12 1610    Lab Results: No results found for this or any previous visit (from the past 48 hour(s)).  Physical Findings: AIMS: Facial and Oral Movements Muscles of Facial Expression: None, normal Lips and Perioral Area: None, normal Jaw: None, normal Tongue: None, normal,Extremity Movements Upper (arms, wrists, hands, fingers): None, normal Lower (legs, knees, ankles, toes): None, normal, Trunk Movements Neck, shoulders, hips: None, normal, Overall  Severity Severity of abnormal movements (highest score from questions above): None, normal Incapacitation due to abnormal movements: None, normal Patient's awareness of abnormal movements (rate only patient's report): No Awareness, Dental Status Current problems with teeth and/or dentures?: No Does patient usually wear dentures?: No  CIWA:  CIWA-Ar Total: 7  COWS:  COWS Total Score: 9   Treatment Plan Summary: Daily contact with patient to assess and evaluate symptoms and progress in treatment Medication management  Plan: We will continue his current plan of care. His plan is to attend the chemical dependency intensive outpatient program at [facility] after discharge.  Joe Lawrence 11/22/2012, 11:51 AM

## 2012-11-22 NOTE — Progress Notes (Signed)
Psychoeducational Group Note  Date:  11/22/2012 Time:  1515  Group Topic/Focus:  Making Healthy Choices:   The focus of this group is to help patients identify negative/unhealthy choices they were using prior to admission and identify positive/healthier coping strategies to replace them upon discharge.  Participation Level:  Active  Participation Quality:  Appropriate  Affect:  Appropriate  Cognitive:  Appropriate  Insight:  Good  Engagement in Group:  Good  Additional Comments:    Mckenze Slone H 11/22/2012, 5:59 PM

## 2012-11-22 NOTE — Progress Notes (Signed)
D.  Pt bright and pleasant on approach, still does report some anxiety though and requested his ativan before attending group this evening.  Positive for AA group.  Interacting appropriately within milieu.  Requested BP to be taken and requested his prn clonidine tonight, see VS flowsheet.  Denies SI/HI/hallucinations at this time.  A.  Support and encouragement offered R.  No acute distress, will continue to monitor.

## 2012-11-23 MED ORDER — PANTOPRAZOLE SODIUM 40 MG PO TBEC: 40.0000 mg | DELAYED_RELEASE_TABLET | Freq: Every day | ORAL | Status: DC

## 2012-11-23 MED ORDER — QUETIAPINE FUMARATE 200 MG PO TABS: 200.0000 mg | ORAL_TABLET | Freq: Every day | ORAL | Status: DC

## 2012-11-23 MED ORDER — PANCRELIPASE (LIP-PROT-AMYL) 24000-76000 UNITS PO CPEP: 1.0000 | ORAL_CAPSULE | Freq: Three times a day (TID) | ORAL | Status: DC

## 2012-11-23 MED ORDER — CITALOPRAM HYDROBROMIDE 40 MG PO TABS: 40.0000 mg | ORAL_TABLET | Freq: Every day | ORAL | Status: DC

## 2012-11-23 MED ORDER — LORAZEPAM 1 MG PO TABS: ORAL_TABLET | ORAL | Status: DC

## 2012-11-23 MED ORDER — MIRTAZAPINE 30 MG PO TABS: 30.0000 mg | ORAL_TABLET | Freq: Every day | ORAL | Status: DC

## 2012-11-23 NOTE — Progress Notes (Signed)
D/C instructions/meds/follow-up appointments reviewed, pt verbalized understanding, pt's belongings returned to pt, samples given. 

## 2012-11-23 NOTE — Tx Team (Signed)
Interdisciplinary Treatment Plan Update (Adult)  Date:  11/23/2012  Time Reviewed:  10:11 AM   Progress in Treatment: Attending groups: Yes Participating in groups:  Yes Taking medication as prescribed: Yes Tolerating medication:  Yes Family/Significant othe contact made:  Yes Patient understands diagnosis:  Yes Discussing patient identified problems/goals with staff:  Yes Medical problems stabilized or resolved:  Yes Denies suicidal/homicidal ideation: Yes Issues/concerns per patient self-inventory:  None identified Other: N/A  New problem(s) identified: None Identified  Reason for Continuation of Hospitalization: Stable to d/c  Interventions implemented related to continuation of hospitalization: Stable to d/c  Additional comments: N/A  Estimated length of stay: D/C today  Discharge Plan: Pt will follow up with CDIOP at [facility].    New goal(s): N/A  Review of initial/current patient goals per problem list:    1.  Goal(s): Address substance use by completing detox protocol  Met:  Yes  Target date: today   As evidenced by: completed detox protocol  2.  Goal (s): Reduce depressive symptoms by reducing from a 10 to a 3  Met:  Yes  Target date: today  As evidenced by: Pt rates at a 4 today but reports feeling stable to d/c today.     3.  Goal (s): Reduce anxiety symptoms by reducing from a 10 to a 3  Met:  Yes  Target date: today  As evidenced by: Pt rates at a 5 today but reports feeling stable to d/c today.     Attendees: Patient:   11/23/2012 10:11 AM   Family:     Physician:  Geoffery Lyons, MD 11/23/2012 10:11 AM   Nursing: Roswell Miners, RN 11/23/2012 10:11 AM   Clinical Social Worker:  Reyes Ivan, LCSWA 11/23/2012 10:11 AM   Other: Robbie Louis, RN 11/23/2012 10:11 AM   Other:  Nanine Means, NP 11/23/2012 10:17 AM   Other:     Other:     Other:      Scribe for Treatment Team:   Reyes Ivan 11/23/2012 10:11 AM

## 2012-11-23 NOTE — Clinical Social Work Note (Signed)
[facility] Group Note : Clinical Social Worker Group Therapy  11/23/2012  1:15 PM  Type of Therapy:  Group Therapy - Process Group  Participation Level:  Appropriate  Participation Quality:  Appropriate   Affect:  Appropriate  Cognitive:  Alert  Insight:  Good  Engagement in Group:  Good  Engagement in Therapy:  Good  Modes of Intervention:  Clarification, Education, Problem-solving, Socialization and Support  Summary of Progress/Problems: The topic for group today was overcoming obstacles.  Pt discussed overcoming obstacles and what this means for pt. Pt discussed the shame he feels when he has to go back to AA and admit that he relapsed.  Pt shared ways he plans to prepare for this shame and work on gaining people's trust back.  Pt was supportive of peers in group discussion.     Hawken Bielby Horton, LCSWA 11/23/2012 3:00 pm

## 2012-11-23 NOTE — Discharge Summary (Signed)
Physician Discharge Summary Note  Patient:  Joe Lawrence is an 41 y.o., male MRN:  [id number] DOB:  1971-04-24 Patient phone:  [phone] (home)  Patient address:   [address] [place] [address],   Date of Admission:  11/12/2012 Date of Discharge: 11/23/2012  Reason for Admission:  Opioid abuse, alcohol relapse, sucidial  Discharge Diagnoses: Principal Problem:  *Opioid dependence Active Problems:  MARIJUANA ABUSE  Alcohol dependence  Axis Diagnosis:  AXIS I:  Alcohol Abuse and Substance Abuse AXIS II:  Deferred AXIS III:   Past Medical History  Diagnosis Date  . Chronic pancreatitis   . Anxiety   . Tobacco abuse   . PPD positive   . Bipolar disorder   . Pancreatitis   . Bronchitis   . Hx of suicide attempt     x 3  . Chronic back pain    AXIS IV:  economic problems, other psychosocial or environmental problems, problems related to social environment and problems with primary support group AXIS V:  61-70 mild symptoms  Level of Care:  IOP  Hospital Course:   Patient attended individual and group therapy while inpatient along with attending AA groups, one-one time with MD daily, medications for detox managed during inpatient, follow-up appointments made prior to discharge   Consults:  None  Significant Diagnostic Studies:  labs: Completed and reviewed in ED, stable  Discharge Vitals:   Blood pressure 129/89, pulse 81, temperature 97.7 F (36.5 C), temperature source Oral, resp. rate 16, height 5\' 7"  (1.702 m), weight 76.204 kg (168 lb). Lab Results:   Results for orders placed during the hospital encounter of 11/12/12 (from the past 72 hour(s))  GLUCOSE, CAPILLARY     Status: Abnormal   Collection Time   11/22/12  6:32 PM      Component Value Range Comment   Glucose-Capillary 121 (*) 70 - 99 mg/dL     Physical Findings: AIMS: Facial and Oral Movements Muscles of Facial Expression: None, normal Lips and Perioral Area: None, normal Jaw: None,  normal Tongue: None, normal,Extremity Movements Upper (arms, wrists, hands, fingers): None, normal Lower (legs, knees, ankles, toes): None, normal, Trunk Movements Neck, shoulders, hips: None, normal, Overall Severity Severity of abnormal movements (highest score from questions above): None, normal Incapacitation due to abnormal movements: None, normal Patient's awareness of abnormal movements (rate only patient's report): No Awareness, Dental Status Current problems with teeth and/or dentures?: No Does patient usually wear dentures?: No  CIWA:  CIWA-Ar Total: 7  COWS:  COWS Total Score: 9   Mental Status Exam: See Mental Status Examination and Suicide Risk Assessment completed by Attending Physician prior to discharge.  Discharge destination:  Home  Is patient on multiple antipsychotic therapies at discharge:  No   Has Patient had three or more failed trials of antipsychotic monotherapy by history:  No Recommended Plan for Multiple Antipsychotic Therapies: N/A  Discharge Orders    Future Orders Please Complete By Expires   Diet - low sodium heart healthy      Activity as tolerated - No restrictions          Medication List     As of 11/23/2012 11:09 AM    STOP taking these medications         fentaNYL 75 MCG/HR   Commonly known as: DURAGESIC - dosed mcg/hr      oxyCODONE 15 MG immediate release tablet   Commonly known as: ROXICODONE      TAKE these medications  Indication    citalopram 40 MG tablet   Commonly known as: CELEXA   Take 1 tablet (40 mg total) by mouth daily.    Indication: Depression      mirtazapine 30 MG tablet   Commonly known as: REMERON   Take 1 tablet (30 mg total) by mouth at bedtime.    Indication: Trouble Sleeping      Pancrelipase (Lip-Prot-Amyl) 24000 UNITS Cpep   Take 1-2 capsules (24,000-48,000 Units total) by mouth 3 (three) times daily with meals.    Indication: pancreatitis      pantoprazole 40 MG tablet   Commonly known as:  PROTONIX   Take 1 tablet (40 mg total) by mouth daily.    Indication: Gastroesophageal Reflux Disease      QUEtiapine 200 MG tablet   Commonly known as: SEROQUEL   Take 1 tablet (200 mg total) by mouth at bedtime.    Indication: Trouble Sleeping, mood stabilization           Follow-up Information    Follow up with [facility] - CDIOP. On 11/24/2012. (Start group at 1:00 pm on this date)    Contact information:   [address] [place], [place] [address] [phone]        Follow-up recommendations:  Activity as tolerated, low-sodium heart healthy diet   Comments:  Patient denied suicidal/homicidal ideations and auditory/visual hallucinations, follow-up appointments encouraged to attend, outside support groups encouraged and information given, follow-up at [facility] outpatient  Signed: Nanine Means, PMH-NP 11/23/2012, 11:09 AM

## 2012-11-23 NOTE — Progress Notes (Signed)
[facility] Case Management Discharge Plan:  Will you be returning to the same living situation after discharge: Yes,  returning home in [place] At discharge, do you have transportation home?:Yes,  pt has transportation home Do you have the ability to pay for your medications:Yes,  access to meds   Release of information consent forms completed and in the chart;  Patient's signature needed at discharge.  Patient to Follow up at:  Follow-up Information    Follow up with [facility] - [facility]. On 11/24/2012. (Start group at 1:00 pm on this date)    Contact information:   [address] [place], [place] [address] [phone]         Patient denies SI/HI:   Yes,  denies SI/Hi    Safety Planning and Suicide Prevention discussed:  Yes,  discussed with pt  Barrier to discharge identified:No.  Summary and Recommendations: Pt attended discharge planning group and actively participated in group.  CSW provided pt with today's workbook.  Pt presents with calm mood and affect.  Pt rates depression at a 4 and anxiety at a 5 today.  Pt denies SI/HI.  PT reports feeling stable to d/c today.  No recommendations from CSW.  No further needs voiced by pt.  Pt stable to discharge.     Carmina Miller 11/23/2012, 10:26 AM

## 2012-11-23 NOTE — [facility] Suicide Risk Assessment (Signed)
Suicide Risk Assessment  Discharge Assessment     Demographic Factors:  Male  Mental Status Per Nursing Assessment::   On Admission:  Intention to act on suicide plan  Current Mental Status by Physician: In full contact with reality. There are no suicidal ideas, plans or intent. His mood is euthymic, his affect is appropriate. He is committed to his recovery. He has dealt with the shame and quilt for his relapse. He is committed to make this work for him. He will come to the CD IOP. Will complete his detox on outpatient   Loss Factors: NA  Historical Factors: NA  Risk Reduction Factors:   Sense of responsibility to family and Positive social support  Continued Clinical Symptoms:  Depression:   Comorbid alcohol abuse/dependence Alcohol/Substance Abuse/Dependencies  Cognitive Features That Contribute To Risk: None identified   Suicide Risk:  Minimal: No identifiable suicidal ideation.  Patients presenting with no risk factors but with morbid ruminations; may be classified as minimal risk based on the severity of the depressive symptoms  Discharge Diagnoses:   AXIS I:  Opioid dependence, Alcohol Dependence, Major depression, Anxiety Disorder NOS AXIS II:  Deferred AXIS III:   Past Medical History  Diagnosis Date  . Chronic pancreatitis   . Anxiety   . Tobacco abuse   . PPD positive   . Bipolar disorder   . Pancreatitis   . Bronchitis   . Hx of suicide attempt     x 3  . Chronic back pain    AXIS IV:  problems related to legal system/crime AXIS V:  61-70 mild symptoms  Plan Of Care/Follow-up recommendations:  Activity:  As tolerated Diet:  Regular CD IOP [facility]  Is patient on multiple antipsychotic therapies at discharge:  No   Has Patient had three or more failed trials of antipsychotic monotherapy by history:  No  Recommended Plan for Multiple Antipsychotic Therapies: None identified   Carley Strickling A 11/23/2012, 1:14 PM

## 2012-11-24 ENCOUNTER — Other Ambulatory Visit (HOSPITAL_COMMUNITY): Payer: Medicare Other | Attending: Psychiatry | Admitting: Psychology

## 2012-11-24 DIAGNOSIS — F319 Bipolar disorder, unspecified: Secondary | ICD-10-CM | POA: Insufficient documentation

## 2012-11-24 DIAGNOSIS — K861 Other chronic pancreatitis: Secondary | ICD-10-CM | POA: Insufficient documentation

## 2012-11-24 DIAGNOSIS — G8929 Other chronic pain: Secondary | ICD-10-CM | POA: Insufficient documentation

## 2012-11-24 DIAGNOSIS — F411 Generalized anxiety disorder: Secondary | ICD-10-CM | POA: Insufficient documentation

## 2012-11-24 DIAGNOSIS — F1210 Cannabis abuse, uncomplicated: Secondary | ICD-10-CM | POA: Insufficient documentation

## 2012-11-24 DIAGNOSIS — F17200 Nicotine dependence, unspecified, uncomplicated: Secondary | ICD-10-CM | POA: Insufficient documentation

## 2012-11-24 DIAGNOSIS — M549 Dorsalgia, unspecified: Secondary | ICD-10-CM | POA: Insufficient documentation

## 2012-11-24 DIAGNOSIS — F1120 Opioid dependence, uncomplicated: Secondary | ICD-10-CM | POA: Insufficient documentation

## 2012-11-24 DIAGNOSIS — F1020 Alcohol dependence, uncomplicated: Secondary | ICD-10-CM | POA: Insufficient documentation

## 2012-11-25 ENCOUNTER — Other Ambulatory Visit (HOSPITAL_COMMUNITY): Payer: Medicare Other

## 2012-11-25 NOTE — Progress Notes (Signed)
Patient Discharge Instructions:  Next Level Care Provider Has Access to the EMR, 11/25/12 Records provided to [facility] via [facility]/Epic access.  Jerelene Redden, 11/25/2012, 10:04 AM

## 2012-11-27 ENCOUNTER — Other Ambulatory Visit (HOSPITAL_COMMUNITY): Payer: Medicare Other

## 2012-11-30 ENCOUNTER — Other Ambulatory Visit (HOSPITAL_COMMUNITY): Payer: Medicare Other | Attending: Psychiatry

## 2012-11-30 DIAGNOSIS — F1120 Opioid dependence, uncomplicated: Secondary | ICD-10-CM | POA: Insufficient documentation

## 2012-11-30 DIAGNOSIS — F1020 Alcohol dependence, uncomplicated: Secondary | ICD-10-CM | POA: Insufficient documentation

## 2012-11-30 DIAGNOSIS — F411 Generalized anxiety disorder: Secondary | ICD-10-CM | POA: Insufficient documentation

## 2012-11-30 DIAGNOSIS — G8929 Other chronic pain: Secondary | ICD-10-CM | POA: Insufficient documentation

## 2012-11-30 DIAGNOSIS — F17200 Nicotine dependence, unspecified, uncomplicated: Secondary | ICD-10-CM | POA: Insufficient documentation

## 2012-11-30 DIAGNOSIS — F319 Bipolar disorder, unspecified: Secondary | ICD-10-CM | POA: Insufficient documentation

## 2012-11-30 DIAGNOSIS — M549 Dorsalgia, unspecified: Secondary | ICD-10-CM | POA: Insufficient documentation

## 2012-12-01 ENCOUNTER — Telehealth (HOSPITAL_COMMUNITY): Payer: Self-pay | Admitting: Psychology

## 2012-12-02 ENCOUNTER — Other Ambulatory Visit (HOSPITAL_COMMUNITY): Payer: Medicare Other | Admitting: Psychology

## 2012-12-02 DIAGNOSIS — F1020 Alcohol dependence, uncomplicated: Secondary | ICD-10-CM

## 2012-12-02 DIAGNOSIS — F1920 Other psychoactive substance dependence, uncomplicated: Secondary | ICD-10-CM

## 2012-12-03 ENCOUNTER — Encounter (HOSPITAL_COMMUNITY): Payer: Self-pay | Admitting: Psychology

## 2012-12-03 ENCOUNTER — Telehealth (HOSPITAL_COMMUNITY): Payer: Self-pay

## 2012-12-03 LAB — PRESCRIPTION ABUSE MONITORING 17P, URINE
Buprenorphine, Urine: NEGATIVE ng/mL
Cannabinoid Scrn, Ur: NEGATIVE ng/mL
Creatinine, Urine: 31.96 mg/dL (ref >20.0)
Methadone Screen, Urine: NEGATIVE ng/mL
Opiate Screen, Urine: NEGATIVE ng/mL
Propoxyphene: NEGATIVE ng/mL
Tapentadol, urine: NEGATIVE ng/mL

## 2012-12-03 LAB — ALCOHOL METABOLITE (ETG), URINE: Ethyl Glucuronide (EtG): NEGATIVE ng/mL

## 2012-12-03 NOTE — Progress Notes (Signed)
    Daily Group Progress Note  Program: CD-IOP   Group Time: 1-2:30 pm  Participation Level: Active  Behavioral Response: Appropriate and Sharing  Type of Therapy: Process Group  Topic: Group Process: first part of group spent in process. Members shared about current issues and concerns. There were 2 group members who had not shared about their own lives and struggles and during this session they introduced themselves. The results of the drug tests collected on Monday were shared. One member admitted she had drunk alcohol over the weekend, but had failed to disclose this on Monday. There was a long discussion among members who expressed their concerns about this group member and her continued drinking despite serious liver disease. The group provided very validating feedback and support to this woman. This proved to be a very intense session with the focus going beyond the drinking to one's deeper sense of worth and purpose in the world.  Group Time: 2:45-4 pm  Participation Level: Active  Behavioral Response: Sharing  Type of Therapy: Psycho-education Group  Topic: Denial and other Cognitive Distortions: second half of group was spent in the ongoing discussion from the earlier session, but there was a handout and discussion on different forms of denial and cognitive distortions. Members were asked to identify examples of how they have rationalized, minimized, and distracted while they were in their active addictions. As the session neared the end, members were asked what they would be doing to support their recovery between now and the next session. While some reported they would be attending 12-step meetings, others used some of the previously mentioned distortions while others admitted they would not be doing much of anything.   Summary: this was the patient's second session and he was asked to share a little about his history. He was very open and honest about his recent relapse and  noted how hard it was to come back after having had 3 years of sobriety. The patient described the importance of the 12-step movement and how, when he was working the program as it is meant to be, he had really enjoyed his life. The patient was quick to point out how much pain he had caused his family because of his addiction. He made some good comments and displayed a very good understanding of the 12-steps. In the second half of group, the patient readily admitted he had used most of the cognitive distortions listed to avoid confrontation and any ensuing discussion about his problem. He reported his sobriety date is 11/9.   Family Program: Family present? No   Name of family member(s):   UDS collected: Yes Results: not back but should be positive for benz because he is on a 2-week benz taper per Dr. Dub Mikes.  AA/NA attended?: [place], Tuesday and Wednesday  Sponsor?: Yes   Rashawd Laskaris, LCAS

## 2012-12-04 ENCOUNTER — Other Ambulatory Visit (HOSPITAL_COMMUNITY): Payer: Medicare Other | Admitting: Psychology

## 2012-12-04 ENCOUNTER — Encounter (HOSPITAL_COMMUNITY): Payer: Self-pay

## 2012-12-04 ENCOUNTER — Encounter (HOSPITAL_COMMUNITY): Payer: Self-pay | Admitting: Psychology

## 2012-12-04 DIAGNOSIS — F1020 Alcohol dependence, uncomplicated: Secondary | ICD-10-CM

## 2012-12-04 DIAGNOSIS — F319 Bipolar disorder, unspecified: Secondary | ICD-10-CM

## 2012-12-04 DIAGNOSIS — F17200 Nicotine dependence, unspecified, uncomplicated: Secondary | ICD-10-CM

## 2012-12-04 DIAGNOSIS — F1120 Opioid dependence, uncomplicated: Secondary | ICD-10-CM

## 2012-12-04 LAB — BENZODIAZEPINES (GC/LC/MS), URINE
Alprazolam (GC/LC/MS), ur confirm: NEGATIVE ng/mL
Alprazolam metabolite (GC/LC/MS), ur confirm: 58 ng/mL
Clonazepam metabolite (GC/LC/MS), ur confirm: NEGATIVE ng/mL
Flunitrazepam metabolite (GC/LC/MS), ur confirm: NEGATIVE ng/mL
Flurazepam metabolite (GC/LC/MS), ur confirm: NEGATIVE ng/mL
Lorazepam (GC/LC/MS), ur confirm: NEGATIVE ng/mL
Temazepam (GC/LC/MS), ur confirm: NEGATIVE ng/mL
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
## 2012-12-04 NOTE — Progress Notes (Signed)
    Daily Group Progress Note  Program: CD-IOP   Group Time: 1-2:30 pm  Participation Level: Active  Behavioral Response: Sharing  Type of Therapy: Process Group  Topic: Group Process: the first half of group was spent in process. The group was very small today as the date of the group session had been changed from Wednesday to Tuesday and a number of members had reported previous commitments. This session was facilitated by a guest counselor Boneta Lucks) and these patient notes are taken from her written correspondence.   Group Time: 2:45-4pm  Participation Level: Active  Behavioral Response: Sharing  Type of Therapy: Psycho-education Group  Topic: Second part of session was spent in psycho-education. Members observed a film on compulsive/apathetic/healthy relationships and discussed the film and the various examples of relationships displayed in the film.   Summary: The patient reported he was planning for the upcoming holiday without using substances. This was his first day in group after being discharged from detox upstairs. Group members discussed the challenges of finding a sponsor. This patient reported he was struggling with significant anxiety related to detox and his upcoming court dates. Despite being medically safe, he is still suffering from PAWS and may be for some time. He admitted that he is currently managing the anxiety with coffee and cigarettes and they only serve to aggravate his pancreatitis which is why he relapsed after 3 years of sobriety. In the second half of group, the patient reported that he is not in a relationship currently, but admitted he has some compulsive tendencies. Patient did well in his first group session.  Family Program: Family present? Yes   Name of family member(s):   UDS collected: No Results:   AA/NA attended?: Yes, Monday, Tuesday, Wednesday, Thursday, Friday, Saturday and Sunday   Sponsor?: Yes   Vangie Henthorn,  LCAS

## 2012-12-04 NOTE — Discharge Summary (Signed)
Agree with assessment and plan Alwin Lanigan A. Kylia Grajales, M.D. 

## 2012-12-05 ENCOUNTER — Other Ambulatory Visit: Payer: Self-pay | Admitting: Family Medicine

## 2012-12-06 ENCOUNTER — Encounter (HOSPITAL_COMMUNITY): Payer: Self-pay | Admitting: Psychology
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
## 2012-12-07 ENCOUNTER — Other Ambulatory Visit (HOSPITAL_COMMUNITY): Payer: Medicare Other

## 2012-12-07 NOTE — Telephone Encounter (Signed)
Is this ok?

## 2012-12-09 ENCOUNTER — Other Ambulatory Visit (HOSPITAL_COMMUNITY): Payer: Medicare Other

## 2012-12-11 ENCOUNTER — Other Ambulatory Visit (HOSPITAL_COMMUNITY): Payer: Medicare Other

## 2012-12-14 ENCOUNTER — Other Ambulatory Visit (HOSPITAL_COMMUNITY): Payer: Medicare Other

## 2012-12-16 ENCOUNTER — Other Ambulatory Visit (HOSPITAL_COMMUNITY): Payer: Medicare Other

## 2012-12-16 NOTE — Progress Notes (Unsigned)
Joe Lawrence is a 41 y.o. male patient ***.        [facility]

## 2012-12-18 ENCOUNTER — Other Ambulatory Visit (HOSPITAL_COMMUNITY): Payer: Medicare Other

## 2012-12-21 ENCOUNTER — Other Ambulatory Visit (HOSPITAL_COMMUNITY): Payer: Medicare Other

## 2012-12-23 ENCOUNTER — Other Ambulatory Visit (HOSPITAL_COMMUNITY): Payer: Medicare Other

## 2012-12-25 ENCOUNTER — Other Ambulatory Visit (HOSPITAL_COMMUNITY): Payer: Medicare Other

## 2012-12-28 ENCOUNTER — Other Ambulatory Visit (HOSPITAL_COMMUNITY): Payer: Medicare Other

## 2013-01-01 ENCOUNTER — Other Ambulatory Visit (HOSPITAL_COMMUNITY): Payer: Medicare Other

## 2013-01-04 ENCOUNTER — Other Ambulatory Visit (HOSPITAL_COMMUNITY): Payer: Medicare Other

## 2013-01-06 ENCOUNTER — Other Ambulatory Visit (HOSPITAL_COMMUNITY): Payer: Medicare Other

## 2013-01-08 ENCOUNTER — Other Ambulatory Visit (HOSPITAL_COMMUNITY): Payer: Medicare Other

## 2013-01-11 ENCOUNTER — Other Ambulatory Visit (HOSPITAL_COMMUNITY): Payer: Medicare Other

## 2013-01-13 ENCOUNTER — Other Ambulatory Visit (HOSPITAL_COMMUNITY): Payer: Medicare Other

## 2013-01-15 ENCOUNTER — Other Ambulatory Visit (HOSPITAL_COMMUNITY): Payer: Medicare Other

## 2013-01-18 ENCOUNTER — Other Ambulatory Visit (HOSPITAL_COMMUNITY): Payer: Medicare Other

## 2013-01-20 ENCOUNTER — Other Ambulatory Visit (HOSPITAL_COMMUNITY): Payer: Medicare Other

## 2013-01-22 ENCOUNTER — Other Ambulatory Visit (HOSPITAL_COMMUNITY): Payer: Medicare Other

## 2013-01-25 ENCOUNTER — Other Ambulatory Visit (HOSPITAL_COMMUNITY): Payer: Medicare Other

## 2013-01-27 ENCOUNTER — Other Ambulatory Visit (HOSPITAL_COMMUNITY): Payer: Medicare Other

## 2013-01-29 ENCOUNTER — Other Ambulatory Visit (HOSPITAL_COMMUNITY): Payer: Medicare Other

## 2013-01-29 ENCOUNTER — Telehealth: Payer: Self-pay | Admitting: Internal Medicine

## 2013-01-29 NOTE — Telephone Encounter (Signed)
LAST O V 06/2012

## 2013-01-29 NOTE — Telephone Encounter (Signed)
Do not refill.  

## 2013-02-01 ENCOUNTER — Other Ambulatory Visit (HOSPITAL_COMMUNITY): Payer: Medicare Other

## 2013-03-15 ENCOUNTER — Telehealth: Payer: Self-pay | Admitting: Internal Medicine
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
## 2014-09-01 ENCOUNTER — Encounter: Payer: Self-pay | Admitting: Family Medicine

## 2014-09-01 ENCOUNTER — Ambulatory Visit (INDEPENDENT_AMBULATORY_CARE_PROVIDER_SITE_OTHER): Payer: Self-pay | Admitting: Family Medicine

## 2014-09-01 VITALS — BP 120/90 | HR 61 | Wt 157.0 lb

## 2014-09-01 DIAGNOSIS — F319 Bipolar disorder, unspecified: Secondary | ICD-10-CM

## 2014-09-01 MED ORDER — CITALOPRAM HYDROBROMIDE 20 MG PO TABS: 20.0000 mg | ORAL_TABLET | Freq: Every day | ORAL | Status: DC

## 2014-09-01 NOTE — Progress Notes (Signed)
   Subjective:    Patient ID: Joe Lawrence, male    DOB: [birth year], 43 y.o.   MRN: [id number]  HPI He is here for a followup visit. He recently 19 months in jail. While he was there he was off all medications. He did have chronic pancreatitis however at this time he is having no abdominal symptoms other than occasionally certain foods will cause trouble. He has a previous history of bipolar disorder and was on Seroquel as well as psych to help. He was seen in the mental health clinic and placed on 50 mg of Seroquel but he said it made his depression symptoms worse. Complains of being depressed, decreased focus as well as mood and being angry. Presently he is not smoking or drinking around any pain medications. He is involved in [place].   Review of Systems     Objective:   Physical Exam Alert and in no distress with appropriate affect       Assessment & Plan:  Bipolar disorder, unspecified - Plan: citalopram (CELEXA) 20 MG tablet  after discussion with him, I will place him back on Celexa at a lower dose or if he is to let me know how this works. I will probably add Seroquel back to his regimen at a later date. He is comfortable with this approach. Hopefully he will require the same dosing regimen. He is also to return here when he gets insurance for complete exam.

## 2014-09-13 ENCOUNTER — Telehealth: Payer: Self-pay | Admitting: Family Medicine

## 2014-09-13 MED ORDER — QUETIAPINE FUMARATE 50 MG PO TABS: 50.0000 mg | ORAL_TABLET | Freq: Every day | ORAL | Status: DC

## 2014-09-13 NOTE — Telephone Encounter (Signed)
Pt called & states has been on the antidepressant for couple weeks & wants to start the Seroquel ER, To [facility]

## 2014-12-20 ENCOUNTER — Encounter: Payer: Self-pay | Admitting: Family Medicine

## 2014-12-20 ENCOUNTER — Ambulatory Visit (INDEPENDENT_AMBULATORY_CARE_PROVIDER_SITE_OTHER): Payer: Self-pay | Admitting: Family Medicine

## 2014-12-20 VITALS — BP 130/84 | HR 70 | Wt 151.0 lb

## 2014-12-20 DIAGNOSIS — Z23 Encounter for immunization: Secondary | ICD-10-CM

## 2014-12-20 DIAGNOSIS — F42 Obsessive-compulsive disorder: Secondary | ICD-10-CM

## 2014-12-20 DIAGNOSIS — Z8719 Personal history of other diseases of the digestive system: Secondary | ICD-10-CM

## 2014-12-20 DIAGNOSIS — F429 Obsessive-compulsive disorder, unspecified: Secondary | ICD-10-CM

## 2014-12-20 LAB — COMPREHENSIVE METABOLIC PANEL
ALBUMIN: 4.5 g/dL (ref 3.5–5.2)
ALT: 12 U/L (ref 0–53)
AST: 14 U/L (ref 0–37)
Alkaline Phosphatase: 46 U/L (ref 39–117)
BUN: 10 mg/dL (ref 6–23)
CALCIUM: 9.9 mg/dL (ref 8.4–10.5)
CHLORIDE: 101 meq/L (ref 96–112)
CO2: 30 meq/L (ref 19–32)
Creat: 1 mg/dL (ref 0.50–1.35)
GLUCOSE: 84 mg/dL (ref 70–99)
Potassium: 4.2 mEq/L (ref 3.5–5.3)
SODIUM: 138 meq/L (ref 135–145)
TOTAL PROTEIN: 6.9 g/dL (ref 6.0–8.3)
Total Bilirubin: 0.7 mg/dL (ref 0.2–1.2)

## 2014-12-20 LAB — CBC WITH DIFFERENTIAL/PLATELET
BASOS ABS: 0 10*3/uL (ref 0.0–0.1)
BASOS PCT: 0 % (ref 0–1)
Eosinophils Absolute: 0 10*3/uL (ref 0.0–0.7)
Eosinophils Relative: 1 % (ref 0–5)
HCT: 41.5 % (ref 39.0–52.0)
Hemoglobin: 14.6 g/dL (ref 13.0–17.0)
LYMPHS PCT: 34 % (ref 12–46)
Lymphs Abs: 1.5 10*3/uL (ref 0.7–4.0)
MCH: 29.3 pg (ref 26.0–34.0)
MCHC: 35.2 g/dL (ref 30.0–36.0)
MCV: 83.3 fL (ref 78.0–100.0)
MONO ABS: 0.5 10*3/uL (ref 0.1–1.0)
MPV: 9.7 fL (ref 9.4–12.4)
Monocytes Relative: 11 % (ref 3–12)
NEUTROS ABS: 2.4 10*3/uL (ref 1.7–7.7)
NEUTROS PCT: 54 % (ref 43–77)
PLATELETS: 289 10*3/uL (ref 150–400)
RBC: 4.98 MIL/uL (ref 4.22–5.81)
RDW: 13.6 % (ref 11.5–15.5)
WBC: 4.4 10*3/uL (ref 4.0–10.5)

## 2014-12-20 LAB — AMYLASE: AMYLASE: 56 U/L (ref 0–105)

## 2014-12-20 LAB — LIPASE

## 2014-12-20 MED ORDER — PANCRELIPASE (LIP-PROT-AMYL) 24000-76000 UNITS PO CPEP: ORAL_CAPSULE | ORAL | Status: DC

## 2014-12-20 NOTE — Progress Notes (Signed)
   Subjective:    Patient ID: Joe Lawrence, male    DOB: 01/17/1971, 43 y.o.   MRN: [id number]  HPI He is here for consultation concerning increasing difficulty with abdominal gas. He states that he has normal BMs, no problems with eructation nausea or vomiting. He states that he now wakes up and feels as if he has a hangover although he is alcohol free He has tried OTC meds to help with this. He would like to be placed back on his Creon which he states has helped in the past. He has stopped taking the Celexa and Seroquel stating that although it helped to cause a lot of trouble with muscle cramping. He has not been on this in several months. He states his back is doing much better and he only occasionally has difficulty with this. He continues to go to [employer].   Review of Systems     Objective:   Physical Exam alert and in no distress. Tympanic membranes and canals are normal. Throat is clear. Tonsils are normal. Neck is supple without adenopathy or thyromegaly. Cardiac exam shows a regular sinus rhythm without murmurs or gallops. Lungs are clear to auscultation. Abdominal exam shows decreased bowel sounds without masses or tenderness.        Assessment & Plan:  History of pancreatitis - Plan: Pancrelipase, Lip-Prot-Amyl, (CREON) 24000 UNITS CPEP, Comprehensive metabolic panel, CBC with Differential, Lipase, Amylase  Obsessive-compulsive disorder  Immunization due  I will see how he does on the Creon. Make sure the blood work looks okay and eventually place him on the different psychotropic medication.

## 2014-12-26 ENCOUNTER — Telehealth: Payer: Self-pay

## 2014-12-26 MED ORDER — LAMOTRIGINE 25 MG PO TABS: ORAL_TABLET | ORAL | Status: DC

## 2014-12-26 NOTE — Telephone Encounter (Signed)
I called pt to give lab results and he asked me to have you to please call him

## 2014-12-26 NOTE — Telephone Encounter (Signed)
He has tried multiple medications in the past to help him psychologically including Paxil, Celexa, Lexapro, Prozac, Wellbutrin, Depakote, lithium and Seroquel. I will try him on Lamictal and start low. If further difficulty, psychiatric evaluation will be needed.

## 2014-12-26 NOTE — Telephone Encounter (Signed)
He is to come here in one month.

## 2014-12-26 NOTE — Addendum Note (Signed)
Addended by: Denita Lung on: 12/26/2014 12:15 PM   Modules accepted: Orders

## 2015-01-16 ENCOUNTER — Telehealth: Payer: Self-pay | Admitting: Family Medicine

## 2015-01-16 NOTE — Telephone Encounter (Signed)
Pt having muscles tightness, severe muscle cramps in rib cage and upper back area that is due to Lamictal. He was have slight muscle tightness but the tightness increased and the cramps started when pt increased the dose of medicine. Now the cramps are severe. He backed down the dose to one pill for one day & symptoms decreased a little. Then went back up on dose and cramps are severe again. How does pt stop medication or what should he do?

## 2015-01-16 NOTE — Telephone Encounter (Signed)
Left word for word message on pt machine 

## 2015-01-16 NOTE — Telephone Encounter (Signed)
Sounds as if he is having unacceptable side effects from the Lamictal. Have him stop it. He is going to need to get into see a psychiatrist concerning his mood disorder.

## 2015-03-02 ENCOUNTER — Telehealth: Payer: Self-pay

## 2015-03-02 NOTE — Telephone Encounter (Signed)
Patient left me a message 03/01/15 wanting to know if his TSH had been checked I looked through his chart and I do not see one. Called him back at his request and informed him and he has scheduled an appointment

## 2015-03-03 ENCOUNTER — Encounter: Payer: Self-pay | Admitting: Family Medicine

## 2015-03-03 ENCOUNTER — Ambulatory Visit (INDEPENDENT_AMBULATORY_CARE_PROVIDER_SITE_OTHER): Payer: Self-pay | Admitting: Family Medicine

## 2015-03-03 VITALS — BP 110/70 | HR 70 | Wt 151.0 lb

## 2015-03-03 DIAGNOSIS — R450 Nervousness: Secondary | ICD-10-CM

## 2015-03-03 DIAGNOSIS — F3110 Bipolar disorder, current episode manic without psychotic features, unspecified: Secondary | ICD-10-CM

## 2015-03-03 NOTE — Progress Notes (Signed)
   Subjective:    Patient ID: Joe Lawrence, male    DOB: [birth year], 44 y.o.   MRN: [id number]  HPI He is here for consult concerning feeling very hyper. He did trial Knechtel however cause difficulty with muscle cramping. He has been on various medications in the past however they have been unsuccessful. At the present time he is in between insurance but does plan to follow-up with psychiatry concerning this. He describes himself as being hyper and irritable but does not seem to have major mood swings. He cannot relate to truly being depressed. He is concerned about the possibility of this being thyroid in nature.   Review of Systems     Objective:   Physical Exam Alert and in no distress. No tremor noted. DTRs normal. Neck is supple without adenopathy. Cardiac exam shows regular rhythm without murmurs or gallops. Lungs are clear to auscultation. Skin appears normal.       Assessment & Plan:  Nervous irritability - Plan: TSH  Bipolar disorder, most recent episode manic, remission status unspecified  his symptoms are more consistent with bipolar disorder manic phase. Encouraged him to get an appointment as soon as possible with psychiatry to get on proper medication management.

## 2015-03-04 LAB — TSH: TSH: 2.347 u[IU]/mL (ref 0.350–4.500)

## 2015-06-27 ENCOUNTER — Encounter: Payer: Self-pay | Admitting: Family Medicine

## 2015-06-27 ENCOUNTER — Ambulatory Visit (INDEPENDENT_AMBULATORY_CARE_PROVIDER_SITE_OTHER): Payer: Self-pay | Admitting: Family Medicine

## 2015-06-27 VITALS — BP 120/66 | HR 56 | Wt 151.0 lb

## 2015-06-27 DIAGNOSIS — N410 Acute prostatitis: Secondary | ICD-10-CM

## 2015-06-27 MED ORDER — CIPROFLOXACIN HCL 500 MG PO TABS: 500.0000 mg | ORAL_TABLET | Freq: Two times a day (BID) | ORAL | Status: DC

## 2015-06-27 NOTE — Patient Instructions (Signed)
Take the antibiotic. you can also take 2 Aleve twice per day

## 2015-06-27 NOTE — Progress Notes (Signed)
   Subjective:    Patient ID: Joe Lawrence, male    DOB: 1971-04-14, 44 y.o.   MRN: [id number]  HPI He complains of a five-day history this started initially as back pain that did get worse with physical activities but then rapidly spread into his lower abdomen and right testicle area. Increased activity does cause discomfort. He has no dysuria urgency or frequency. No recent sexual activity.   Review of Systems     Objective:   Physical Exam Alert and in no distress. Abdominal exam shows no masses and questionable tenderness in the right periumbilical area. Penis and testes normal. Rectal exam does show a tender boggy prostate. Palpation of the prostate did cause referred pain to the right testes.       Assessment & Plan:  Prostatitis, acute - Plan: ciprofloxacin (CIPRO) 500 MG tablet Recommend 2 Aleve twice per day and explained that I could not totally explained away all of his symptoms from the prostate and this could also be musculoskeletal as well.

## 2016-05-28 ENCOUNTER — Other Ambulatory Visit: Payer: Self-pay | Admitting: Family Medicine

## 2016-05-28 NOTE — Telephone Encounter (Signed)
Is this okay to refill? 

## 2016-06-25 ENCOUNTER — Other Ambulatory Visit: Payer: Self-pay | Admitting: Family Medicine

## 2016-06-25 NOTE — Telephone Encounter (Signed)
Is this okay?

## 2016-07-30 ENCOUNTER — Other Ambulatory Visit: Payer: Self-pay | Admitting: Family Medicine

## 2016-07-30 NOTE — Telephone Encounter (Signed)
Is this okay to refill? 

## 2017-02-03 ENCOUNTER — Ambulatory Visit (INDEPENDENT_AMBULATORY_CARE_PROVIDER_SITE_OTHER): Payer: Medicare Other | Admitting: Family Medicine

## 2017-02-03 ENCOUNTER — Encounter: Payer: Self-pay | Admitting: Family Medicine

## 2017-02-03 VITALS — BP 120/70 | HR 80 | Ht 69.5 in | Wt 149.0 lb

## 2017-02-03 DIAGNOSIS — R5383 Other fatigue: Secondary | ICD-10-CM

## 2017-02-03 DIAGNOSIS — Z8719 Personal history of other diseases of the digestive system: Secondary | ICD-10-CM

## 2017-02-03 DIAGNOSIS — F1129 Opioid dependence with unspecified opioid-induced disorder: Secondary | ICD-10-CM

## 2017-02-03 DIAGNOSIS — F1021 Alcohol dependence, in remission: Secondary | ICD-10-CM

## 2017-02-03 DIAGNOSIS — K8689 Other specified diseases of pancreas: Secondary | ICD-10-CM | POA: Diagnosis not present

## 2017-02-03 DIAGNOSIS — F429 Obsessive-compulsive disorder, unspecified: Secondary | ICD-10-CM

## 2017-02-03 DIAGNOSIS — Z23 Encounter for immunization: Secondary | ICD-10-CM

## 2017-02-03 DIAGNOSIS — M545 Low back pain: Secondary | ICD-10-CM

## 2017-02-03 LAB — COMPREHENSIVE METABOLIC PANEL
ALT: 12 U/L (ref 9–46)
AST: 16 U/L (ref 10–40)
Albumin: 4.7 g/dL (ref 3.6–5.1)
Alkaline Phosphatase: 59 U/L (ref 40–115)
BILIRUBIN TOTAL: 1.1 mg/dL (ref 0.2–1.2)
BUN: 16 mg/dL (ref 7–25)
CO2: 30 mmol/L (ref 20–31)
CREATININE: 1.06 mg/dL (ref 0.60–1.35)
Calcium: 10.2 mg/dL (ref 8.6–10.3)
Chloride: 99 mmol/L (ref 98–110)
GLUCOSE: 95 mg/dL (ref 65–99)
Potassium: 4.8 mmol/L (ref 3.5–5.3)
SODIUM: 137 mmol/L (ref 135–146)
Total Protein: 7.2 g/dL (ref 6.1–8.1)

## 2017-02-03 LAB — CBC WITH DIFFERENTIAL/PLATELET
BASOS PCT: 1 %
Basophils Absolute: 38 cells/uL (ref 0–200)
EOS PCT: 2 %
Eosinophils Absolute: 76 cells/uL (ref 15–500)
HEMATOCRIT: 45.6 % (ref 38.5–50.0)
Hemoglobin: 15.1 g/dL (ref 13.2–17.1)
LYMPHS PCT: 24 %
Lymphs Abs: 912 cells/uL (ref 850–3900)
MCH: 28.9 pg (ref 27.0–33.0)
MCHC: 33.1 g/dL (ref 32.0–36.0)
MCV: 87.2 fL (ref 80.0–100.0)
MONO ABS: 418 {cells}/uL (ref 200–950)
MONOS PCT: 11 %
MPV: 10.3 fL (ref 7.5–12.5)
Neutro Abs: 2356 cells/uL (ref 1500–7800)
Neutrophils Relative %: 62 %
PLATELETS: 227 10*3/uL (ref 140–400)
RBC: 5.23 MIL/uL (ref 4.20–5.80)
RDW: 12.8 % (ref 11.0–15.0)
WBC: 3.8 10*3/uL — AB (ref 4.0–10.5)

## 2017-02-03 LAB — TSH: TSH: 2.43 mIU/L (ref 0.40–4.50)

## 2017-02-03 LAB — AMYLASE: Amylase: 53 U/L (ref 0–105)

## 2017-02-03 LAB — LIPASE: Lipase: 6 U/L — ABNORMAL LOW (ref 7–60)

## 2017-02-03 MED ORDER — CITALOPRAM HYDROBROMIDE 10 MG PO TABS: 10.0000 mg | ORAL_TABLET | Freq: Every day | ORAL | 1 refills | Status: DC

## 2017-02-03 MED ORDER — QUETIAPINE FUMARATE 25 MG PO TABS: 25.0000 mg | ORAL_TABLET | Freq: Every day | ORAL | 1 refills | Status: DC

## 2017-02-03 NOTE — Progress Notes (Signed)
Subjective:   HPI  Joe Lawrence is a 46 y.o. male who presents for a complete physical.  Medical care team includes:     Preventative care: Last ophthalmology visit: LONG TIME Last dental visit: 2017 Last colonoscopy: N/A Last prostate exam: ? Last EKG:09/27/12 Last labs:12/20/14  Prior vaccinations: TD or Tdap: ? Influenza:  Pneumococcal: 23: 09/29/12 : Advanced directive:no  Concerns:He seems to be holding his own concerning his sobriety does admit to being quite anxious. In the past he had done fairly well on Seroquel and Celexa. He would like to be placed back on that as he does feel quite anxious. At this time he cannot afford to get involved in counseling but hopefully will soon get on Medicaid which will make this much easier. Continues to have abdominal distress and is now taking an OTC medication for his angry attic insufficiency. He is on disability because of this. He is living with his mother. He is not dating anybody at the present time. Does have some back discomfort but thinks is mainly from the pancreatic issues.  Reviewed their medical, surgical, family, social, medication, and allergy history and updated chart as appropriate.    Review of Systems Constitutional: -fever, -chills, -sweats, -unexpected weight change, -decreased appetite, -fatigue Allergy: -sneezing, -itching, -congestion Dermatology: -changing moles, --rash, -lumps ENT: -runny nose, -ear pain, -sore throat, -hoarseness, -sinus pain, -teeth pain, - ringing in ears, -hearing loss, -nosebleeds Cardiology: -chest pain, -palpitations, -swelling, -difficulty breathing when lying flat, -waking up short of breath Respiratory: -cough, -shortness of breath, -difficulty breathing with exercise or exertion, -wheezing, -coughing up blood Gastroenterology: -abdominal pain, -nausea, -vomiting, -diarrhea, -constipation, -blood in stool, -changes in bowel movement, -difficulty swallowing or eating Hematology:  -bleeding, -bruising  Musculoskeletal: -joint aches, -muscle aches, -joint swelling, -back pain, -neck pain, -cramping, -changes in gait Ophthalmology: denies vision changes, eye redness, itching, discharge Urology: -burning with urination, -difficulty urinating, -blood in urine, -urinary frequency, -urgency, -incontinence Neurology: -headache, -weakness, -tingling, -numbness, -memory loss, -falls, -dizziness Psychology: -depressed mood, -agitation, -sleep problems     Objective:   Physical Exam  General appearance: alert, no distress, WD/WN,  Skin:Normal. HEENT: normocephalic, conjunctiva/corneas normal, sclerae anicteric, PERRLA, EOMi, nares patent, no discharge or erythema, pharynx normal Oral cavity: MMM, tongue normal, teeth normal Neck: supple, no lymphadenopathy, no thyromegaly, no masses, normal ROM Chest: non tender, normal shape and expansion Heart: RRR, normal S1, S2, no murmurs Lungs: CTA bilaterally, no wheezes, rhonchi, or rales Abdomen: +bs, soft, non tender, non distended, no masses, no hepatomegaly, no splenomegaly, no bruits Musculoskeletal: upper extremities non tender, no obvious deformity, normal ROM throughout, lower extremities non tender, no obvious deformity, normal ROM throughout Extremities: no edema, no cyanosis, no clubbing Pulses: 2+ symmetric, upper and lower extremities, normal cap refill Neurological: alert, oriented x 3, CN2-12 intact, strength normal upper extremities and lower extremities, sensation normal throughout, DTRs 2+ throughout, no cerebellar signs, gait normal Psychiatric: normal affect, behavior normal, pleasant  GU: normal male external genitalia, nontender, no masses, no hernia, no lymphadenopathy Rectal: Deferred. Guaiac cards given.   Assessment and Plan :   Obsessive-compulsive disorder, unspecified type - Plan: QUEtiapine (SEROQUEL) 25 MG tablet, citalopram (CELEXA) 10 MG tablet  History of pancreatitis - Plan: CBC with  Differential/Platelet, Comprehensive metabolic panel, Amylase, Lipase  Opioid dependence with opioid-induced disorder (HCC)  ALCOHOL ABUSE, HX OF  Low back pain, unspecified back pain laterality, unspecified chronicity, with sciatica presence unspecified  Need for influenza vaccination - Plan: Flu Vaccine QUAD 36+ mos  IM  Pancreatic insufficiency He will continue on his OTC med for the pancreatic insufficiency. I will do routine blood screening on him. I will place him back on Celexa and Seroquel and monitor him closely. Plan is to get him involved with counseling and possibly psychiatry if we cannot get him more stable. Prescription was written for TDaP however he will probably wakefully gets Medicaid before he gets this taken care of.  Physical exam - discussed healthy lifestyle, diet, exercise, preventative care, vaccinations, and addressed their concerns.     Follow-up one month.

## 2017-02-10 ENCOUNTER — Other Ambulatory Visit: Payer: Self-pay

## 2017-02-10 ENCOUNTER — Telehealth: Payer: Self-pay

## 2017-02-10 DIAGNOSIS — F429 Obsessive-compulsive disorder, unspecified: Secondary | ICD-10-CM

## 2017-02-10 MED ORDER — QUETIAPINE FUMARATE 25 MG PO TABS: 25.0000 mg | ORAL_TABLET | Freq: Two times a day (BID) | ORAL | 1 refills | Status: DC

## 2017-02-10 NOTE — Telephone Encounter (Signed)
Pt states that the seroquel is not helping him at the 25mg  daily. He would like to take 25mg  twice a day if possible. Please advise. [phone] Kitchen Joe Lawrence

## 2017-02-10 NOTE — Telephone Encounter (Signed)
Go ahead and call in the: Increased dosing regimen

## 2017-02-10 NOTE — Telephone Encounter (Signed)
Sent in 2 times a day

## 2017-03-04 DIAGNOSIS — R143 Flatulence: Secondary | ICD-10-CM | POA: Diagnosis not present

## 2017-03-04 DIAGNOSIS — F458 Other somatoform disorders: Secondary | ICD-10-CM | POA: Diagnosis not present

## 2017-03-04 DIAGNOSIS — K219 Gastro-esophageal reflux disease without esophagitis: Secondary | ICD-10-CM | POA: Diagnosis not present

## 2017-03-19 DIAGNOSIS — K219 Gastro-esophageal reflux disease without esophagitis: Secondary | ICD-10-CM | POA: Diagnosis not present

## 2017-03-19 DIAGNOSIS — K2900 Acute gastritis without bleeding: Secondary | ICD-10-CM | POA: Diagnosis not present

## 2017-03-19 DIAGNOSIS — R12 Heartburn: Secondary | ICD-10-CM | POA: Diagnosis not present

## 2017-03-19 LAB — HM COLONOSCOPY

## 2017-03-25 DIAGNOSIS — R12 Heartburn: Secondary | ICD-10-CM | POA: Diagnosis not present

## 2017-03-25 DIAGNOSIS — K2900 Acute gastritis without bleeding: Secondary | ICD-10-CM | POA: Diagnosis not present

## 2017-03-25 DIAGNOSIS — K219 Gastro-esophageal reflux disease without esophagitis: Secondary | ICD-10-CM | POA: Diagnosis not present

## 2017-04-01 ENCOUNTER — Encounter: Payer: Self-pay | Admitting: Family Medicine

## 2017-04-08 ENCOUNTER — Ambulatory Visit (INDEPENDENT_AMBULATORY_CARE_PROVIDER_SITE_OTHER): Payer: Medicare Other | Admitting: Family Medicine

## 2017-04-08 ENCOUNTER — Encounter: Payer: Self-pay | Admitting: Family Medicine

## 2017-04-08 VITALS — BP 118/80 | HR 64 | Wt 158.0 lb

## 2017-04-08 DIAGNOSIS — K219 Gastro-esophageal reflux disease without esophagitis: Secondary | ICD-10-CM | POA: Diagnosis not present

## 2017-04-08 DIAGNOSIS — J4520 Mild intermittent asthma, uncomplicated: Secondary | ICD-10-CM

## 2017-04-08 DIAGNOSIS — K8689 Other specified diseases of pancreas: Secondary | ICD-10-CM | POA: Diagnosis not present

## 2017-04-08 DIAGNOSIS — J301 Allergic rhinitis due to pollen: Secondary | ICD-10-CM | POA: Insufficient documentation

## 2017-04-08 MED ORDER — ALBUTEROL SULFATE HFA 108 (90 BASE) MCG/ACT IN AERS: 2.0000 | INHALATION_SPRAY | Freq: Four times a day (QID) | RESPIRATORY_TRACT | 0 refills | Status: DC | PRN

## 2017-04-08 MED ORDER — PANCRELIPASE (LIP-PROT-AMYL) 24000-76000 UNITS PO CPEP: ORAL_CAPSULE | ORAL | 11 refills | Status: DC

## 2017-04-08 NOTE — Progress Notes (Signed)
   Subjective:    Patient ID: Joe Lawrence, male    DOB: 29-Aug-1971, 46 y.o.   MRN: [id number]  HPI He is here for multiple issues. He does have underlying pancreatic insufficiency. He has been using OTC digestive enzymes as well as Creon. He is getting ready to go back on Medicaid the next several months. He also has had difficulty recently with reflux disease and did have an endoscopy which was essentially negative except for reflux. He was placed on Protonix which has helped with the reflux symptoms although she still does complain of a lump in his throat. Also over the last several years he has noted difficulty with springtime allergies with sneezing, itchy eyes, rhinorrhea and he has been using Claritin for this.   Review of Systems     Objective:   Physical Exam Her tendon no distress. Cardiac exam shows regular rhythm without murmurs or gallops. Lungs are clear to auscultation except with forced expiration causing wheezing Neck is supple without thyromegaly or lymphadenopathy.      Assessment & Plan:  Pancreatic insufficiency - Plan: Pancrelipase, Lip-Prot-Amyl, (CREON) 24000-76000 units CPEP  Chronic seasonal allergic rhinitis due to pollen  Mild intermittent extrinsic asthma without complication - Plan: albuterol (PROVENTIL HFA;VENTOLIN HFA) 108 (90 Base) MCG/ACT inhaler  Gastroesophageal reflux disease without esophagitis I will renew his Creon. He will continue on Claritin for his allergies and I will also give albuterol. Discussed placing him on a steroid inhaler if he has 2 use the inhaler more than twice a week during the day or twice a month at night. 10 you on the Protonix. No particular further evaluation for the lump in the throat. Like to see what happens with the use of a steroid inhaler.

## 2017-04-08 NOTE — Patient Instructions (Signed)
You have to use the inhaler more than twice a week during the day or twice a month at night let me know

## 2017-04-16 ENCOUNTER — Telehealth: Payer: Self-pay | Admitting: Family Medicine

## 2017-04-16 NOTE — Telephone Encounter (Signed)
Pt called and states that he is using the albuterol inhaler everyday twice a day because he is still having wheezing  And having the chest tightness, trying to get some relief, wants to know what the next step needs to be pt uses [place], [place] And pt can be reached at [phone]

## 2017-04-16 NOTE — Telephone Encounter (Signed)
Addendum come by and pick up an inhaler for free

## 2017-04-17 ENCOUNTER — Other Ambulatory Visit: Payer: Self-pay

## 2017-04-17 DIAGNOSIS — J4520 Mild intermittent asthma, uncomplicated: Secondary | ICD-10-CM

## 2017-04-17 MED ORDER — ALBUTEROL SULFATE HFA 108 (90 BASE) MCG/ACT IN AERS: 2.0000 | INHALATION_SPRAY | Freq: Four times a day (QID) | RESPIRATORY_TRACT | 1 refills | Status: DC | PRN

## 2017-04-17 NOTE — Telephone Encounter (Signed)
We do not have any so I sent in a refill for him

## 2017-04-18 NOTE — Telephone Encounter (Signed)
I left a different kind of inhaler at the front desk for him. Let him know it's one inhalation a day

## 2017-04-18 NOTE — Telephone Encounter (Signed)
Called pt to inform him to pick up new inhaler [facility]

## 2017-04-18 NOTE — Telephone Encounter (Signed)
Did you see this? 

## 2017-04-18 NOTE — Telephone Encounter (Signed)
Pt already has albuterol he was requesting something else.

## 2017-04-22 ENCOUNTER — Encounter: Payer: Self-pay | Admitting: Family Medicine

## 2017-04-22 ENCOUNTER — Ambulatory Visit (INDEPENDENT_AMBULATORY_CARE_PROVIDER_SITE_OTHER): Payer: Medicare Other | Admitting: Family Medicine

## 2017-04-22 VITALS — BP 140/90 | HR 73 | Wt 158.0 lb

## 2017-04-22 DIAGNOSIS — F419 Anxiety disorder, unspecified: Secondary | ICD-10-CM | POA: Diagnosis not present

## 2017-04-22 DIAGNOSIS — R4702 Dysphasia: Secondary | ICD-10-CM

## 2017-04-22 DIAGNOSIS — R0789 Other chest pain: Secondary | ICD-10-CM

## 2017-04-22 LAB — TSH: TSH: 2.07 m[IU]/L (ref 0.40–4.50)

## 2017-04-22 NOTE — Progress Notes (Signed)
   Subjective:    Patient ID: Joe Lawrence, male    DOB: Mar 09, 1971, 46 y.o.   MRN: [id number]  HPI He complains of difficulty with intermittent chest tightness, anxiety, irritability, decreased focus, difficulty with swallowing solids and liquids. He feels as if he is getting tight in his neck area. He also complains of muscle aches and pains. He has had no skin or hair changes, hot or cold intolerance. His bowel habits have been good. In general otherwise he seems be doing well. He is quite concerned over thyroid being an issue here.   Review of Systems     Objective:   Physical Exam Alert and in no distress. Tympanic membranes and canals are normal. Pharyngeal area is normal. Neck is supple without adenopathy or thyromegaly. Cardiac exam shows a regular sinus rhythm without murmurs or gallops. Lungs are clear to auscultation.       Assessment & Plan:  Dysphasia - Plan: DG Esophagus, TSH  Anxiety - Plan: DG Esophagus, TSH  Tightness in chest - Plan: DG Esophagus, TSH I reassured him that I did not think this was thyroid but will check a TSH again. Will also check barium swallow. Expressed to him the fact that this is most likely psychological in origin.

## 2017-04-24 ENCOUNTER — Ambulatory Visit (HOSPITAL_COMMUNITY)
Admission: RE | Admit: 2017-04-24 | Discharge: 2017-04-24 | Disposition: A | Discharge status: Home / Self Care | Payer: Medicare Other | Source: Ambulatory Visit | Attending: Family Medicine | Admitting: Family Medicine

## 2017-04-24 DIAGNOSIS — R0789 Other chest pain: Secondary | ICD-10-CM | POA: Insufficient documentation

## 2017-04-24 DIAGNOSIS — F419 Anxiety disorder, unspecified: Secondary | ICD-10-CM | POA: Diagnosis not present

## 2017-04-24 DIAGNOSIS — R4702 Dysphasia: Secondary | ICD-10-CM | POA: Diagnosis not present

## 2017-04-24 DIAGNOSIS — R1310 Dysphagia, unspecified: Secondary | ICD-10-CM | POA: Diagnosis not present

## 2017-08-25 ENCOUNTER — Ambulatory Visit (INDEPENDENT_AMBULATORY_CARE_PROVIDER_SITE_OTHER): Payer: Medicare Other | Admitting: Family Medicine

## 2017-08-25 ENCOUNTER — Encounter: Payer: Self-pay | Admitting: Family Medicine

## 2017-08-25 VITALS — BP 110/70 | HR 69 | Wt 153.0 lb

## 2017-08-25 DIAGNOSIS — Z23 Encounter for immunization: Secondary | ICD-10-CM

## 2017-08-25 DIAGNOSIS — J4520 Mild intermittent asthma, uncomplicated: Secondary | ICD-10-CM

## 2017-08-25 MED ORDER — BUDESONIDE 90 MCG/ACT IN AEPB: 1.0000 | INHALATION_SPRAY | Freq: Two times a day (BID) | RESPIRATORY_TRACT | 11 refills | Status: DC

## 2017-08-25 NOTE — Patient Instructions (Signed)
If you need your albuterol inhaler more than twice a week during the day or twice a month at night ,call me.

## 2017-08-25 NOTE — Progress Notes (Signed)
   Subjective:    Patient ID: Joe Lawrence, male    DOB: [birth year], 46 y.o.   MRN: [id number]  HPI He has a previous history of asthma but has not been on medicine quite some time. He has been using albuterol but has been eating and on a daily basis. He did check on the cost of inhalers and finds it Pulmicort is the best price for him. He has been using albuterol but has had unacceptable side effects of making him jittery   Review of Systems     Objective:   Physical Exam Alert and in no distress. Lungs are clear to auscultation. Cardiac exam shows       Assessment & Plan:  Mild intermittent extrinsic asthma without complication - Plan: Budesonide (PULMICORT FLEXHALER) 90 MCG/ACT inhaler  Need for influenza vaccination - Plan: Flu vaccine HIGH DOSE PF (Fluzone High dose) Flu shot given today. Discussed the use of Pulmicort twice per day. If he requires the use of his inhaler more than twice per week during the day or twice per month at night, he will let me know and I will increase his medication. I also consider using a laba/IC

## 2017-12-02 ENCOUNTER — Ambulatory Visit (INDEPENDENT_AMBULATORY_CARE_PROVIDER_SITE_OTHER): Payer: Medicare Other | Admitting: Family Medicine

## 2017-12-02 ENCOUNTER — Encounter: Payer: Self-pay | Admitting: Family Medicine

## 2017-12-02 VITALS — BP 132/70 | HR 58 | Resp 16 | Wt 152.4 lb

## 2017-12-02 DIAGNOSIS — J4520 Mild intermittent asthma, uncomplicated: Secondary | ICD-10-CM | POA: Diagnosis not present

## 2017-12-02 DIAGNOSIS — K148 Other diseases of tongue: Secondary | ICD-10-CM | POA: Diagnosis not present

## 2017-12-02 DIAGNOSIS — Z23 Encounter for immunization: Secondary | ICD-10-CM | POA: Diagnosis not present

## 2017-12-02 MED ORDER — BUDESONIDE 90 MCG/ACT IN AEPB: 2.0000 | INHALATION_SPRAY | Freq: Two times a day (BID) | RESPIRATORY_TRACT | 11 refills | Status: DC

## 2017-12-02 NOTE — Progress Notes (Signed)
   Subjective:    Patient ID: Joe Lawrence, male    DOB: 06/15/1971, 46 y.o.   MRN: [id number]  HPI He complains of a several day history of left-sided sore throat as well as a feeling of a lesion present on the left posterior part of the tongue and some ear discomfort.  No fever, chills, cough or congestion.  He does note some difficulty with wheezing and is using Pulmicort 1 twice per day.  He has been using his albuterol sometimes more than twice per week.  He does not smoke.   Review of Systems     Objective:   Physical Exam Alert and in no distress. Tympanic membranes and canals are normal. Pharyngeal area is normal.  Exam of the tongue does show a palpable lesion in the left posterior aspect of the tongue is slightly tender to palpation.  Neck is supple with 8 1 cm submandibular lymph node noted, no thyromegaly. Cardiac exam shows a regular sinus rhythm without murmurs or gallops. Lungs are show expiratory wheezing        Assessment & Plan:  Need for influenza vaccination - Plan: Flu Vaccine QUAD 6+ mos PF IM (Fluarix Quad PF)  Mild intermittent extrinsic asthma without complication - Plan: Budesonide (PULMICORT FLEXHALER) 90 MCG/ACT inhaler  Tongue lesion  I will have him increase his Pulmicort 2 puffs twice per day to see if this will decrease his need for albuterol.  I discussed the tongue lesion with him and hopefully this will go away.  If not he is to return here in 2-4 weeks for recheck.

## 2018-01-02 ENCOUNTER — Telehealth: Payer: Self-pay | Admitting: Family Medicine

## 2018-01-02 NOTE — Telephone Encounter (Signed)
Pt states has been taking Pulmicort for several months and not working good and also it's irritating nasal passages causes him to sneeze and burns real bad and asthma not getting better, wants to switch to [employer] and try that

## 2018-01-02 NOTE — Telephone Encounter (Signed)
He needs an appointment

## 2018-01-02 NOTE — Telephone Encounter (Signed)
sceduled pt appt for Monday

## 2018-01-05 ENCOUNTER — Encounter: Payer: Self-pay | Admitting: Family Medicine

## 2018-01-05 ENCOUNTER — Ambulatory Visit (INDEPENDENT_AMBULATORY_CARE_PROVIDER_SITE_OTHER): Payer: Medicare Other | Admitting: Family Medicine

## 2018-01-05 VITALS — BP 122/60 | HR 69 | Resp 16 | Wt 152.0 lb

## 2018-01-05 DIAGNOSIS — J4530 Mild persistent asthma, uncomplicated: Secondary | ICD-10-CM

## 2018-01-05 MED ORDER — BUDESONIDE-FORMOTEROL FUMARATE 80-4.5 MCG/ACT IN AERO: 2.0000 | INHALATION_SPRAY | Freq: Two times a day (BID) | RESPIRATORY_TRACT | 3 refills | Status: DC

## 2018-01-05 NOTE — Patient Instructions (Signed)
use2 puffs twice per day and call me in a week or 2 and let me know.

## 2018-01-05 NOTE — Progress Notes (Signed)
   Subjective:    Patient ID: Joe Lawrence, male    DOB: October 27, 1971, 47 y.o.   MRN: [id number]  HPI He is here for consult concerning continued difficulty with his asthma.  He has been using Pulmicort regularly at the prescribed dosing but still requires albuterol several times per week.  He also has been having difficulty with rhinorrhea and recently started Flonase.  Rhinorrhea was mainly on his left.   Review of Systems     Objective:   Physical Exam Alert and in no distress otherwise not examined      Assessment & Plan:  Mild persistent asthma without complication - Plan: budesonide-formoterol (SYMBICORT) 80-4.5 MCG/ACT inhaler  I will switch him to Symbicort.  I did give him a sample of the 160/4.5 as we did not have a lower strength.  He will keep me informed as to the efficacy of the higher dose versus a lower. Recommend he continue to use the Flonase.

## 2018-01-08 ENCOUNTER — Telehealth: Payer: Self-pay | Admitting: Family Medicine

## 2018-01-08 NOTE — Telephone Encounter (Signed)
Have him use it through the weekend and let me know Monday how he is doing

## 2018-01-08 NOTE — Telephone Encounter (Signed)
Pt says the Symbicort sample has not helped him at all. What should he do now?

## 2018-01-08 NOTE — Telephone Encounter (Signed)
Called and notified pt of this  

## 2018-03-10 ENCOUNTER — Telehealth: Payer: Self-pay | Admitting: Family Medicine

## 2018-03-10 DIAGNOSIS — J4530 Mild persistent asthma, uncomplicated: Secondary | ICD-10-CM

## 2018-03-10 MED ORDER — FLUTICASONE PROPIONATE HFA 220 MCG/ACT IN AERO: 1.0000 | INHALATION_SPRAY | Freq: Two times a day (BID) | RESPIRATORY_TRACT | 12 refills | Status: DC

## 2018-03-10 NOTE — Telephone Encounter (Signed)
Pt states Pulmicort is costing him a lot of money (takes 2 puffs twice a day) and Flovent is a lot less and would like to be switched.  Please send into [facility]

## 2018-03-10 NOTE — Telephone Encounter (Signed)
He states that the Symbicort did not work and he went back to Pulmicort which was too expensive.  I will switch him to Flovent which is cheaper.

## 2018-06-08 ENCOUNTER — Telehealth: Payer: Self-pay | Admitting: Family Medicine

## 2018-06-08 NOTE — Telephone Encounter (Signed)
My notes indicate he is on Flovent.  If this is working there is no reason to switch to find out why he wants to

## 2018-06-08 NOTE — Telephone Encounter (Signed)
Patient called and states he is back on Medicaid, so can he start using the Pulmocort again?  It is only $4.00 now?  Also can he have a 180 mcg unit instead of 90 mcg unit?  Pt ph [place]

## 2018-06-09 ENCOUNTER — Other Ambulatory Visit: Payer: Self-pay

## 2018-06-09 ENCOUNTER — Other Ambulatory Visit: Payer: Self-pay | Admitting: Family Medicine

## 2018-06-09 DIAGNOSIS — J4520 Mild intermittent asthma, uncomplicated: Secondary | ICD-10-CM

## 2018-06-09 NOTE — Telephone Encounter (Signed)
Please advise if [place] can ill pt ventolin. [place]

## 2018-06-09 NOTE — Progress Notes (Signed)
Pt is taking pulmicort again due to insurance. kh

## 2018-06-19 ENCOUNTER — Telehealth: Payer: Self-pay

## 2018-06-19 NOTE — Telephone Encounter (Signed)
Called pt about depression screening . No answer LVM. [place]

## 2018-09-03 ENCOUNTER — Encounter: Payer: Self-pay | Admitting: Family Medicine

## 2018-09-03 ENCOUNTER — Ambulatory Visit (INDEPENDENT_AMBULATORY_CARE_PROVIDER_SITE_OTHER): Payer: Medicare Other | Admitting: Family Medicine

## 2018-09-03 VITALS — BP 124/80 | HR 67 | Temp 97.5°F | Wt 148.0 lb

## 2018-09-03 DIAGNOSIS — Z23 Encounter for immunization: Secondary | ICD-10-CM | POA: Diagnosis not present

## 2018-09-03 DIAGNOSIS — K219 Gastro-esophageal reflux disease without esophagitis: Secondary | ICD-10-CM

## 2018-09-03 DIAGNOSIS — J4530 Mild persistent asthma, uncomplicated: Secondary | ICD-10-CM | POA: Diagnosis not present

## 2018-09-03 MED ORDER — PANTOPRAZOLE SODIUM 40 MG PO TBEC: 40.0000 mg | DELAYED_RELEASE_TABLET | Freq: Two times a day (BID) | ORAL | 3 refills | Status: DC

## 2018-09-03 MED ORDER — FLUTICASONE PROPIONATE HFA 220 MCG/ACT IN AERO: 2.0000 | INHALATION_SPRAY | Freq: Two times a day (BID) | RESPIRATORY_TRACT | 3 refills | Status: DC

## 2018-09-03 NOTE — Progress Notes (Signed)
   Subjective:    Patient ID: Joe Lawrence, male    DOB: 1971/06/10, 47 y.o.   MRN: [id number]  HPI He is here for medication management visit.  He does have reflux disease and has seen GI in the past.  He is taking Protonix but requires twice daily dosing to keep his reflux symptoms under control.  He would like to continue on his medication.  He also has asthma and presently is on Flovent.  He is taking 220 twice daily but still requiring the use of Proventil least 2 or 3 times per week.   Review of Systems     Objective:   Physical Exam Alert and in no distress otherwise not examined       Assessment & Plan:  Mild persistent asthma without complication - Plan: fluticasone (FLOVENT HFA) 220 MCG/ACT inhaler  Need for influenza vaccination - Plan: Flu Vaccine QUAD 6+ mos PF IM (Fluarix Quad PF)  Gastroesophageal reflux disease without esophagitis - Plan: pantoprazole (PROTONIX) 40 MG tablet I will continue him on his present dosing of Protonix which is twice daily and give him a 21-month supply.  We will also increase his Flovent to 2 puffs twice daily.  Again 90-day supply and if there is a question with that, he will let me know.

## 2018-10-29 ENCOUNTER — Telehealth: Payer: Self-pay

## 2018-10-29 NOTE — Telephone Encounter (Signed)
Called pt for fall screening. LVM for pt to call office back [facility]

## 2019-08-10 ENCOUNTER — Other Ambulatory Visit: Payer: Self-pay | Admitting: Family Medicine

## 2019-08-10 DIAGNOSIS — J4520 Mild intermittent asthma, uncomplicated: Secondary | ICD-10-CM

## 2019-08-11 NOTE — Telephone Encounter (Signed)
[facility] is requesting to fill pt ventolin. Please advise [facility]

## 2019-08-24 ENCOUNTER — Other Ambulatory Visit: Payer: Self-pay | Admitting: Family Medicine

## 2019-08-24 DIAGNOSIS — K219 Gastro-esophageal reflux disease without esophagitis: Secondary | ICD-10-CM

## 2019-09-09 ENCOUNTER — Other Ambulatory Visit: Payer: Self-pay | Admitting: Family Medicine

## 2019-09-09 DIAGNOSIS — J4530 Mild persistent asthma, uncomplicated: Secondary | ICD-10-CM

## 2019-09-09 NOTE — Telephone Encounter (Signed)
[facility] is requesting to fill pt flovent. Please advise [facility]

## 2019-09-13 ENCOUNTER — Telehealth: Payer: Self-pay

## 2019-09-13 NOTE — Telephone Encounter (Signed)
LVM for pt to call back to advise pt need for covid screening question. Joe Lawrence

## 2019-09-14 NOTE — Telephone Encounter (Signed)
ERROR

## 2019-09-22 ENCOUNTER — Encounter: Payer: Self-pay | Admitting: Family Medicine

## 2019-09-22 ENCOUNTER — Other Ambulatory Visit: Payer: Self-pay

## 2019-09-22 ENCOUNTER — Ambulatory Visit (INDEPENDENT_AMBULATORY_CARE_PROVIDER_SITE_OTHER): Payer: Medicare Other | Admitting: Family Medicine

## 2019-09-22 VITALS — BP 116/74 | HR 77 | Temp 98.4°F | Ht 68.0 in | Wt 159.2 lb

## 2019-09-22 DIAGNOSIS — Z8719 Personal history of other diseases of the digestive system: Secondary | ICD-10-CM | POA: Diagnosis not present

## 2019-09-22 DIAGNOSIS — K219 Gastro-esophageal reflux disease without esophagitis: Secondary | ICD-10-CM | POA: Diagnosis not present

## 2019-09-22 DIAGNOSIS — Z23 Encounter for immunization: Secondary | ICD-10-CM

## 2019-09-22 DIAGNOSIS — F1011 Alcohol abuse, in remission: Secondary | ICD-10-CM | POA: Diagnosis not present

## 2019-09-22 DIAGNOSIS — K8689 Other specified diseases of pancreas: Secondary | ICD-10-CM | POA: Diagnosis not present

## 2019-09-22 DIAGNOSIS — Z13220 Encounter for screening for lipoid disorders: Secondary | ICD-10-CM | POA: Diagnosis not present

## 2019-09-22 DIAGNOSIS — L709 Acne, unspecified: Secondary | ICD-10-CM

## 2019-09-22 DIAGNOSIS — Z1211 Encounter for screening for malignant neoplasm of colon: Secondary | ICD-10-CM

## 2019-09-22 DIAGNOSIS — Z0000 Encounter for general adult medical examination without abnormal findings: Secondary | ICD-10-CM | POA: Diagnosis not present

## 2019-09-22 DIAGNOSIS — J4520 Mild intermittent asthma, uncomplicated: Secondary | ICD-10-CM

## 2019-09-22 MED ORDER — LEVALBUTEROL TARTRATE 45 MCG/ACT IN AERO: 2.0000 | INHALATION_SPRAY | Freq: Four times a day (QID) | RESPIRATORY_TRACT | 12 refills | Status: DC | PRN

## 2019-09-22 NOTE — Progress Notes (Signed)
Joe Lawrence is a 48 y.o. male who presents for annual wellness visit and follow-up on chronic medical conditions.  He has underlying intermittent asthma and does use albuterol however he is not really happy with its effectiveness and it also makes him quite jittery.  He would like to try different medication.  He also has a question about hernia and would like this checked out.  He also has noted some acne on his back that is new.  He is no longer taking Creon for his pancreatic insufficiency.  His reflux seems to be under good control with twice a day dosing of Protonix.  He is alcohol free and has been that way for at least 7 years.  He is on disability.  He does not work and does play disc golf.   Immunizations and Health Maintenance Immunization History  Administered Date(s) Administered  . Influenza, High Dose Seasonal PF 08/25/2017  . Influenza,inj,Quad PF,6+ Mos 12/20/2014, 02/03/2017, 12/02/2017, 09/03/2018  . PPD Test 04/22/1985  . Pneumococcal Polysaccharide-23 09/29/2012   Health Maintenance Due  Topic Date Due  . HIV Screening  01/27/1984  . TETANUS/TDAP  01/27/1988  . INFLUENZA VACCINE  07/31/2019    Last colonoscopy: 03/19/17 endoscopy  Last PSA: N/A Dentist: two years ago Ophtho: never Exercise: very little  Other doctors caring for patient include:  Dr. Michail Sermon [facility],  Advanced Directives: Not discussed    Depression screen:  See questionnaire below.     Depression screen [facility] 2/9 09/22/2019 09/03/2018 02/03/2017  Decreased Interest 3 0 0  Down, Depressed, Hopeless 0 0 0  PHQ - 2 Score 3 0 0  Altered sleeping 0 - -  Tired, decreased energy 3 - -  Change in appetite 0 - -  Feeling bad or failure about yourself  0 - -  Trouble concentrating 0 - -  Moving slowly or fidgety/restless 0 - -  Suicidal thoughts 0 - -  PHQ-9 Score 6 - -  Difficult doing work/chores Not difficult at all - -    Fall Screen: See Questionaire below.   Fall Risk  09/22/2019 02/03/2017   Falls in the past year? 0 No    ADL screen:  See questionnaire below.  Functional Status Survey: Is the patient deaf or have difficulty hearing?: No Does the patient have difficulty seeing, even when wearing glasses/contacts?: No Does the patient have difficulty concentrating, remembering, or making decisions?: No Does the patient have difficulty walking or climbing stairs?: No Does the patient have difficulty dressing or bathing?: No Does the patient have difficulty doing errands alone such as visiting a doctor's office or shopping?: No   Review of Systems  Constitutional: -, -unexpected weight change, -anorexia, -fatigue Allergy: -sneezing, -itching, -congestion Dermatology: denies changing moles, rash, lumps ENT: -runny nose, -ear pain, -sore throat,  Cardiology:  -chest pain, -palpitations, -orthopnea, Respiratory: -cough, -shortness of breath, -dyspnea on exertion, -wheezing,  Gastroenterology: -abdominal pain, -nausea, -vomiting, -diarrhea, -constipation, -dysphagia Hematology: -bleeding or bruising problems Musculoskeletal: -arthralgias, -myalgias, -joint swelling, -back pain, - Ophthalmology: -vision changes,  Urology: -dysuria, -difficulty urinating,  -urinary frequency, -urgency, incontinence Neurology: -, -numbness, , -memory loss, -falls, -dizziness    PHYSICAL EXAM:  BP 116/74 (BP Location: Left Arm, Patient Position: Sitting)   Pulse 77   Temp 98.4 F (36.9 C)   Ht 5\' 8"  (1.727 m)   Wt 159 lb 3.2 oz (72.2 kg)   SpO2 97%   BMI 24.21 kg/m   General Appearance: Alert, cooperative, no distress, appears stated  age Head: Normocephalic, without obvious abnormality, atraumatic Eyes: PERRL, conjunctiva/corneas clear, EOM's intact, fundi benign Ears: Normal TM's and external ear canals Nose: Nares normal, mucosa normal, no drainage or sinus   tenderness Throat: Lips, mucosa, and tongue normal; teeth and gums normal Neck: Supple, no lymphadenopathy, thyroid:no  enlargement/tenderness/nodules; no carotid bruit or JVD Lungs: Clear to auscultation bilaterally without wheezes, rales or ronchi; respirations unlabored Heart: Regular rate and rhythm, S1 and S2 normal, no murmur, rub or gallop Abdomen: Soft, non-tender, nondistended, normoactive bowel sounds,  no hepatosplenomegaly.  Very small hernias noted, left greater than right. Extremities: No clubbing, cyanosis or edema Pulses: 2+ and symmetric all extremities Skin: Skin color, texture, turgor normal, minimal amounts of acne noted on the upper back area. Lymph nodes: Cervical, supraclavicular, and axillary nodes normal Neurologic: CNII-XII intact, normal strength, sensation and gait; reflexes 2+ and symmetric throughout   Psych: Normal mood, affect, hygiene and grooming  ASSESSMENT/PLAN: Mild intermittent extrinsic asthma without complication - Plan: levalbuterol (XOPENEX HFA) 45 MCG/ACT inhaler, CBC with Differential/Platelet, Comprehensive metabolic panel  Need for influenza vaccination - Plan: Flu Vaccine QUAD 36+ mos IM  Gastroesophageal reflux disease without esophagitis - Plan: CBC with Differential/Platelet, Comprehensive metabolic panel  Pancreatic insufficiency  History of pancreatitis  History of alcohol abuse  Acne, unspecified acne type  Screening for colon cancer - Plan: Cologuard  Screening for lipid disorders - Plan: Lipid panel He will continue on his present medications. I will try him on Xopenex to help with his breathing. Recommend Lever 2000 or Dial soap for his acne. Discussed treatment of the hernia and since it is very very small, no intervention really needed now.     recommended at least 30 minutes of aerobic activity at least 5 days/week; proper sunscreen use reviewed; healthy diet ; regular seatbelt use; changing batteries in smoke detectors. Immunization recommendations discussed.  Colonoscopy recommendations reviewed.   Medicare Attestation I have  personally reviewed: The patient's medical and social history Their use of alcohol, tobacco or illicit drugs Their current medications and supplements The patient's functional ability including ADLs,fall risks, home safety risks, cognitive, and hearing and visual impairment Diet and physical activities Evidence for depression or mood disorders  The patient's weight, height, and BMI have been recorded in the chart.  I have made referrals, counseling, and provided education to the patient based on review of the above and I have provided the patient with a written personalized care plan for preventive services.     Jill Alexanders, MD   09/22/2019

## 2019-09-22 NOTE — Patient Instructions (Signed)
  Joe Lawrence , Thank you for taking time to come for your Medicare Wellness Visit. I appreciate your ongoing commitment to your health goals. Please review the following plan we discussed and let me know if I can assist you in the future.   These are the goals we discussed: Use Lever 2000 or Dial soap to help with her acne   This is a list of the screening recommended for you and due dates:  Health Maintenance  Topic Date Due  . HIV Screening  01/27/1984  . Tetanus Vaccine  01/27/1988  . Flu Shot  07/31/2019  . Colon Cancer Screening  03/20/2027

## 2019-09-23 LAB — COMPREHENSIVE METABOLIC PANEL
ALT: 28 IU/L (ref 0–44)
AST: 18 IU/L (ref 0–40)
Albumin/Globulin Ratio: 2.2 (ref 1.2–2.2)
Albumin: 4.8 g/dL (ref 4.0–5.0)
Alkaline Phosphatase: 67 IU/L (ref 39–117)
BUN/Creatinine Ratio: 9 (ref 9–20)
BUN: 11 mg/dL (ref 6–24)
Bilirubin Total: 0.8 mg/dL (ref 0.0–1.2)
CO2: 28 mmol/L (ref 20–29)
Calcium: 10.5 mg/dL — ABNORMAL HIGH (ref 8.7–10.2)
Chloride: 101 mmol/L (ref 96–106)
Creatinine, Ser: 1.2 mg/dL (ref 0.76–1.27)
GFR calc Af Amer: 81 mL/min/{1.73_m2} (ref >59)
GFR calc non Af Amer: 70 mL/min/{1.73_m2} (ref >59)
Globulin, Total: 2.2 g/dL (ref 1.5–4.5)
Glucose: 91 mg/dL (ref 65–99)
Potassium: 4.4 mmol/L (ref 3.5–5.2)
Sodium: 141 mmol/L (ref 134–144)
Total Protein: 7 g/dL (ref 6.0–8.5)

## 2019-09-23 LAB — CBC WITH DIFFERENTIAL/PLATELET
Basophils Absolute: 0 10*3/uL (ref 0.0–0.2)
Basos: 0 %
EOS (ABSOLUTE): 0.1 10*3/uL (ref 0.0–0.4)
Eos: 1 %
Hematocrit: 48.4 % (ref 37.5–51.0)
Hemoglobin: 15.9 g/dL (ref 13.0–17.7)
Immature Grans (Abs): 0 10*3/uL (ref 0.0–0.1)
Immature Granulocytes: 0 %
Lymphocytes Absolute: 1.5 10*3/uL (ref 0.7–3.1)
Lymphs: 31 %
MCH: 31.1 pg (ref 26.6–33.0)
MCHC: 32.9 g/dL (ref 31.5–35.7)
MCV: 95 fL (ref 79–97)
Monocytes Absolute: 0.6 10*3/uL (ref 0.1–0.9)
Monocytes: 13 %
Neutrophils Absolute: 2.7 10*3/uL (ref 1.4–7.0)
Neutrophils: 55 %
Platelets: 307 10*3/uL (ref 150–450)
RBC: 5.11 x10E6/uL (ref 4.14–5.80)
RDW: 12.1 % (ref 11.6–15.4)
WBC: 4.9 10*3/uL (ref 3.4–10.8)

## 2019-09-23 LAB — LIPID PANEL
Chol/HDL Ratio: 3.5 ratio (ref 0.0–5.0)
Cholesterol, Total: 172 mg/dL (ref 100–199)
HDL: 49 mg/dL (ref >39)
LDL Chol Calc (NIH): 110 mg/dL — ABNORMAL HIGH (ref 0–99)
Triglycerides: 70 mg/dL (ref 0–149)
VLDL Cholesterol Cal: 13 mg/dL (ref 5–40)

## 2019-10-11 DIAGNOSIS — Z1211 Encounter for screening for malignant neoplasm of colon: Secondary | ICD-10-CM | POA: Diagnosis not present

## 2019-10-12 LAB — COLOGUARD: Cologuard: NEGATIVE

## 2019-10-18 NOTE — Progress Notes (Signed)
Pt was advised of negative cologuard. KH 

## 2019-11-11 ENCOUNTER — Other Ambulatory Visit: Payer: Self-pay | Admitting: Family Medicine

## 2019-11-11 DIAGNOSIS — J4530 Mild persistent asthma, uncomplicated: Secondary | ICD-10-CM

## 2019-11-28 ENCOUNTER — Other Ambulatory Visit: Payer: Self-pay | Admitting: Family Medicine

## 2019-11-28 DIAGNOSIS — K219 Gastro-esophageal reflux disease without esophagitis: Secondary | ICD-10-CM

## 2019-12-06 ENCOUNTER — Other Ambulatory Visit: Payer: Self-pay | Admitting: Family Medicine

## 2019-12-06 DIAGNOSIS — J4530 Mild persistent asthma, uncomplicated: Secondary | ICD-10-CM

## 2019-12-06 NOTE — Telephone Encounter (Signed)
[facility] is requesting to fill pt flovent. Please advise [facility]

## 2020-01-08 ENCOUNTER — Other Ambulatory Visit: Payer: Self-pay | Admitting: Family Medicine

## 2020-01-08 DIAGNOSIS — J4530 Mild persistent asthma, uncomplicated: Secondary | ICD-10-CM

## 2020-01-10 NOTE — Telephone Encounter (Signed)
[facility] is requesting to fill pt flovent. Please advise [facility]

## 2020-01-24 ENCOUNTER — Ambulatory Visit (INDEPENDENT_AMBULATORY_CARE_PROVIDER_SITE_OTHER): Payer: Medicare Other | Admitting: Family Medicine

## 2020-01-24 ENCOUNTER — Other Ambulatory Visit: Payer: Self-pay

## 2020-01-24 ENCOUNTER — Encounter: Payer: Self-pay | Admitting: Family Medicine

## 2020-01-24 VITALS — BP 148/97 | Temp 97.0°F | Wt 159.0 lb

## 2020-01-24 DIAGNOSIS — R519 Headache, unspecified: Secondary | ICD-10-CM | POA: Diagnosis not present

## 2020-01-24 DIAGNOSIS — R030 Elevated blood-pressure reading, without diagnosis of hypertension: Secondary | ICD-10-CM | POA: Diagnosis not present

## 2020-01-24 NOTE — Progress Notes (Signed)
   Subjective:    Patient ID: Joe Lawrence, male    DOB: [birth year], 49 y.o.   MRN: [id number]  HPI Documentation for virtual telephone encounter. Documentation for virtual audio and video telecommunications through [place] encounter: The patient was located at home. The provider was located in the office. The patient did consent to this visit and is aware of possible charges through their insurance for this visit. The other persons participating in this telemedicine service were none. This virtual service is not related to other E/M service within previous 7 days. He states that he had a left-sided headache associated with nausea last Tuesday in the left temporal area.  He describes it as sharp but no associated blurred vision, double vision, sweating, fever.  He has had 2 more of these types of headaches.  No sore throat, earache, nasal congestion.  He does not smoke or drink.  He is also been checking his blood pressure during this timeframe and notes pressures in the 148/100 range.  He has been checking it regularly and has been elevated.   Review of Systems     Objective:   Physical Exam Virtual visit so he appeared normal.       Assessment & Plan:  Elevated blood pressure reading  Nonintractable headache, unspecified chronicity pattern, unspecified headache type I explained the elevated blood pressure does need to be followed however you need to make sure that it is truly a sustained elevated blood pressure.  I explained he needs to check it in the sitting position and resting and weekly.  If it remains elevated for the next couple months, we will then treat it. I then discussed the headache and will have him treat this with ibuprofen 800 3 times daily for the next week and if continued difficulty, he will need to be seen.

## 2020-02-03 ENCOUNTER — Other Ambulatory Visit: Payer: Self-pay

## 2020-02-03 ENCOUNTER — Other Ambulatory Visit: Payer: Self-pay | Admitting: Family Medicine

## 2020-02-03 ENCOUNTER — Ambulatory Visit (INDEPENDENT_AMBULATORY_CARE_PROVIDER_SITE_OTHER): Payer: Medicare Other | Admitting: Family Medicine

## 2020-02-03 ENCOUNTER — Encounter: Payer: Self-pay | Admitting: Family Medicine

## 2020-02-03 VITALS — BP 146/94 | Temp 97.1°F | Wt 160.0 lb

## 2020-02-03 DIAGNOSIS — J4530 Mild persistent asthma, uncomplicated: Secondary | ICD-10-CM

## 2020-02-03 DIAGNOSIS — R030 Elevated blood-pressure reading, without diagnosis of hypertension: Secondary | ICD-10-CM | POA: Diagnosis not present

## 2020-02-03 DIAGNOSIS — J4520 Mild intermittent asthma, uncomplicated: Secondary | ICD-10-CM | POA: Diagnosis not present

## 2020-02-03 DIAGNOSIS — J0110 Acute frontal sinusitis, unspecified: Secondary | ICD-10-CM | POA: Diagnosis not present

## 2020-02-03 MED ORDER — AMOXICILLIN-POT CLAVULANATE 875-125 MG PO TABS: 1.0000 | ORAL_TABLET | Freq: Two times a day (BID) | ORAL | 0 refills | Status: DC

## 2020-02-03 NOTE — Telephone Encounter (Signed)
Joe Lawrence is requesting to fill pt albuterol. Please advise [facility]

## 2020-02-03 NOTE — Progress Notes (Signed)
   Subjective:    Patient ID: Joe Lawrence, male    DOB: 10-31-1971, 49 y.o.   MRN: [id number]  HPI Documentation for virtual telephone encounter.  Documentation for virtual audio and video telecommunications through [place] encounter:  The patient was located at home. The provider was located in the office. The patient did consent to this visit and is aware of possible charges through their insurance for this visit. The other persons participating in this telemedicine service were none. This virtual service is not related to other E/M service within previous 7 days. He continues to complain of difficulty with frontal type headache, left nasal pain and pressure with retro-orbital discomfort and nasal congestion.  Left side greater than right.  No PND, sore throat, earache, fever, chills, cough or smell or taste change.  He states that his asthma is getting slightly worse.    Review of Systems     Objective:   Physical Exam Alert and in no distress otherwise not examined       Assessment & Plan:  Acute non-recurrent frontal sinusitis - Plan: amoxicillin-clavulanate (AUGMENTIN) 875-125 MG tablet  Elevated blood pressure reading  Mild intermittent extrinsic asthma without complication I think it is reasonable to treat this like it is a sinus infection.  He is to call me at the end of the 10 days if not totally back to normal.  He will continue to treat his asthma as he has been in the past.  Recommend he keep track of his blood pressures on a weekly basis and we can address this again in several months.  Explained that if it still stays elevated we will need to treat this.  He was comfortable with that approach.  Approximately 15 minutes spent reviewing records and evaluating patient

## 2020-02-22 ENCOUNTER — Other Ambulatory Visit: Payer: Self-pay | Admitting: Family Medicine

## 2020-02-22 DIAGNOSIS — K219 Gastro-esophageal reflux disease without esophagitis: Secondary | ICD-10-CM

## 2020-03-09 ENCOUNTER — Other Ambulatory Visit: Payer: Self-pay | Admitting: Family Medicine

## 2020-03-09 DIAGNOSIS — J4530 Mild persistent asthma, uncomplicated: Secondary | ICD-10-CM

## 2020-03-09 NOTE — Telephone Encounter (Signed)
[facility] is requesting to fill pt flovent. Please advise [facility]

## 2020-03-23 ENCOUNTER — Ambulatory Visit: Payer: Medicare Other | Attending: Internal Medicine

## 2020-03-23 DIAGNOSIS — Z23 Encounter for immunization: Secondary | ICD-10-CM

## 2020-03-23 NOTE — Progress Notes (Signed)
   Covid-19 Vaccination Clinic  Name:  Joe Lawrence    MRN: [id number] DOB: 12-15-1971  03/23/2020  [name] was observed post Covid-19 immunization for 15 minutes without incident. He was provided with Vaccine Information Sheet and instruction to access the V-Safe system.   [name] was instructed to call 911 with any severe reactions post vaccine: Marland Kitchen Difficulty breathing  . Swelling of face and throat  . A fast heartbeat  . A bad rash all over body  . Dizziness and weakness   Immunizations Administered    Name Date Dose VIS Date Route   Pfizer COVID-19 Vaccine 03/23/2020  2:12 PM 0.3 mL 12/10/2019 Intramuscular   Manufacturer: [place]   Lot: [id number]   [place]: [id number]

## 2020-04-09 ENCOUNTER — Other Ambulatory Visit: Payer: Self-pay | Admitting: Family Medicine

## 2020-04-09 DIAGNOSIS — J4530 Mild persistent asthma, uncomplicated: Secondary | ICD-10-CM

## 2020-04-10 NOTE — Telephone Encounter (Signed)
[place] is requesting pt Inhaler Please advise [employer]

## 2020-04-17 ENCOUNTER — Ambulatory Visit: Payer: Medicare Other | Attending: Internal Medicine

## 2020-04-17 DIAGNOSIS — Z23 Encounter for immunization: Secondary | ICD-10-CM

## 2020-04-17 NOTE — Progress Notes (Signed)
   Covid-19 Vaccination Clinic  Name:  Joe Lawrence    MRN: [id number] DOB: 1971/08/07  04/17/2020  Joe Lawrence was observed post Covid-19 immunization for 15 minutes without incident. He was provided with Vaccine Information Sheet and instruction to access the V-Safe system.   Joe Lawrence was instructed to call 911 with any severe reactions post vaccine: Marland Kitchen Difficulty breathing  . Swelling of face and throat  . A fast heartbeat  . A bad rash all over body  . Dizziness and weakness   Immunizations Administered    Name Date Dose VIS Date Route   Pfizer COVID-19 Vaccine 04/17/2020  1:57 PM 0.3 mL 02/23/2019 Intramuscular   Manufacturer: [place]   Lot: [id number]   [place]: [id number]

## 2020-05-08 ENCOUNTER — Other Ambulatory Visit: Payer: Self-pay | Admitting: Family Medicine

## 2020-05-08 DIAGNOSIS — J4530 Mild persistent asthma, uncomplicated: Secondary | ICD-10-CM

## 2020-05-23 ENCOUNTER — Other Ambulatory Visit: Payer: Self-pay | Admitting: Family Medicine

## 2020-05-23 DIAGNOSIS — K219 Gastro-esophageal reflux disease without esophagitis: Secondary | ICD-10-CM

## 2020-06-05 ENCOUNTER — Other Ambulatory Visit: Payer: Self-pay | Admitting: Family Medicine

## 2020-06-05 DIAGNOSIS — J4530 Mild persistent asthma, uncomplicated: Secondary | ICD-10-CM

## 2020-06-05 NOTE — Telephone Encounter (Signed)
[facility] is requesting to fill pt flovent. Please advise [facility]

## 2020-08-27 ENCOUNTER — Other Ambulatory Visit: Payer: Self-pay | Admitting: Family Medicine

## 2020-08-27 DIAGNOSIS — K219 Gastro-esophageal reflux disease without esophagitis: Secondary | ICD-10-CM

## 2020-10-17 ENCOUNTER — Observation Stay (HOSPITAL_COMMUNITY)
Admission: EM | Admit: 2020-10-17 | Discharge: 2020-10-18 | Disposition: A | Discharge status: Home / Self Care | Payer: Medicare Other | Attending: Internal Medicine | Admitting: Internal Medicine

## 2020-10-17 ENCOUNTER — Encounter (HOSPITAL_COMMUNITY): Payer: Self-pay | Admitting: Emergency Medicine

## 2020-10-17 ENCOUNTER — Other Ambulatory Visit: Payer: Self-pay

## 2020-10-17 DIAGNOSIS — Z20822 Contact with and (suspected) exposure to covid-19: Secondary | ICD-10-CM | POA: Insufficient documentation

## 2020-10-17 DIAGNOSIS — Z87891 Personal history of nicotine dependence: Secondary | ICD-10-CM | POA: Diagnosis not present

## 2020-10-17 DIAGNOSIS — Z79899 Other long term (current) drug therapy: Secondary | ICD-10-CM | POA: Insufficient documentation

## 2020-10-17 DIAGNOSIS — R55 Syncope and collapse: Principal | ICD-10-CM | POA: Insufficient documentation

## 2020-10-17 DIAGNOSIS — F319 Bipolar disorder, unspecified: Secondary | ICD-10-CM | POA: Diagnosis not present

## 2020-10-17 DIAGNOSIS — J4530 Mild persistent asthma, uncomplicated: Secondary | ICD-10-CM | POA: Diagnosis present

## 2020-10-17 DIAGNOSIS — F429 Obsessive-compulsive disorder, unspecified: Secondary | ICD-10-CM | POA: Diagnosis present

## 2020-10-17 DIAGNOSIS — J4540 Moderate persistent asthma, uncomplicated: Secondary | ICD-10-CM | POA: Diagnosis not present

## 2020-10-17 DIAGNOSIS — R42 Dizziness and giddiness: Secondary | ICD-10-CM | POA: Diagnosis not present

## 2020-10-17 DIAGNOSIS — R457 State of emotional shock and stress, unspecified: Secondary | ICD-10-CM | POA: Diagnosis not present

## 2020-10-17 DIAGNOSIS — F418 Other specified anxiety disorders: Secondary | ICD-10-CM

## 2020-10-17 HISTORY — DX: Unspecified asthma, uncomplicated: J45.909

## 2020-10-17 HISTORY — DX: Depression, unspecified: F32.A

## 2020-10-17 HISTORY — DX: Gastro-esophageal reflux disease without esophagitis: K21.9

## 2020-10-17 LAB — BASIC METABOLIC PANEL
Anion gap: 9 (ref 5–15)
BUN: 11 mg/dL (ref 6–20)
CO2: 27 mmol/L (ref 22–32)
Calcium: 9.5 mg/dL (ref 8.9–10.3)
Chloride: 100 mmol/L (ref 98–111)
Creatinine, Ser: 1.03 mg/dL (ref 0.61–1.24)
GFR, Estimated: >60 mL/min (ref >60)
Glucose, Bld: 137 mg/dL — ABNORMAL HIGH (ref 70–99)
Potassium: 3.6 mmol/L (ref 3.5–5.1)
Sodium: 136 mmol/L (ref 135–145)

## 2020-10-17 LAB — URINALYSIS, ROUTINE W REFLEX MICROSCOPIC
Bilirubin Urine: NEGATIVE
Glucose, UA: NEGATIVE mg/dL
Hgb urine dipstick: NEGATIVE
Ketones, ur: NEGATIVE mg/dL
Leukocytes,Ua: NEGATIVE
Nitrite: NEGATIVE
Protein, ur: NEGATIVE mg/dL
Specific Gravity, Urine: 1.014 (ref 1.005–1.030)
pH: 7 (ref 5.0–8.0)

## 2020-10-17 LAB — CBC
HCT: 45.8 % (ref 39.0–52.0)
Hemoglobin: 14.4 g/dL (ref 13.0–17.0)
MCH: 29.1 pg (ref 26.0–34.0)
MCHC: 31.4 g/dL (ref 30.0–36.0)
MCV: 92.7 fL (ref 80.0–100.0)
Platelets: 233 10*3/uL (ref 150–400)
RBC: 4.94 MIL/uL (ref 4.22–5.81)
RDW: 12.5 % (ref 11.5–15.5)
WBC: 3.8 10*3/uL — ABNORMAL LOW (ref 4.0–10.5)
nRBC: 0 % (ref 0.0–0.2)

## 2020-10-17 LAB — CBG MONITORING, ED: Glucose-Capillary: 94 mg/dL (ref 70–99)

## 2020-10-17 MED ORDER — SODIUM CHLORIDE 0.9 % IV BOLUS
500.0000 mL | Freq: Once | INTRAVENOUS | Status: AC
  Administered 2020-10-18: 500 mL via INTRAVENOUS

## 2020-10-17 NOTE — ED Triage Notes (Addendum)
Patient arrives to ED triage via [facility] with complaints of loss of consciousness this morning. Per pt he started taking CBD oil yesterday and took 75mg  yesterday and 50mg  today. Pt states he suddenly got diaphoretic, dizzy, anxious, and short of breath shortly after the CBD consumption. Per EMS pts mother saw pt pass out" and started chest compressions on him, it was witnessed but never checked a pulse and patient was "out" for less than 30 seconds. Pt states he feels "high" because his mouth is dry and he's very anxious/nervous. States hx of panic attacks when he used to smoke weed.

## 2020-10-17 NOTE — ED Provider Notes (Addendum)
[facility] EMERGENCY DEPARTMENT Provider Note   CSN: [id number] Arrival date & time: 10/17/20  1146     History Chief Complaint  Patient presents with  . Loss of Consciousness    Joe Lawrence is a 49 y.o. male.  The history is provided by the patient.  Loss of Consciousness Episode history:  Single Most recent episode:  Today Timing:  Constant Progression:  Resolved Chronicity:  New Context: medication change   Context comment:  Started CBD oil for anxiety took last night and then this am  Witnessed: no   Relieved by:  Nothing Worsened by:  Nothing Ineffective treatments:  None tried Associated symptoms: no anxiety, no chest pain, no confusion, no diaphoresis, no difficulty breathing, no dizziness, no fever, no focal sensory loss, no focal weakness, no headaches, no malaise/fatigue, no nausea, no palpitations, no recent fall, no recent injury, no recent surgery, no rectal bleeding, no seizures, no shortness of breath, no visual change, no vomiting and no weakness   Risk factors: no congenital heart disease   Patient with Bipolar who is off all prescribed meds started taking CBD oil last night and was fine with first dose.  Then took this am and drank coffee. Reports feeling clammy and going to mother's room and then became unresponsive and was reportedly apneic.  Mother did CPR on patient and patient began to respond.       Past Medical History:  Diagnosis Date  . Anxiety   . Bipolar disorder ([place])   . Bronchitis   . Chronic back pain   . Chronic pancreatitis ([place])   . Hx of suicide attempt    x 3  . Pancreatitis   . PPD positive   . Tobacco abuse     Patient Active Problem List   Diagnosis Date Noted  . Mild persistent asthma without complication 10/62/6948  . Gastroesophageal reflux disease without esophagitis 04/08/2017  . Chronic seasonal allergic rhinitis due to pollen 04/08/2017  . Pancreatic insufficiency 02/03/2017  . Opioid  dependence ([place]) 11/13/2012  . History of pancreatitis 07/20/2012  . Back pain 11/25/2011  . Obsessive-compulsive disorder 09/07/2007  . History of alcohol abuse 09/07/2007    Past Surgical History:  Procedure Laterality Date  . BACK SURGERY     internal nerve stimulator placement and removal  . Epidural Steroid Injection  02/23/2011  . Right Arm Surgery         Family History  Problem Relation Age of Onset  . Alcohol abuse Brother     Social History   Tobacco Use  . Smoking status: Former Smoker    Packs/day: 1.00    Years: 2.00    Pack years: 2.00    Types: Cigarettes  . Smokeless tobacco: Never Used  Substance Use Topics  . Alcohol use: No    Comment: equivalent to x24 beers daily  . Drug use: No    Comment: heroin, crack    Home Medications Prior to Admission medications   Medication Sig Start Date End Date Taking? Authorizing Provider  amoxicillin-clavulanate (AUGMENTIN) 875-125 MG tablet Take 1 tablet by mouth 2 (two) times daily. 02/03/20   Denita Lung, MD  Digestive Enzymes (DIGESTIVE ENZYME PO) Take by mouth.    [provider]  FLOVENT HFA 220 MCG/ACT inhaler USE 2 PUFFS TWICE DAILY. 06/05/20   Denita Lung, MD  levalbuterol [facility] HFA) 45 MCG/ACT inhaler Inhale 2 puffs into the lungs every 6 (six) hours as needed for wheezing. 09/22/19  Denita Lung, MD  Pancrelipase, Lip-Prot-Amyl, (CREON) 24000-76000 units CPEP TAKE 1 OR 2 CAPSULES WITH EVERY MEAL. Patient not taking: Reported on 09/03/2018 04/08/17   Denita Lung, MD  pantoprazole (PROTONIX) 40 MG tablet TAKE 1 TABLET BY MOUTH TWICE DAILY. 08/28/20   Denita Lung, MD  VENTOLIN HFA 108 [phone] Base) MCG/ACT inhaler INHALE 2 PUFFS EVERY 6 HOURS AS NEEDED FOR WHEEZING OR SHORTNESS OF BREATH. 08/11/19   Denita Lung, MD    Allergies    Celexa [citalopram], Chlorpromazine hcl, Iodine, Sulfonamide derivatives, and Voltaren [diclofenac sodium]  Review of Systems   Review of Systems   Constitutional: Negative for diaphoresis, fever and malaise/fatigue.  HENT: Negative for congestion.   Eyes: Negative for visual disturbance.  Respiratory: Negative for shortness of breath.   Cardiovascular: Positive for syncope. Negative for chest pain and palpitations.  Gastrointestinal: Negative for nausea and vomiting.  Genitourinary: Negative for difficulty urinating.  Musculoskeletal: Negative for arthralgias.  Neurological: Negative for dizziness, focal weakness, seizures, weakness and headaches.  Psychiatric/Behavioral: Negative for confusion.  All other systems reviewed and are negative.   Physical Exam Updated Vital Signs BP (!) 144/91 (BP Location: Right Arm)   Pulse 70   Temp 98.2 F (36.8 C) (Oral)   Resp 15   Ht 5\' 10"  (1.778 m)   Wt 68 kg   SpO2 98%   BMI 21.52 kg/m   Physical Exam Vitals and nursing note reviewed.  Constitutional:      General: He is not in acute distress.    Appearance: Normal appearance.  HENT:     Head: Normocephalic and atraumatic.     Nose: Nose normal.  Eyes:     Conjunctiva/sclera: Conjunctivae normal.     Pupils: Pupils are equal, round, and reactive to light.  Cardiovascular:     Rate and Rhythm: Normal rate and regular rhythm.     Pulses: Normal pulses.     Heart sounds: Normal heart sounds.  Pulmonary:     Effort: Pulmonary effort is normal.     Breath sounds: Normal breath sounds.  Abdominal:     General: Abdomen is flat. Bowel sounds are normal.     Palpations: Abdomen is soft.     Tenderness: There is no abdominal tenderness. There is no guarding or rebound.  Musculoskeletal:        General: Normal range of motion.     Cervical back: Normal range of motion and neck supple.  Skin:    General: Skin is warm and dry.     Capillary Refill: Capillary refill takes less than 2 seconds.  Neurological:     General: No focal deficit present.     Mental Status: He is alert and oriented to person, place, and time.     Deep  Tendon Reflexes: Reflexes normal.  Psychiatric:        Mood and Affect: Mood is anxious.     ED Results / Procedures / Treatments   Labs (all labs ordered are listed, but only abnormal results are displayed) Results for orders placed or performed during the hospital encounter of 95/63/87  Basic metabolic panel  Result Value Ref Range   Sodium 136 135 - 145 mmol/L   Potassium 3.6 3.5 - 5.1 mmol/L   Chloride 100 98 - 111 mmol/L   CO2 27 22 - 32 mmol/L   Glucose, Bld 137 (H) 70 - 99 mg/dL   BUN 11 6 - 20 mg/dL   Creatinine, Ser 1.03  0.61 - 1.24 mg/dL   Calcium 9.5 8.9 - 10.3 mg/dL   GFR, Estimated >60 >60 mL/min   Anion gap 9 5 - 15  CBC  Result Value Ref Range   WBC 3.8 (L) 4.0 - 10.5 K/uL   RBC 4.94 4.22 - 5.81 MIL/uL   Hemoglobin 14.4 13.0 - 17.0 g/dL   HCT 45.8 39 - 52 %   MCV 92.7 80.0 - 100.0 fL   MCH 29.1 26.0 - 34.0 pg   MCHC 31.4 30.0 - 36.0 g/dL   RDW 12.5 11.5 - 15.5 %   Platelets 233 150 - 400 K/uL   nRBC 0.0 0.0 - 0.2 %  Urinalysis, Routine w reflex microscopic Urine, Clean Catch  Result Value Ref Range   Color, Urine YELLOW YELLOW   APPearance CLEAR CLEAR   Specific Gravity, Urine 1.014 1.005 - 1.030   pH 7.0 5.0 - 8.0   Glucose, UA NEGATIVE NEGATIVE mg/dL   Hgb urine dipstick NEGATIVE NEGATIVE   Bilirubin Urine NEGATIVE NEGATIVE   Ketones, ur NEGATIVE NEGATIVE mg/dL   Protein, ur NEGATIVE NEGATIVE mg/dL   Nitrite NEGATIVE NEGATIVE   Leukocytes,Ua NEGATIVE NEGATIVE  CBG monitoring, ED  Result Value Ref Range   Glucose-Capillary 94 70 - 99 mg/dL   No results found.  EKG EKG Interpretation  Date/Time:  Tuesday October 17 2020 11:48:46 EDT Ventricular Rate:  72 PR Interval:  176 QRS Duration: 104 QT Interval:  414 QTC Calculation: 453 R Axis:   55 Text Interpretation: Normal sinus rhythm Incomplete right bundle branch block Confirmed by Claudean Leavelle ([id number]) on 10/17/2020 11:26:45 PM   Radiology No results found.  Procedures Procedures  (including critical care time)  Medications Ordered in ED Medications  sodium chloride 0.9 % bolus 500 mL (has no administration in time range)    ED Course  I have reviewed the triage vital signs and the nursing notes.  Pertinent labs & imaging results that were available during my care of the patient were reviewed by me and considered in my medical decision making (see chart for details).    Final Clinical Impression(s) / ED Diagnoses   Given CPR with reported apnea and CPR will admit for observation.        Shamirah Ivan, MD 10/18/20 2130

## 2020-10-18 ENCOUNTER — Encounter (HOSPITAL_COMMUNITY): Payer: Self-pay | Admitting: Emergency Medicine

## 2020-10-18 ENCOUNTER — Emergency Department (HOSPITAL_BASED_OUTPATIENT_CLINIC_OR_DEPARTMENT_OTHER): Payer: Medicare Other

## 2020-10-18 ENCOUNTER — Emergency Department (HOSPITAL_COMMUNITY): Payer: Medicare Other

## 2020-10-18 DIAGNOSIS — R55 Syncope and collapse: Secondary | ICD-10-CM

## 2020-10-18 DIAGNOSIS — F418 Other specified anxiety disorders: Secondary | ICD-10-CM

## 2020-10-18 DIAGNOSIS — R42 Dizziness and giddiness: Secondary | ICD-10-CM | POA: Diagnosis not present

## 2020-10-18 LAB — BASIC METABOLIC PANEL
Anion gap: 7 (ref 5–15)
BUN: 8 mg/dL (ref 6–20)
CO2: 25 mmol/L (ref 22–32)
Calcium: 9.1 mg/dL (ref 8.9–10.3)
Chloride: 106 mmol/L (ref 98–111)
Creatinine, Ser: 0.96 mg/dL (ref 0.61–1.24)
GFR, Estimated: >60 mL/min (ref >60)
Glucose, Bld: 144 mg/dL — ABNORMAL HIGH (ref 70–99)
Potassium: 3.8 mmol/L (ref 3.5–5.1)
Sodium: 138 mmol/L (ref 135–145)

## 2020-10-18 LAB — RAPID URINE DRUG SCREEN, HOSP PERFORMED
Amphetamines: NOT DETECTED
Barbiturates: NOT DETECTED
Benzodiazepines: NOT DETECTED
Cocaine: NOT DETECTED
Opiates: NOT DETECTED
Tetrahydrocannabinol: POSITIVE — AB

## 2020-10-18 LAB — ECHOCARDIOGRAM COMPLETE
Area-P 1/2: 3.48 cm2
Calc EF: 59.4 %
Height: 70 in
S' Lateral: 3.8 cm
Single Plane A2C EF: 55.8 %
Single Plane A4C EF: 60.1 %
Weight: 2400.02 oz

## 2020-10-18 LAB — RESPIRATORY PANEL BY RT PCR (FLU A&B, COVID)
Influenza A by PCR: NEGATIVE
Influenza B by PCR: NEGATIVE
SARS Coronavirus 2 by RT PCR: NEGATIVE

## 2020-10-18 LAB — CBC
HCT: 39.4 % (ref 39.0–52.0)
Hemoglobin: 12.6 g/dL — ABNORMAL LOW (ref 13.0–17.0)
MCH: 29.5 pg (ref 26.0–34.0)
MCHC: 32 g/dL (ref 30.0–36.0)
MCV: 92.3 fL (ref 80.0–100.0)
Platelets: 204 10*3/uL (ref 150–400)
RBC: 4.27 MIL/uL (ref 4.22–5.81)
RDW: 12.6 % (ref 11.5–15.5)
WBC: 3.8 10*3/uL — ABNORMAL LOW (ref 4.0–10.5)
nRBC: 0 % (ref 0.0–0.2)

## 2020-10-18 LAB — HIV ANTIBODY (ROUTINE TESTING W REFLEX): HIV Screen 4th Generation wRfx: NONREACTIVE

## 2020-10-18 LAB — TROPONIN I (HIGH SENSITIVITY)
Troponin I (High Sensitivity): 3 ng/L (ref <18)
Troponin I (High Sensitivity): 4 ng/L (ref <18)

## 2020-10-18 MED ORDER — POLYETHYLENE GLYCOL 3350 17 G PO PACK
17.0000 g | PACK | Freq: Every day | ORAL | Status: DC | PRN
  Administered 2020-10-18: 17 g via ORAL
  Filled 2020-10-18: qty 1

## 2020-10-18 MED ORDER — ACETAMINOPHEN 325 MG PO TABS: 650.0000 mg | ORAL_TABLET | Freq: Four times a day (QID) | ORAL | Status: DC | PRN

## 2020-10-18 MED ORDER — ESCITALOPRAM OXALATE 5 MG PO TABS: 5.0000 mg | ORAL_TABLET | Freq: Every day | ORAL | 0 refills | Status: DC

## 2020-10-18 MED ORDER — LACTATED RINGERS IV BOLUS
1000.0000 mL | Freq: Once | INTRAVENOUS | Status: AC
  Administered 2020-10-18: 1000 mL via INTRAVENOUS

## 2020-10-18 MED ORDER — ESCITALOPRAM OXALATE 10 MG PO TABS: 5.0000 mg | ORAL_TABLET | Freq: Every day | ORAL | Status: DC

## 2020-10-18 MED ORDER — ALBUTEROL SULFATE HFA 108 (90 BASE) MCG/ACT IN AERS
2.0000 | INHALATION_SPRAY | RESPIRATORY_TRACT | Status: DC | PRN
  Filled 2020-10-18: qty 6.7

## 2020-10-18 MED ORDER — PANTOPRAZOLE SODIUM 40 MG PO TBEC
40.0000 mg | DELAYED_RELEASE_TABLET | Freq: Two times a day (BID) | ORAL | Status: DC
  Administered 2020-10-18: 40 mg via ORAL
  Filled 2020-10-18: qty 1

## 2020-10-18 MED ORDER — ACETAMINOPHEN 650 MG RE SUPP: 650.0000 mg | Freq: Four times a day (QID) | RECTAL | Status: DC | PRN

## 2020-10-18 MED ORDER — BUDESONIDE 0.5 MG/2ML IN SUSP
1.0000 mg | Freq: Two times a day (BID) | RESPIRATORY_TRACT | Status: DC
  Filled 2020-10-18 (×3): qty 4

## 2020-10-18 MED ORDER — DOCUSATE SODIUM 100 MG PO CAPS
100.0000 mg | ORAL_CAPSULE | Freq: Every day | ORAL | Status: DC
  Administered 2020-10-18: 100 mg via ORAL
  Filled 2020-10-18: qty 1

## 2020-10-18 MED ORDER — PANCRELIPASE (LIP-PROT-AMYL) 12000-38000 UNITS PO CPEP
24000.0000 [IU] | ORAL_CAPSULE | Freq: Three times a day (TID) | ORAL | Status: DC
  Administered 2020-10-18 (×2): 24000 [IU] via ORAL
  Filled 2020-10-18 (×4): qty 2

## 2020-10-18 MED ORDER — FLUTICASONE PROPIONATE HFA 220 MCG/ACT IN AERO: 2.0000 | INHALATION_SPRAY | Freq: Two times a day (BID) | RESPIRATORY_TRACT | Status: DC

## 2020-10-18 NOTE — H&P (Signed)
History and Physical    Joe Lawrence SWH:[id number] DOB: Mar 16, 1971 DOA: 10/17/2020  PCP: Denita Lung, MD   Patient coming from:   Home  Chief Complaint:   Syncope  HPI: Joe Lawrence is a 49 y.o. male with medical history significant for OCD/anxiety/depression, asthma who presents by EMS after a syncopal event at home.  He states that he used to be on therapy with Celexa and Seroquel which he took for 8 to 10 years taken off of it approximately 4 years ago.  He reports has been having worsening depression and anxiety over the last few years which increased in the last few weeks.  He decided he would try CBD oil for his anxiety as he has been struggling with anxiety more.  Reports that he researched CBD on the Internet and ordered a bottle.  He states that he took 25 mg CBD oil on Monday night and took 50 mg of CBD oil on Tuesday morning when he woke up.  He states that he slept well.  He reports that approximate hour after having coffee and taking his CBD oil on Tuesday morning he started to feel dizzy and clammy.  He went to awaken his mother who had an was feeling and when he got to her doorway he had a syncopal event.  Mother reports that he did not have any seizure activity and had no bowel or bladder function loss.  She states she did not check her pulse and called 911 and then started doing CPR which she described as "pushing lightly on his chest".  He was awake and alert when EMS arrived and had no neurological symptoms.  He was brought to the emergency room for evaluation.  States he did not have any chest pain or palpitations prior to the event.  He reports no focal weakness, numbness of extremities, visual change, slurred speech or difficulty swallowing.  He reports that now he feels anxious but otherwise normal  Review of Systems:  General: Denies weakness, fever, chills, weight loss, night sweats.  Denies dizziness.  Denies change in appetite HENT: Denies head trauma,  headache, denies change in hearing, tinnitus.  Denies nasal congestion or bleeding.  Denies sore throat, sores in mouth.  Denies difficulty swallowing Eyes: Denies blurry vision, pain in eye, drainage.  Denies discoloration of eyes. Neck: Denies pain.  Denies swelling.  Denies pain with movement. Cardiovascular: Denies chest pain, palpitations.  Denies edema.  Denies orthopnea Respiratory: Denies shortness of breath, cough.  Denies wheezing.  Denies sputum production Gastrointestinal: Denies abdominal pain, swelling.  Denies nausea, vomiting, diarrhea.  Denies melena.  Denies hematemesis. Musculoskeletal: Denies limitation of movement.  Denies deformity or swelling.  Denies pain.  Denies arthralgias or myalgias. Genitourinary: Denies pelvic pain.  Denies urinary frequency or hesitancy.  Denies dysuria.  Skin: Denies rash.  Denies petechiae, purpura, ecchymosis. Neurological: Denies headache.  Denies seizure activity.  Denies weakness or paresthesia.  Denies slurred speech, drooping face.  Denies visual change. Psychiatric: Reports depression, anxiety.  Reports OCD symptoms of frequent handwashing and worrying.  Denies suicidal thoughts or ideation.  Denies hallucinations.  Past Medical History:  Diagnosis Date  . Anxiety   . Asthma   . Bipolar disorder ([place])   . Bronchitis   . Chronic back pain   . Chronic pancreatitis ([place])   . Depression   . GERD (gastroesophageal reflux disease)   . Hx of suicide attempt    x 3  . Pancreatitis   . PPD  positive   . Tobacco abuse     Past Surgical History:  Procedure Laterality Date  . BACK SURGERY     internal nerve stimulator placement and removal  . Epidural Steroid Injection  02/23/2011  . Right Arm Surgery      Social History  reports that he has quit smoking. His smoking use included cigarettes. He has a 2.00 pack-year smoking history. He has never used smokeless tobacco. He reports that he does not drink alcohol and does not use  drugs.  Allergies  Allergen Reactions  . Celexa [Citalopram]     Muscle tighness  . Chlorpromazine Hcl Other (See Comments)    Pt does not remember this allergy.  May be 20 years ago.  . Iodine     Skin has burning sensation.  . Sulfonamide Derivatives     Patient does not know of this allergy or the reaction.  . Voltaren [Diclofenac Sodium]     voltaren has caused his arm to swell in the past, does not remember when this happened.    Family History  Problem Relation Age of Onset  . Alcohol abuse Brother      Prior to Admission medications   Medication Sig Start Date End Date Taking? Authorizing Provider  FLOVENT HFA 220 MCG/ACT inhaler USE 2 PUFFS TWICE DAILY. 06/05/20  Yes Denita Lung, MD  Pancrelipase, Lip-Prot-Amyl, (CREON) 24000-76000 units CPEP TAKE 1 OR 2 CAPSULES WITH EVERY MEAL. 04/08/17  Yes Denita Lung, MD  pantoprazole (PROTONIX) 40 MG tablet TAKE 1 TABLET BY MOUTH TWICE DAILY. 08/28/20  Yes Denita Lung, MD  VENTOLIN HFA 108 (90 Base) MCG/ACT inhaler INHALE 2 PUFFS EVERY 6 HOURS AS NEEDED FOR WHEEZING OR SHORTNESS OF BREATH. 08/11/19  Yes Denita Lung, MD  amoxicillin-clavulanate (AUGMENTIN) 875-125 MG tablet Take 1 tablet by mouth 2 (two) times daily. Patient not taking: Reported on 10/18/2020 02/03/20   Denita Lung, MD  levalbuterol [facility] HFA) 45 MCG/ACT inhaler Inhale 2 puffs into the lungs every 6 (six) hours as needed for wheezing. Patient not taking: Reported on 10/18/2020 09/22/19   Denita Lung, MD    Physical Exam: Vitals:   10/18/20 0030 10/18/20 0100 10/18/20 0115 10/18/20 0148  BP: (!) 139/111 (!) 144/99 (!) 141/85   Pulse: 65 68 60   Resp: 13 20 16    Temp:      TempSrc:      SpO2: 98% 98% 97%   Weight:    68 kg  Height:    5\' 10"  (1.778 m)    Constitutional: NAD, calm, comfortable Vitals:   10/18/20 0030 10/18/20 0100 10/18/20 0115 10/18/20 0148  BP: (!) 139/111 (!) 144/99 (!) 141/85   Pulse: 65 68 60   Resp: 13 20 16     Temp:      TempSrc:      SpO2: 98% 98% 97%   Weight:    68 kg  Height:    5\' 10"  (1.778 m)   General: WDWN, Alert and oriented x3.  Eyes: EOMI, PERRL, lids and conjunctivae normal.  Sclera nonicteric HENT:  [place]/AT, external ears normal.  Nares patent without epistasis.  Mucous membranes are moist. Posterior pharynx clear of any exudate or lesions. Normal dentition.  Neck: Soft, normal range of motion, supple, no masses, no thyromegaly.  Trachea midline Respiratory: clear to auscultation bilaterally, no wheezing, no crackles. Normal respiratory effort. No accessory muscle use.  Cardiovascular: Regular rate and rhythm, no murmurs / rubs / gallops.  No extremity edema. 2+ pedal pulses.  Abdomen: Soft, no tenderness, nondistended, no rebound or guarding.  No masses palpated. Bowel sounds normoactive Musculoskeletal: FROM. no clubbing / cyanosis. No joint deformity upper and lower extremities. Normal muscle tone.  Skin: Warm, dry, intact no rashes, lesions, ulcers. No induration Neurologic: CN 2-12 grossly intact.  Normal speech.  Sensation intact, patella DTR +1 bilaterally. Strength 5/5 in all extremities.   Psychiatric: Normal judgment and insight.  Normal mood.    Labs on Admission: I have personally reviewed following labs and imaging studies  CBC: Recent Labs  Lab 10/17/20 1159  WBC 3.8*  HGB 14.4  HCT 45.8  MCV 92.7  PLT 740    Basic Metabolic Panel: Recent Labs  Lab 10/17/20 1159  NA 136  K 3.6  CL 100  CO2 27  GLUCOSE 137*  BUN 11  CREATININE 1.03  CALCIUM 9.5    GFR: Estimated Creatinine Clearance: 81.6 mL/min (by C-G formula based on SCr of 1.03 mg/dL).  Liver Function Tests: No results for input(s): AST, ALT, ALKPHOS, BILITOT, PROT, ALBUMIN in the last 168 hours.  Urine analysis:    Component Value Date/Time   COLORURINE YELLOW 10/17/2020 1949   APPEARANCEUR CLEAR 10/17/2020 1949   LABSPEC 1.014 10/17/2020 1949   PHURINE 7.0 10/17/2020 1949   GLUCOSEU  NEGATIVE 10/17/2020 1949   HGBUR NEGATIVE 10/17/2020 1949   BILIRUBINUR NEGATIVE 10/17/2020 1949   KETONESUR NEGATIVE 10/17/2020 1949   PROTEINUR NEGATIVE 10/17/2020 1949   UROBILINOGEN 0.2 09/26/2012 1830   NITRITE NEGATIVE 10/17/2020 1949   LEUKOCYTESUR NEGATIVE 10/17/2020 1949    Radiological Exams on Admission: CT Head Wo Contrast  Result Date: 10/18/2020 CLINICAL DATA:  Syncope. Loss of consciousness, diaphoresis, dizziness, anxiety, and shortness of breath after CBD consumption. EXAM: CT HEAD WITHOUT CONTRAST TECHNIQUE: Contiguous axial images were obtained from the base of the skull through the vertex without intravenous contrast. COMPARISON:  06/13/2005 FINDINGS: Brain: No evidence of acute infarction, hemorrhage, hydrocephalus, extra-axial collection or mass lesion/mass effect. Vascular: No hyperdense vessel or unexpected calcification. Skull: Normal. Negative for fracture or focal lesion. Sinuses/Orbits: Paranasal sinuses and mastoid air cells are clear. Other: None. IMPRESSION: No acute intracranial abnormalities. Electronically Signed   By: Lucienne Capers M.D.   On: 10/18/2020 00:46   DG Chest Portable 1 View  Result Date: 10/18/2020 CLINICAL DATA:  Syncope. EXAM: PORTABLE CHEST 1 VIEW COMPARISON:  September 26, 2012 FINDINGS: The heart size and mediastinal contours are within normal limits. Both lungs are clear. The visualized skeletal structures are unremarkable. IMPRESSION: No active disease. Electronically Signed   By: Virgina Norfolk M.D.   On: 10/18/2020 00:19    EKG: Independently reviewed.  EKG is reviewed and shows normal sinus rhythm with no acute ST elevation or depression.  Incomplete right bundle branch block is noted.  QTc is 453  Assessment/Plan      Syncope Mr. Cates with syncopal event when at home yesterday. No seizure activity noted.  Patient was very anxious and thought he was panicking and hyperventilating at the time of the attack.  Reportedly his  mother did CPR on him but she states that she did not check for his pulse that she lightly pressed on his chest until EMS arrived.  Patient had no seizure activity or loss of bowel or bladder function.  Patient had no postictal confusion when he came to.  He has had no neurological deficits since being in the emergency room. Patient is given LR bolus in  the emergency room now. Check orthostatic blood pressures in the morning     OCD/anxiety/depression Patient has been on medications in the past.  He states he last used Seroquel and Celexa for years ago.  He was on those medications for 8 years.  He reports he has been feeling more anxious lately and having thoughts that life would be better or others if he was not in it he has no suicidal or homicidal thoughts or plans. He states he has used Lexapro in the past and would like to resume therapy with Lexapro.  He also reports he will follow-up with psycholgist or psychiatrist follow-up as an outpatient     Asthma Continue Flovent twice a day and as needed albuterol MDI with spacer.  DVT prophylaxis: [place] score low.  DVT prophylaxis with TED hose and ambulation.  Ambulate with assistance Code Status:   Full code Family Communication:  Diagnosis and plan discussed with patient and his mother who is at bedside.  They agree with plan.  Questions were answered.  Further recommendation follow as clinically indicated Disposition Plan:   Patient is from:  Home  Anticipated DC to:  Home  Anticipated DC date:  Anticipate less than 2 midnight stay  Anticipated DC barriers: No barriers to discharge identified   Admission status:  Observation   Eben Burow MD [facility]  How to contact the [facility] Attending or Consulting provider [place] or covering provider during after hours [place], for this patient?   1. Check the care team in [facility] and look for a) attending/consulting [facility] provider listed and b) the [facility] team listed 2. Log into  [email or web address] and use [place]'s universal password to access. If you do not have the password, please contact the hospital operator. 3. Locate the [facility] provider you are looking for under [facility] and page to a number that you can be directly reached. 4. If you still have difficulty reaching the provider, please page the [facility] (Director on Call) for the Hospitalists listed on amion for assistance.  10/18/2020, 2:40 AM

## 2020-10-18 NOTE — Progress Notes (Signed)
  Echocardiogram 2D Echocardiogram has been performed.  Joe Lawrence 10/18/2020, 11:55 AM

## 2020-10-18 NOTE — ED Notes (Signed)
Pt ambulatory to restroom x2 denies feeling lightheaded or dizzy  Steady even gait.  States he feels constipated but is not having successful BM

## 2020-10-18 NOTE — Discharge Summary (Signed)
Physician Discharge Summary  Joe Lawrence UGQ:[id number] DOB: 12-14-1971 DOA: 10/17/2020  PCP: Denita Lung, MD  Admit date: 10/17/2020 Discharge date: 10/18/2020  Admitted From: home Disposition:  home  Recommendations for Outpatient Follow-up:  1. Follow up with PCP in 1-2 weeks 2. Please obtain BMP/CBC in one week 3. Please follow up on the following pending results:  Home Health:no  Equipment/Devices: none  Discharge Condition: Stable Code Status:   Code Status: Full Code Diet recommendation:  Diet Order            Diet regular Room service appropriate? Yes; Fluid consistency: Thin  Diet effective now           Diet - low sodium heart healthy                  Brief/Interim Summary: 49 year old man with history of CAD/anxiety/depression, asthma seen in the ED for evaluation of syncope. He has been irritated and depressed for the last few weeks.  He used to be on Celexa and Seroquel for 8 to 10 years but taken off about 4 years ago. He read online abt CBD oil and ordered online and started using it recently, had CBD in am and had coffee and 30 mins later went to  kitchen, his hand got wet and tingly, feeling funny, and went to mother's room and told her the CBD was adversely affecting him. Then he does not remember what happened. When he woke up he did recognize his mother, but was confused about why he was on the floor and his mother told him he passed out. He denied bowel bladder incontinence, no tongue bite, no chest pain or lightneadeness.  As per report mother did some chest compressions but did not check pulse and on EMS arrival patient was awake, no focal weakness no numbness tingling. Patient was seen in this ED underwent extensive evaluation negative CT head chest x-ray, fairly stable CBC BMP, negative troponin, HIV screen nonreactive, urine drug screen positive for THC, UA unremarkable.  Influenza and COVID-19 negative.  Blood sugar stable Patient was admitted  for observation. Monitor OV in ED and he was asymptomatic and has been ambulating. OV shows it is going up on standing 130 to 160 no hypotensive or orthostasis noted.he had constipation in ED. TTE obtained for completeness.  Patient is ambulatory and feels well okay for discharge home today after echo comes back normal.   Discharge Diagnoses:  Principal Problem:   Syncope: Likely in the setting of panic episode versus vasovagal episode.  Question from [facility] oil which he has taken prior to the episode.  Work-up unremarkable  CT head routine labs are stable. Echo normal no acute findings.  Obsessive-compulsive disorder/ Depression with anxiety: Appears somewhat anxious no suicidal ideation.  He will follow up with [place] outpatient psychiatrist. Wanting to resume lexapro. Mild persistent asthma without complication-continue his home inhaler Constipation -continue as needed stress overnight home  Consults:  none  Subjective: Alert, awake, oriented. No dizziness on ambulation. No chest pain.  Feels well.  Has been ambulatory.  Discharge Exam: Vitals:   10/18/20 1115 10/18/20 1130  BP: (!) 129/94 131/80  Pulse: (!) 57 (!) 58  Resp: (!) 22 16  Temp:    SpO2: 97% 100%   General: Pt is alert, awake, not in acute distress Cardiovascular: RRR, S1/S2 +, no rubs, no gallops Respiratory: CTA bilaterally, no wheezing, no rhonchi Abdominal: Soft, NT, ND, bowel sounds + Extremities: no edema, no cyanosis  Discharge Instructions  Discharge Instructions    Diet - low sodium heart healthy   Complete by: As directed    Discharge instructions   Complete by: As directed    Please call call MD or return to ER for similar or worsening recurring problem that brought you to hospital or if any fever,nausea/vomiting,abdominal pain, uncontrolled pain, chest pain,  shortness of breath or any other alarming symptoms.  Please follow-up your doctor - pcp and psychiatry as instructed in a week time and call the  office for appointment.  Please avoid alcohol, smoking, or any other illicit substance and maintain healthy habits including taking your regular medications as prescribed.  You were cared for by a hospitalist during your hospital stay. If you have any questions about your discharge medications or the care you received while you were in the hospital after you are discharged, you can call the unit and ask to speak with the hospitalist on call if the hospitalist that took care of you is not available.  Once you are discharged, your primary care physician will handle any further medical issues. Please note that NO REFILLS for any discharge medications will be authorized once you are discharged, as it is imperative that you return to your primary care physician (or establish a relationship with a primary care physician if you do not have one) for your aftercare needs so that they can reassess your need for medications and monitor your lab values   Increase activity slowly   Complete by: As directed      Allergies as of 10/18/2020      Reactions   Celexa [citalopram]    Muscle tighness   Chlorpromazine Hcl Other (See Comments)   Pt does not remember this allergy.  May be 20 years ago.   Iodine    Skin has burning sensation.   Sulfonamide Derivatives    Patient does not know of this allergy or the reaction.   Voltaren [diclofenac Sodium]    voltaren has caused his arm to swell in the past, does not remember when this happened.      Medication List    STOP taking these medications   amoxicillin-clavulanate 875-125 MG tablet Commonly known as: AUGMENTIN     TAKE these medications   escitalopram 5 MG tablet Commonly known as: LEXAPRO Take 1 tablet (5 mg total) by mouth daily.   Flovent HFA 220 MCG/ACT inhaler Generic drug: fluticasone USE 2 PUFFS TWICE DAILY. What changed: See the new instructions.   ketotifen 0.025 % ophthalmic solution Commonly known as: ZADITOR Place 1 drop into  both eyes 2 (two) times daily as needed (allergies).   levalbuterol 45 MCG/ACT inhaler Commonly known as: Xopenex HFA Inhale 2 puffs into the lungs every 6 (six) hours as needed for wheezing.   OVER THE COUNTER MEDICATION Take 1 capsule by mouth with breakfast, with lunch, and with evening meal. Digestive enzyme supplement Enzamatica   Pancrelipase (Lip-Prot-Amyl) 24000-76000 units Cpep Commonly known as: Creon TAKE 1 OR 2 CAPSULES WITH EVERY MEAL.   pantoprazole 40 MG tablet Commonly known as: PROTONIX TAKE 1 TABLET BY MOUTH TWICE DAILY.   polyethylene glycol 17 g packet Commonly known as: MIRALAX / GLYCOLAX Take 17 g by mouth daily as needed for mild constipation.   polyvinyl alcohol 1.4 % ophthalmic solution Commonly known as: LIQUIFILM TEARS Place 1 drop into both eyes as needed for dry eyes.   psyllium 28 % packet Commonly known as: METAMUCIL SMOOTH TEXTURE Take 1 packet by mouth  every other day.   Ventolin HFA 108 (90 Base) MCG/ACT inhaler Generic drug: albuterol INHALE 2 PUFFS EVERY 6 HOURS AS NEEDED FOR WHEEZING OR SHORTNESS OF BREATH.       Allergies  Allergen Reactions  . Celexa [Citalopram]     Muscle tighness  . Chlorpromazine Hcl Other (See Comments)    Pt does not remember this allergy.  May be 20 years ago.  . Iodine     Skin has burning sensation.  . Sulfonamide Derivatives     Patient does not know of this allergy or the reaction.  . Voltaren [Diclofenac Sodium]     voltaren has caused his arm to swell in the past, does not remember when this happened.    The results of significant diagnostics from this hospitalization (including imaging, microbiology, ancillary and laboratory) are listed below for reference.    Microbiology: Recent Results (from the past 240 hour(s))  Respiratory Panel by RT PCR (Flu A&B, Covid) - Nasopharyngeal Swab     Status: None   Collection Time: 10/18/20 12:04 AM   Specimen: Nasopharyngeal Swab  Result Value Ref Range  Status   SARS Coronavirus 2 by RT PCR NEGATIVE NEGATIVE Final    Comment: (NOTE) SARS-CoV-2 target nucleic acids are NOT DETECTED.  The SARS-CoV-2 RNA is generally detectable in upper respiratoy specimens during the acute phase of infection. The lowest concentration of SARS-CoV-2 viral copies this assay can detect is 131 copies/mL. A negative result does not preclude SARS-Cov-2 infection and should not be used as the sole basis for treatment or other patient management decisions. A negative result may occur with  improper specimen collection/handling, submission of specimen other than nasopharyngeal swab, presence of viral mutation(s) within the areas targeted by this assay, and inadequate number of viral copies (<131 copies/mL). A negative result must be combined with clinical observations, patient history, and epidemiological information. The expected result is Negative.  Fact Sheet for Patients:  PinkCheek.be  Fact Sheet for Healthcare Providers:  GravelBags.it  This test is no t yet approved or cleared by the [place] FDA and  has been authorized for detection and/or diagnosis of SARS-CoV-2 by FDA under an Emergency Use Authorization (EUA). This EUA will remain  in effect (meaning this test can be used) for the duration of the COVID-19 declaration under Section 564(b)(1) of the Act, 21 U.S.C. section 360bbb-3(b)(1), unless the authorization is terminated or revoked sooner.     Influenza A by PCR NEGATIVE NEGATIVE Final   Influenza B by PCR NEGATIVE NEGATIVE Final    Comment: (NOTE) The Xpert Xpress SARS-CoV-2/FLU/RSV assay is intended as an aid in  the diagnosis of influenza from Nasopharyngeal swab specimens and  should not be used as a sole basis for treatment. Nasal washings and  aspirates are unacceptable for Xpert Xpress SARS-CoV-2/FLU/RSV  testing.  Fact Sheet for  Patients: PinkCheek.be  Fact Sheet for Healthcare Providers: GravelBags.it  This test is not yet approved or cleared by the [place] FDA and  has been authorized for detection and/or diagnosis of SARS-CoV-2 by  FDA under an Emergency Use Authorization (EUA). This EUA will remain  in effect (meaning this test can be used) for the duration of the  Covid-19 declaration under Section 564(b)(1) of the Act, 21  U.S.C. section 360bbb-3(b)(1), unless the authorization is  terminated or revoked. Performed at [facility], [place] [address] [place], [place] [address]     Procedures/Studies: CT Head Wo Contrast  Result Date: 10/18/2020 CLINICAL  DATA:  Syncope. Loss of consciousness, diaphoresis, dizziness, anxiety, and shortness of breath after CBD consumption. EXAM: CT HEAD WITHOUT CONTRAST TECHNIQUE: Contiguous axial images were obtained from the base of the skull through the vertex without intravenous contrast. COMPARISON:  06/13/2005 FINDINGS: Brain: No evidence of acute infarction, hemorrhage, hydrocephalus, extra-axial collection or mass lesion/mass effect. Vascular: No hyperdense vessel or unexpected calcification. Skull: Normal. Negative for fracture or focal lesion. Sinuses/Orbits: Paranasal sinuses and mastoid air cells are clear. Other: None. IMPRESSION: No acute intracranial abnormalities. Electronically Signed   By: Lucienne Capers M.D.   On: 10/18/2020 00:46   DG Chest Portable 1 View  Result Date: 10/18/2020 CLINICAL DATA:  Syncope. EXAM: PORTABLE CHEST 1 VIEW COMPARISON:  September 26, 2012 FINDINGS: The heart size and mediastinal contours are within normal limits. Both lungs are clear. The visualized skeletal structures are unremarkable. IMPRESSION: No active disease. Electronically Signed   By: Virgina Norfolk M.D.   On: 10/18/2020 00:19   ECHOCARDIOGRAM COMPLETE  Result Date: 10/18/2020    ECHOCARDIOGRAM  REPORT   Patient Name:   Joe Lawrence Date of Exam: 10/18/2020 Medical Rec #:  [id number]         Height:       70.0 in Accession #:    [id number]        Weight:       150.0 lb Date of Birth:  [birth year]         BSA:          1.847 m Patient Age:    49 years          BP:           135/81 mmHg Patient Gender: M                 HR:           53 bpm. Exam Location:  Inpatient Procedure: 2D Echo, Cardiac Doppler and Color Doppler Indications:    Syncope 780.2 / R55  History:        Patient has no prior history of Echocardiogram examinations.                 Risk Factors:Former Smoker. GERD.  Sonographer:    Vickie Epley RDCS Referring Phys: [id number] [place] IMPRESSIONS  1. Left ventricular ejection fraction, by estimation, is 55 to 60%. The left ventricle has normal function. The left ventricle has no regional wall motion abnormalities. Left ventricular diastolic parameters were normal.  2. Right ventricular systolic function is normal. The right ventricular size is normal.  3. The mitral valve is normal in structure. Trivial mitral valve regurgitation. No evidence of mitral stenosis.  4. The aortic valve is tricuspid. Aortic valve regurgitation is not visualized. No aortic stenosis is present.  5. The inferior vena cava is normal in size with greater than 50% respiratory variability, suggesting right atrial pressure of 3 mmHg. Comparison(s): No prior Echocardiogram. Conclusion(s)/Recommendation(s): Normal biventricular function without evidence of hemodynamically significant valvular heart disease. FINDINGS  Left Ventricle: Left ventricular ejection fraction, by estimation, is 55 to 60%. The left ventricle has normal function. The left ventricle has no regional wall motion abnormalities. The left ventricular internal cavity size was normal in size. There is  no left ventricular hypertrophy. Left ventricular diastolic parameters were normal. Right Ventricle: The right ventricular size is normal. No increase in right  ventricular wall thickness. Right ventricular systolic function is normal. Left Atrium: Left atrial size was normal in size. Right Atrium:  Right atrial size was normal in size. Pericardium: There is no evidence of pericardial effusion. Mitral Valve: The mitral valve is normal in structure. Trivial mitral valve regurgitation. No evidence of mitral valve stenosis. Tricuspid Valve: The tricuspid valve is normal in structure. Tricuspid valve regurgitation is trivial. No evidence of tricuspid stenosis. Aortic Valve: The aortic valve is tricuspid. Aortic valve regurgitation is not visualized. No aortic stenosis is present. Pulmonic Valve: The pulmonic valve was grossly normal. Pulmonic valve regurgitation is not visualized. Aorta: The aortic root and ascending aorta are structurally normal, with no evidence of dilitation. Venous: The inferior vena cava is normal in size with greater than 50% respiratory variability, suggesting right atrial pressure of 3 mmHg. IAS/Shunts: The atrial septum is grossly normal.  LEFT VENTRICLE PLAX 2D LVIDd:         5.50 cm      Diastology LVIDs:         3.80 cm      LV e' medial:    9.25 cm/s LV PW:         0.80 cm      LV E/e' medial:  8.2 LV IVS:        0.80 cm      LV e' lateral:   11.30 cm/s LVOT diam:     2.00 cm      LV E/e' lateral: 6.8 LV SV:         67 LV SV Index:   36 LVOT Area:     3.14 cm  LV Volumes (MOD) LV vol d, MOD A2C: 104.0 ml LV vol d, MOD A4C: 118.0 ml LV vol s, MOD A2C: 46.0 ml LV vol s, MOD A4C: 47.1 ml LV SV MOD A2C:     58.0 ml LV SV MOD A4C:     118.0 ml LV SV MOD BP:      68.0 ml RIGHT VENTRICLE RV S prime:     10.90 cm/s TAPSE (M-mode): 2.7 cm LEFT ATRIUM             Index       RIGHT ATRIUM           Index LA diam:        3.40 cm 1.84 cm/m  RA Area:     11.40 cm LA Vol (A2C):   30.3 ml 16.40 ml/m RA Volume:   21.80 ml  11.80 ml/m LA Vol (A4C):   23.1 ml 12.51 ml/m LA Biplane Vol: 26.4 ml 14.29 ml/m  AORTIC VALVE LVOT Vmax:   96.00 cm/s LVOT Vmean:  61.600  cm/s LVOT VTI:    0.214 m  AORTA Ao Root diam: 3.30 cm MITRAL VALVE MV Area (PHT): 3.48 cm    SHUNTS MV Decel Time: 218 msec    Systemic VTI:  0.21 m MV E velocity: 76.30 cm/s  Systemic Diam: 2.00 cm MV A velocity: 52.70 cm/s MV E/A ratio:  1.45 Buford Dresser MD Electronically signed by Buford Dresser MD Signature Date/Time: 10/18/2020/3:42:10 PM    Final     Labs: BNP (last 3 results) No results for input(s): BNP in the last 8760 hours. Basic Metabolic Panel: Recent Labs  Lab 10/17/20 1159 10/18/20 0525  NA 136 138  K 3.6 3.8  CL 100 106  CO2 27 25  GLUCOSE 137* 144*  BUN 11 8  CREATININE 1.03 0.96  CALCIUM 9.5 9.1   Liver Function Tests: No results for input(s): AST, ALT, ALKPHOS, BILITOT, PROT, ALBUMIN in the last  168 hours. No results for input(s): LIPASE, AMYLASE in the last 168 hours. No results for input(s): AMMONIA in the last 168 hours. CBC: Recent Labs  Lab 10/17/20 1159 10/18/20 0525  WBC 3.8* 3.8*  HGB 14.4 12.6*  HCT 45.8 39.4  MCV 92.7 92.3  PLT 233 204   Cardiac Enzymes: No results for input(s): CKTOTAL, CKMB, CKMBINDEX, TROPONINI in the last 168 hours. BNP: Invalid input(s): POCBNP CBG: Recent Labs  Lab 10/17/20 2347  GLUCAP 94   D-Dimer No results for input(s): DDIMER in the last 72 hours. Hgb A1c No results for input(s): HGBA1C in the last 72 hours. Lipid Profile No results for input(s): CHOL, HDL, LDLCALC, TRIG, CHOLHDL, LDLDIRECT in the last 72 hours. Thyroid function studies No results for input(s): TSH, T4TOTAL, T3FREE, THYROIDAB in the last 72 hours.  Invalid input(s): FREET3 Anemia work up No results for input(s): VITAMINB12, FOLATE, FERRITIN, TIBC, IRON, RETICCTPCT in the last 72 hours. Urinalysis    Component Value Date/Time   COLORURINE YELLOW 10/17/2020 1949   APPEARANCEUR CLEAR 10/17/2020 1949   LABSPEC 1.014 10/17/2020 1949   PHURINE 7.0 10/17/2020 1949   GLUCOSEU NEGATIVE 10/17/2020 1949   HGBUR NEGATIVE  10/17/2020 1949   BILIRUBINUR NEGATIVE 10/17/2020 1949   KETONESUR NEGATIVE 10/17/2020 1949   PROTEINUR NEGATIVE 10/17/2020 1949   UROBILINOGEN 0.2 09/26/2012 1830   NITRITE NEGATIVE 10/17/2020 1949   LEUKOCYTESUR NEGATIVE 10/17/2020 1949   Sepsis Labs Invalid input(s): PROCALCITONIN,  WBC,  LACTICIDVEN Microbiology Recent Results (from the past 240 hour(s))  Respiratory Panel by RT PCR (Flu A&B, Covid) - Nasopharyngeal Swab     Status: None   Collection Time: 10/18/20 12:04 AM   Specimen: Nasopharyngeal Swab  Result Value Ref Range Status   SARS Coronavirus 2 by RT PCR NEGATIVE NEGATIVE Final    Comment: (NOTE) SARS-CoV-2 target nucleic acids are NOT DETECTED.  The SARS-CoV-2 RNA is generally detectable in upper respiratoy specimens during the acute phase of infection. The lowest concentration of SARS-CoV-2 viral copies this assay can detect is 131 copies/mL. A negative result does not preclude SARS-Cov-2 infection and should not be used as the sole basis for treatment or other patient management decisions. A negative result may occur with  improper specimen collection/handling, submission of specimen other than nasopharyngeal swab, presence of viral mutation(s) within the areas targeted by this assay, and inadequate number of viral copies (<131 copies/mL). A negative result must be combined with clinical observations, patient history, and epidemiological information. The expected result is Negative.  Fact Sheet for Patients:  PinkCheek.be  Fact Sheet for Healthcare Providers:  GravelBags.it  This test is no t yet approved or cleared by the [place] FDA and  has been authorized for detection and/or diagnosis of SARS-CoV-2 by FDA under an Emergency Use Authorization (EUA). This EUA will remain  in effect (meaning this test can be used) for the duration of the COVID-19 declaration under Section 564(b)(1) of the  Act, 21 U.S.C. section 360bbb-3(b)(1), unless the authorization is terminated or revoked sooner.     Influenza A by PCR NEGATIVE NEGATIVE Final   Influenza B by PCR NEGATIVE NEGATIVE Final    Comment: (NOTE) The Xpert Xpress SARS-CoV-2/FLU/RSV assay is intended as an aid in  the diagnosis of influenza from Nasopharyngeal swab specimens and  should not be used as a sole basis for treatment. Nasal washings and  aspirates are unacceptable for Xpert Xpress SARS-CoV-2/FLU/RSV  testing.  Fact Sheet for Patients: PinkCheek.be  Fact Sheet for Healthcare  Providers: GravelBags.it  This test is not yet approved or cleared by the [place] and  has been authorized for detection and/or diagnosis of SARS-CoV-2 by  FDA under an Emergency Use Authorization (EUA). This EUA will remain  in effect (meaning this test can be used) for the duration of the  Covid-19 declaration under Section 564(b)(1) of the Act, 21  U.S.C. section 360bbb-3(b)(1), unless the authorization is  terminated or revoked. Performed at [facility], [place] [address], [place], [place] [address]      Time coordinating discharge: 25  minutes  SIGNED: Antonieta Pert, MD  [facility] 10/18/2020, 3:52 PM  If 7PM-7AM, please contact night-coverage [email or web address]

## 2020-10-18 NOTE — ED Notes (Signed)
Ordered breakfast 

## 2020-10-27 DIAGNOSIS — Z23 Encounter for immunization: Secondary | ICD-10-CM | POA: Diagnosis not present

## 2020-11-08 ENCOUNTER — Encounter: Payer: Self-pay | Admitting: Family Medicine

## 2020-11-08 ENCOUNTER — Ambulatory Visit (INDEPENDENT_AMBULATORY_CARE_PROVIDER_SITE_OTHER): Payer: Medicare Other | Admitting: Family Medicine

## 2020-11-08 ENCOUNTER — Other Ambulatory Visit: Payer: Self-pay

## 2020-11-08 VITALS — BP 128/80 | HR 63 | Temp 97.5°F | Wt 152.0 lb

## 2020-11-08 DIAGNOSIS — F418 Other specified anxiety disorders: Secondary | ICD-10-CM | POA: Diagnosis not present

## 2020-11-08 MED ORDER — ESCITALOPRAM OXALATE 10 MG PO TABS: 10.0000 mg | ORAL_TABLET | Freq: Every day | ORAL | 1 refills | Status: DC
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
## 2020-11-08 NOTE — Progress Notes (Signed)
   Subjective:    Patient ID: Joe Lawrence, male    DOB: 10-18-1971, 49 y.o.   MRN: [id number]  HPI He is here for consult concerning continued difficulty with anxiety, mood swings, diffuse myalgias.  He states that he has been having difficulty with this for the last several years.  He came off of all of his medications for years ago and has been struggling with it since then.  Recently has become more of an issue.  He tried taking CBD oil but apparently had an adverse reaction to this and had a syncopal episode.  He was seen in the emergency room and admitted.  The hospital record including the emergency room and discharge summary was reviewed.  He was sent home on Lexapro 5 mg.  He has been on that about 3 weeks and does note that he seems to be more relaxed and less agitated.  He thinks he is maybe 20 to 30% better.   Review of Systems     Objective:   Physical Exam Alert and in no distress with appropriate affect PHQ score of 17.       Assessment & Plan:  Depression with anxiety - Plan: escitalopram (LEXAPRO) 10 MG tablet I will increase his Lexapro to 10 mg.  He will set up a virtual visit in approximately 1 month.  He was comfortable with that.  Approximately 32 minutes spent discussing all these issues with him and review of his record.Marland Kitchen

## 2020-11-28 ENCOUNTER — Other Ambulatory Visit: Payer: Self-pay | Admitting: Family Medicine

## 2020-11-28 DIAGNOSIS — J4530 Mild persistent asthma, uncomplicated: Secondary | ICD-10-CM

## 2020-11-28 DIAGNOSIS — K219 Gastro-esophageal reflux disease without esophagitis: Secondary | ICD-10-CM

## 2020-11-28 NOTE — Telephone Encounter (Signed)
Joe Lawrence is requesting to fill pt flovent inhaler please advise [facility]

## 2020-12-06 ENCOUNTER — Encounter: Payer: Self-pay | Admitting: Family Medicine

## 2020-12-06 ENCOUNTER — Other Ambulatory Visit: Payer: Self-pay

## 2020-12-06 ENCOUNTER — Telehealth (INDEPENDENT_AMBULATORY_CARE_PROVIDER_SITE_OTHER): Payer: Medicare Other | Admitting: Family Medicine

## 2020-12-06 VITALS — Wt 150.0 lb

## 2020-12-06 DIAGNOSIS — L989 Disorder of the skin and subcutaneous tissue, unspecified: Secondary | ICD-10-CM | POA: Diagnosis not present

## 2020-12-06 DIAGNOSIS — F418 Other specified anxiety disorders: Secondary | ICD-10-CM

## 2020-12-06 DIAGNOSIS — H04123 Dry eye syndrome of bilateral lacrimal glands: Secondary | ICD-10-CM | POA: Diagnosis not present

## 2020-12-06 MED ORDER — ESCITALOPRAM OXALATE 20 MG PO TABS: 20.0000 mg | ORAL_TABLET | Freq: Every day | ORAL | 0 refills | Status: DC

## 2020-12-06 NOTE — Progress Notes (Addendum)
   Subjective:    Patient ID: Joe Lawrence, male    DOB: [birth year], 49 y.o.   MRN: [id number]  HPI I connected with  Joe Lawrence on 12/06/20 by a video enabled telemedicine application and verified that I am speaking with the correct person using two identifiers.  Caregility used. I'm in office he is at home I discussed the limitations of evaluation and management by telemedicine. The patient expressed understanding and agreed to proceed. Caregility used,Me:office  Patient:home This is a follow-up visit on anxiety and depression. He is now taking 10 mg of Lexapro and notes increased energy and better focus as well as decreased obsessive behavior. He also notes that he is being much nicer to his mother who he lives with. She is appreciative of that. He also complains of a several month history of dry eyes and he has used multiple over-the-counter medications without much success. He also has a lesion on his back that is causing some difficulty in making him itch.  Review of Systems     Objective:   Physical Exam Alert and in no distress. Visual exam of the eyes via camera shows no major lesions.       Assessment & Plan:  Depression with anxiety  Skin lesion  Dry eyes He is to double the dosing of his 10 mg total finish that prescription and I will call in 20 mg. He will set up an appointment in 1 month so we can discuss the increased dosing of his Lexapro and possibly biopsies or remove his skin lesion. He will set up an appointment to see ophthalmology to determine proper care for his dry eyes. 22 minutes spent in counseling and coordination of care.

## 2020-12-14 ENCOUNTER — Other Ambulatory Visit: Payer: Self-pay

## 2020-12-14 ENCOUNTER — Other Ambulatory Visit (INDEPENDENT_AMBULATORY_CARE_PROVIDER_SITE_OTHER): Payer: Medicare Other

## 2020-12-14 DIAGNOSIS — Z23 Encounter for immunization: Secondary | ICD-10-CM

## 2021-01-08 ENCOUNTER — Other Ambulatory Visit: Payer: Self-pay | Admitting: Family Medicine

## 2021-01-08 ENCOUNTER — Ambulatory Visit (INDEPENDENT_AMBULATORY_CARE_PROVIDER_SITE_OTHER): Payer: Medicare Other | Admitting: Family Medicine

## 2021-01-08 ENCOUNTER — Encounter: Payer: Self-pay | Admitting: Family Medicine

## 2021-01-08 ENCOUNTER — Other Ambulatory Visit: Payer: Self-pay

## 2021-01-08 VITALS — BP 112/74 | HR 61 | Temp 96.8°F | Wt 148.8 lb

## 2021-01-08 DIAGNOSIS — L820 Inflamed seborrheic keratosis: Secondary | ICD-10-CM | POA: Diagnosis not present

## 2021-01-08 DIAGNOSIS — D229 Melanocytic nevi, unspecified: Secondary | ICD-10-CM | POA: Diagnosis not present

## 2021-01-08 DIAGNOSIS — F418 Other specified anxiety disorders: Secondary | ICD-10-CM

## 2021-01-08 HISTORY — PX: OTHER SURGICAL HISTORY: SHX169

## 2021-01-08 MED ORDER — LIDOCAINE-EPINEPHRINE 1 %-1:100000 IJ SOLN
10.0000 mL | Freq: Once | INTRAMUSCULAR | Status: AC
  Administered 2021-01-08: 10 mL via INTRADERMAL

## 2021-01-08 MED ORDER — LIDOCAINE HCL 1 % IJ SOLN: 10.0000 mL | Freq: Once | INTRAMUSCULAR | Status: DC

## 2021-01-08 MED ORDER — QUETIAPINE FUMARATE ER 50 MG PO TB24: 50.0000 mg | ORAL_TABLET | Freq: Every day | ORAL | 1 refills | Status: DC

## 2021-01-08 NOTE — Telephone Encounter (Signed)
[facility] is requesting to fill pt lexapro. Please advise .[facility] 

## 2021-01-08 NOTE — Addendum Note (Signed)
Addended by: Elyse Jarvis on: 01/08/2021 03:38 PM   Modules accepted: Orders

## 2021-01-08 NOTE — Progress Notes (Signed)
   Subjective:    Patient ID: Joe Lawrence, male    DOB: 12-Mar-1971, 50 y.o.   MRN: [id number]  HPI He is here for recheck on underlying anxiety and depression.  He states that he now feels much more perky but is also more hyper.  In the past he notes that Seroquel seem to work to help quiet him down.  He was given this medicine 3 times per day.  He was given this by Dr. Sabra Heck ,his psychiatrist. He also has a lesion on his back that he would like evaluated.   Review of Systems     Objective:   Physical Exam Alert and in no distress with appropriate affect. He has a 0.8 cm round smooth lesion with well-demarcated borders however there is some central hyperpigmentation associated with that.  It is noted in the mid back area.       Assessment & Plan:  Depression with anxiety - Plan: QUEtiapine (SEROQUEL XR) 50 MG TB24 24 hr tablet  Atypical mole He is to continue on the Lexapro and start Seroquel XR.  And see if it is 1 pill rather than 3 times per day will work.  Lesion was injected with Xylocaine and epinephrine.  A punch biopsy was done getting most of the lesion.  Steri-Strips were used to hold it in place.  He will call if he has difficulties with the skin lesion and to call me in 1 month to let me know how he is doing on the Seroquel.

## 2021-01-11 ENCOUNTER — Telehealth: Payer: Self-pay

## 2021-01-11 NOTE — Telephone Encounter (Signed)
Pt called that ins requiring P.A. for Seroquel XR & that he really needs ASAP, advised would work on P.A.

## 2021-01-12 NOTE — Telephone Encounter (Signed)
P.A. QUETIAPINE ER completed

## 2021-01-14 NOTE — Telephone Encounter (Signed)
P.A. approved til 01/11/22, pt informed

## 2021-01-26 ENCOUNTER — Other Ambulatory Visit: Payer: Self-pay | Admitting: Family Medicine

## 2021-01-26 ENCOUNTER — Telehealth (INDEPENDENT_AMBULATORY_CARE_PROVIDER_SITE_OTHER): Payer: Medicare Other | Admitting: Family Medicine

## 2021-01-26 ENCOUNTER — Encounter: Payer: Self-pay | Admitting: Family Medicine

## 2021-01-26 VITALS — BP 126/86 | Temp 97.8°F | Wt 148.0 lb

## 2021-01-26 DIAGNOSIS — F418 Other specified anxiety disorders: Secondary | ICD-10-CM | POA: Diagnosis not present

## 2021-01-26 DIAGNOSIS — J4530 Mild persistent asthma, uncomplicated: Secondary | ICD-10-CM

## 2021-01-26 MED ORDER — QUETIAPINE FUMARATE ER 50 MG PO TB24: 50.0000 mg | ORAL_TABLET | Freq: Two times a day (BID) | ORAL | 3 refills | Status: DC

## 2021-01-26 NOTE — Telephone Encounter (Signed)
[facility] is requesting to fill pt pt ventolin. Please advise [facility]

## 2021-01-26 NOTE — Progress Notes (Signed)
° °  Subjective:    Patient ID: Joe Lawrence, male    DOB: Jul 07, 1971, 50 y.o.   MRN: [id number]  HPI I connected with  Joe Lawrence on 01/26/21 by a video enabled telemedicine application and verified that I am speaking with the correct person using two identifiers.  Caregility FU:XNATFT patient:home.  His mother is with him. I discussed the limitations of evaluation and management by telemedicine. The patient expressed understanding and agreed to proceed. He apparently is getting ready go through a disability review.  He apparently has been in several psychiatric hospitals over the years.  He is done fairly well but is been having difficulty with anxiety recently.  He has been taking Seroquel but states that tends to wear off as the day goes on.  He gets roughly 6 hours at the benefit out of it.  He does have an appointment to see Dr. Toy Care I think he said in August. He states that he has been on higher doses and would like to go to twice a day dosing on this to see who would do.  Review of Systems     Objective:   Physical Exam Alert and in no distress otherwise not examined affect appears normal.       Assessment & Plan:  Depression with anxiety - Plan: QUEtiapine (SEROQUEL XR) 50 MG TB24 24 hr tablet I will go to the higher dosing.  Encouraged him to call Dr. Starleen Arms office to see if he can get an appointment sooner. 20 minutes spent in reviewing his record today, counseling and documentation.

## 2021-02-25 ENCOUNTER — Other Ambulatory Visit: Payer: Self-pay | Admitting: Family Medicine

## 2021-02-25 DIAGNOSIS — K219 Gastro-esophageal reflux disease without esophagitis: Secondary | ICD-10-CM

## 2021-03-01 DIAGNOSIS — H10413 Chronic giant papillary conjunctivitis, bilateral: Secondary | ICD-10-CM | POA: Diagnosis not present

## 2021-03-01 DIAGNOSIS — H5211 Myopia, right eye: Secondary | ICD-10-CM | POA: Diagnosis not present

## 2021-03-01 DIAGNOSIS — H524 Presbyopia: Secondary | ICD-10-CM | POA: Diagnosis not present

## 2021-03-01 DIAGNOSIS — H04123 Dry eye syndrome of bilateral lacrimal glands: Secondary | ICD-10-CM | POA: Diagnosis not present

## 2021-03-30 ENCOUNTER — Other Ambulatory Visit: Payer: Self-pay | Admitting: Family Medicine

## 2021-03-30 DIAGNOSIS — J4530 Mild persistent asthma, uncomplicated: Secondary | ICD-10-CM

## 2021-04-02 NOTE — Telephone Encounter (Signed)
[place] request inhaler . [place]

## 2021-04-06 DIAGNOSIS — Z23 Encounter for immunization: Secondary | ICD-10-CM | POA: Diagnosis not present

## 2021-05-08 ENCOUNTER — Encounter: Payer: Self-pay | Admitting: Family Medicine

## 2021-05-11 ENCOUNTER — Other Ambulatory Visit: Payer: Self-pay

## 2021-05-11 ENCOUNTER — Ambulatory Visit (INDEPENDENT_AMBULATORY_CARE_PROVIDER_SITE_OTHER): Payer: Medicare Other | Admitting: Family Medicine

## 2021-05-11 ENCOUNTER — Encounter: Payer: Self-pay | Admitting: Family Medicine

## 2021-05-11 VITALS — BP 142/96 | HR 63 | Temp 97.0°F | Ht 70.0 in | Wt 162.6 lb

## 2021-05-11 DIAGNOSIS — M5442 Lumbago with sciatica, left side: Secondary | ICD-10-CM

## 2021-05-11 MED ORDER — PREDNISONE 10 MG (48) PO TBPK: ORAL_TABLET | ORAL | 0 refills | Status: DC

## 2021-05-11 MED ORDER — HYDROCODONE-ACETAMINOPHEN 5-325 MG PO TABS: 1.0000 | ORAL_TABLET | Freq: Four times a day (QID) | ORAL | 0 refills | Status: DC | PRN

## 2021-05-11 NOTE — Progress Notes (Signed)
   Subjective:    Patient ID: Joe Lawrence, male    DOB: December 26, 1971, 50 y.o.   MRN: [id number]  HPI He complains of a 3-day history of left-sided low back pain.  He does play disc golf and was trying some new techniques playing and developed the pain.  He has a previous history of difficulty with low back pain dating several years ago.  Apparently he had an MRI which showed a disc.  He was treated with epidural injections and physical therapy and did do fairly well on it.  He states that the pain is in the left leg and does radiate down his leg.  No bowel or bladder trouble at the present time.   Review of Systems     Objective:   Physical Exam Alert and complaining of left-sided back pain.  Normal lumbar lordotic curve with good motion.  Complains of some pain in the left sacral area.  Hip motion is normal.  DTRs are 2+.  Straight leg raising positive at 45 degrees with positive sciatic stretch.       Assessment & Plan:  Acute left-sided low back pain with left-sided sciatica - Plan: predniSONE (STERAPRED UNI-PAK 48 TAB) 10 MG (48) TBPK tablet, HYDROcodone-acetaminophen (NORCO) 5-325 MG tablet I explained that this is probably again related to the herniated disc from his previous problems.  I will try to calm it down with steroids and pain medicine.  He will return here in about 2 weeks and hopefully we can send him for physical therapy.  If not we will refer back to orthopedics.

## 2021-05-22 ENCOUNTER — Other Ambulatory Visit: Payer: Self-pay | Admitting: Family Medicine

## 2021-05-22 DIAGNOSIS — F418 Other specified anxiety disorders: Secondary | ICD-10-CM

## 2021-05-22 NOTE — Telephone Encounter (Signed)
[facility] is requesting to fill pt seroquel. Please advise [facility]

## 2021-05-25 ENCOUNTER — Other Ambulatory Visit: Payer: Self-pay | Admitting: Family Medicine

## 2021-05-25 DIAGNOSIS — J4530 Mild persistent asthma, uncomplicated: Secondary | ICD-10-CM

## 2021-05-25 NOTE — Telephone Encounter (Signed)
Ok to refill 

## 2021-05-26 ENCOUNTER — Other Ambulatory Visit: Payer: Self-pay | Admitting: Medical

## 2021-05-26 DIAGNOSIS — K219 Gastro-esophageal reflux disease without esophagitis: Secondary | ICD-10-CM

## 2021-05-29 ENCOUNTER — Encounter: Payer: Self-pay | Admitting: Family Medicine

## 2021-05-29 ENCOUNTER — Ambulatory Visit (INDEPENDENT_AMBULATORY_CARE_PROVIDER_SITE_OTHER): Payer: Medicare Other | Admitting: Family Medicine

## 2021-05-29 ENCOUNTER — Other Ambulatory Visit: Payer: Self-pay

## 2021-05-29 VITALS — BP 162/90 | HR 82 | Temp 97.1°F | Wt 167.0 lb

## 2021-05-29 DIAGNOSIS — F418 Other specified anxiety disorders: Secondary | ICD-10-CM

## 2021-05-29 DIAGNOSIS — M5442 Lumbago with sciatica, left side: Secondary | ICD-10-CM

## 2021-05-29 NOTE — Progress Notes (Signed)
   Subjective:    Patient ID: Joe Lawrence, male    DOB: 10-26-1971, 50 y.o.   MRN: [id number]  HPI He is here for a recheck.  He does state that he is feeling much better and up until the day was actually pain-free but started having little bit more difficulty.  He states that overall he does feel least 50% better.  He has been on Seroquel at his request because he is felt his anxiety and everything was coming back.  He is scheduled to see Dr. Toy Care in August.  He was on Seroquel in the past but stated even then it caused difficulty with muscle aches and pains as well as slight dizziness and he is concerned it is making him gain weight.   Review of Systems     Objective:   Physical Exam Alert and in no distress.  Good motion of his back without pain.  Straight leg raising is positive at roughly 60 degrees.  DTRs normal.       Assessment & Plan:  Acute left-sided low back pain with left-sided sciatica  Depression with anxiety Recommend continued conservative care but if he has more difficulty, I will refer him for possible epidural which he apparently has had in the past. He is to cut down on his Seroquel by 1 pill to see if that will help his symptoms.  I explained that I am reluctant to readjust his medications and would prefer to let the psychiatrist handle that.

## 2021-05-29 NOTE — Patient Instructions (Signed)
Listen to your body in terms of proper sitting standing and lifting.   Lets go with conservative care and as long you are slowly improved we will leave you alone if not then we would need to send you to possibly get another shot.

## 2021-07-22 ENCOUNTER — Other Ambulatory Visit: Payer: Self-pay | Admitting: Family Medicine

## 2021-07-22 DIAGNOSIS — J4530 Mild persistent asthma, uncomplicated: Secondary | ICD-10-CM

## 2021-08-22 ENCOUNTER — Other Ambulatory Visit: Payer: Self-pay | Admitting: Family Medicine

## 2021-08-22 DIAGNOSIS — K219 Gastro-esophageal reflux disease without esophagitis: Secondary | ICD-10-CM

## 2021-09-07 DIAGNOSIS — Z23 Encounter for immunization: Secondary | ICD-10-CM | POA: Diagnosis not present

## 2021-09-22 ENCOUNTER — Other Ambulatory Visit: Payer: Self-pay | Admitting: Family Medicine

## 2021-09-22 DIAGNOSIS — J4530 Mild persistent asthma, uncomplicated: Secondary | ICD-10-CM

## 2021-09-24 NOTE — Telephone Encounter (Signed)
Joe Lawrence is requesting to fill pt flovent. Please advise [facility]

## 2021-10-25 ENCOUNTER — Ambulatory Visit: Payer: Self-pay | Admitting: Family Medicine

## 2021-11-21 ENCOUNTER — Other Ambulatory Visit: Payer: Self-pay | Admitting: Family Medicine

## 2021-11-21 DIAGNOSIS — K219 Gastro-esophageal reflux disease without esophagitis: Secondary | ICD-10-CM

## 2021-11-21 NOTE — Telephone Encounter (Signed)
Pt was called to schedule at least a med check . LVM for a call back. [place]

## 2021-12-05 ENCOUNTER — Other Ambulatory Visit: Payer: Self-pay

## 2021-12-05 ENCOUNTER — Ambulatory Visit (INDEPENDENT_AMBULATORY_CARE_PROVIDER_SITE_OTHER): Payer: Medicare Other | Admitting: Family Medicine

## 2021-12-05 VITALS — BP 138/78 | HR 75 | Temp 96.4°F | Wt 148.4 lb

## 2021-12-05 DIAGNOSIS — K219 Gastro-esophageal reflux disease without esophagitis: Secondary | ICD-10-CM

## 2021-12-05 DIAGNOSIS — K8689 Other specified diseases of pancreas: Secondary | ICD-10-CM | POA: Diagnosis not present

## 2021-12-05 DIAGNOSIS — Z8719 Personal history of other diseases of the digestive system: Secondary | ICD-10-CM

## 2021-12-05 NOTE — Progress Notes (Signed)
   Subjective:    Patient ID: Joe Lawrence, male    DOB: 21-Oct-1971, 50 y.o.   MRN: [id number]  HPI He is here mainly to get blood work prior to being seen for complete exam.  He does indicate that about a month ago he had difficulty with vomiting and diarrhea that lasted at least 7 days.  He had abdominal cramping as well as chills but no fever.  He was having as many as 10 stools per day.  He is feeling better with this but states that his bowel habits are still not back to normal.  He is no longer taking Creon and is taking over-the-counter preparation.  He states that Creon caused a lot of gas.  He is now on being followed by Dr.Kaur.  That apparently is going fairly well.   Review of Systems     Objective:   Physical Exam Alert and in no distress otherwise not examined       Assessment & Plan:  Gastroesophageal reflux disease without esophagitis  History of pancreatitis - Plan: CBC with Differential/Platelet, Comprehensive metabolic panel, Lipid panel, Magnesium  Pancreatic insufficiency - Plan: Amylase, Lipase, CBC with Differential/Platelet, Comprehensive metabolic panel, Lipid panel, Magnesium He has had lots of GI issues mainly revolving around alcohol induced pancreatic disease.  He was on Creon but is not on it now.  He is interested in getting a different preparation.  The recent bout of diarrhea could easily cause difficulty with his electrolytes including magnesium.  We will follow-up on this.

## 2021-12-06 LAB — LIPID PANEL
Chol/HDL Ratio: 3 ratio (ref 0.0–5.0)
Cholesterol, Total: 146 mg/dL (ref 100–199)
HDL: 49 mg/dL (ref >39)
LDL Chol Calc (NIH): 83 mg/dL (ref 0–99)
Triglycerides: 69 mg/dL (ref 0–149)
VLDL Cholesterol Cal: 14 mg/dL (ref 5–40)

## 2021-12-06 LAB — CBC WITH DIFFERENTIAL/PLATELET
Basophils Absolute: 0 10*3/uL (ref 0.0–0.2)
Basos: 1 %
EOS (ABSOLUTE): 0 10*3/uL (ref 0.0–0.4)
Eos: 1 %
Hematocrit: 41.6 % (ref 37.5–51.0)
Hemoglobin: 13.8 g/dL (ref 13.0–17.7)
Immature Grans (Abs): 0 10*3/uL (ref 0.0–0.1)
Immature Granulocytes: 0 %
Lymphocytes Absolute: 1 10*3/uL (ref 0.7–3.1)
Lymphs: 33 %
MCH: 28.9 pg (ref 26.6–33.0)
MCHC: 33.2 g/dL (ref 31.5–35.7)
MCV: 87 fL (ref 79–97)
Monocytes Absolute: 0.5 10*3/uL (ref 0.1–0.9)
Monocytes: 18 %
Neutrophils Absolute: 1.4 10*3/uL (ref 1.4–7.0)
Neutrophils: 47 %
Platelets: 350 10*3/uL (ref 150–450)
RBC: 4.77 x10E6/uL (ref 4.14–5.80)
RDW: 12.9 % (ref 11.6–15.4)
WBC: 2.9 10*3/uL — ABNORMAL LOW (ref 3.4–10.8)

## 2021-12-06 LAB — COMPREHENSIVE METABOLIC PANEL
ALT: 11 IU/L (ref 0–44)
AST: 16 IU/L (ref 0–40)
Albumin/Globulin Ratio: 2.3 — ABNORMAL HIGH (ref 1.2–2.2)
Albumin: 4.5 g/dL (ref 3.8–4.9)
Alkaline Phosphatase: 49 IU/L (ref 44–121)
BUN/Creatinine Ratio: 11 (ref 9–20)
BUN: 9 mg/dL (ref 6–24)
Bilirubin Total: 0.6 mg/dL (ref 0.0–1.2)
CO2: 24 mmol/L (ref 20–29)
Calcium: 9.7 mg/dL (ref 8.7–10.2)
Chloride: 96 mmol/L (ref 96–106)
Creatinine, Ser: 0.85 mg/dL (ref 0.76–1.27)
Globulin, Total: 2 g/dL (ref 1.5–4.5)
Glucose: 83 mg/dL (ref 70–99)
Potassium: 4.9 mmol/L (ref 3.5–5.2)
Sodium: 134 mmol/L (ref 134–144)
Total Protein: 6.5 g/dL (ref 6.0–8.5)
eGFR: 105 mL/min/{1.73_m2} (ref >59)

## 2021-12-06 LAB — LIPASE: Lipase: 26 U/L (ref 13–78)

## 2021-12-06 LAB — AMYLASE: Amylase: 59 U/L (ref 31–110)

## 2021-12-06 LAB — MAGNESIUM: Magnesium: 2.2 mg/dL (ref 1.6–2.3)

## 2021-12-17 ENCOUNTER — Other Ambulatory Visit: Payer: Self-pay | Admitting: Family Medicine

## 2021-12-17 DIAGNOSIS — K219 Gastro-esophageal reflux disease without esophagitis: Secondary | ICD-10-CM

## 2021-12-17 DIAGNOSIS — J4530 Mild persistent asthma, uncomplicated: Secondary | ICD-10-CM

## 2021-12-17 NOTE — Telephone Encounter (Signed)
[facility] is requesting to fill pt flovent. Please advise [facility]

## 2021-12-28 ENCOUNTER — Ambulatory Visit (INDEPENDENT_AMBULATORY_CARE_PROVIDER_SITE_OTHER): Payer: Medicare Other | Admitting: Family Medicine

## 2021-12-28 ENCOUNTER — Other Ambulatory Visit: Payer: Self-pay

## 2021-12-28 VITALS — BP 120/70 | HR 78 | Temp 97.5°F | Wt 144.2 lb

## 2021-12-28 DIAGNOSIS — L03119 Cellulitis of unspecified part of limb: Secondary | ICD-10-CM | POA: Diagnosis not present

## 2021-12-28 MED ORDER — DOXYCYCLINE HYCLATE 100 MG PO TABS: 100.0000 mg | ORAL_TABLET | Freq: Two times a day (BID) | ORAL | 0 refills | Status: DC

## 2021-12-28 NOTE — Progress Notes (Signed)
° °  Subjective:    Patient ID: Joe Lawrence, male    DOB: December 20, 1971, 50 y.o.   MRN: [id number]  HPI He is here for evaluation of possible lower extremity infection.  He states that approximately 6 days ago he was moving a refrigerator and using his legs to help move the refrigerator.  He does not remember scraping or scratching the area but after that he noted erythema bilaterally to the medial aspect of the shins that has been slowly getting worse over the last 5 days.   Review of Systems     Objective:   Physical Exam Exam of both medial shin areas does show erythema, warmth and slight discomfort to palpation.  Extends down into the medial aspect of the ankles. A photo on his phone was taken of this.      Assessment & Plan:  Cellulitis of lower extremity, unspecified laterality - Plan: doxycycline (VIBRA-TABS) 100 MG tablet I explained that I thought he indeed did not do enough damage to cause a cellulitis.  I will place him on doxycycline.  Explained that this might take at least 2 weeks to get better.  He is to keep his feet elevated as much as possible.  He will call if he is not totally back to normal when he finishes this.  He was comfortable with that.

## 2022-01-02 ENCOUNTER — Ambulatory Visit: Payer: Medicare Other | Admitting: Family Medicine

## 2022-01-04 ENCOUNTER — Encounter: Payer: Self-pay | Admitting: Family Medicine

## 2022-01-04 ENCOUNTER — Other Ambulatory Visit: Payer: Self-pay

## 2022-01-04 ENCOUNTER — Telehealth (INDEPENDENT_AMBULATORY_CARE_PROVIDER_SITE_OTHER): Payer: Medicare Other | Admitting: Family Medicine

## 2022-01-04 VITALS — Temp 98.0°F | Ht 70.0 in | Wt 140.0 lb

## 2022-01-04 DIAGNOSIS — R051 Acute cough: Secondary | ICD-10-CM

## 2022-01-04 NOTE — Progress Notes (Signed)
° °  Subjective:    Patient ID: Joe Lawrence, male    DOB: 1971/08/16, 50 y.o.   MRN: [id number]  HPI Documentation for virtual audio and video telecommunications through [place] encounter:  The patient was located at home. 2 patient identifiers used.  The provider was located in the office. The patient did consent to this visit and is aware of possible charges through their insurance for this visit. The other persons participating in this telemedicine service were none. Time spent on call was 5 minutes and in review of previous records >20 minutes total for counseling and coordination of care. This virtual service is not related to other E/M service within previous 7 days.  He has a 4-day history of coughing, malaise, nasal congestion and rhinorrhea.  He has been exposed to his mother who he lives with who apparently was diagnosed with RSV.  She was given albuterol.  He did COVID testing recently and  was negative.  Review of Systems     Objective:   Physical Exam  Alert and in no distress otherwise not examined      Assessment & Plan:  Acute cough I explained that this could be RSV but the therapy for it is supportive care.  Recommend Tylenol for the fever aches and pains as well as Robitussin DM.  If he has further questions he can call.  He was comfortable with that.

## 2022-01-10 ENCOUNTER — Telehealth: Payer: Self-pay | Admitting: Family Medicine

## 2022-01-10 DIAGNOSIS — L03119 Cellulitis of unspecified part of limb: Secondary | ICD-10-CM

## 2022-01-10 MED ORDER — DOXYCYCLINE HYCLATE 100 MG PO TABS: 100.0000 mg | ORAL_TABLET | Freq: Two times a day (BID) | ORAL | 0 refills | Status: DC

## 2022-01-10 NOTE — Telephone Encounter (Signed)
Pt called and states that his legs are still hurting and red. His feet are still red  and hurts when he walks  The swelling has went down but not all the way.  Pt uses [place] [place]

## 2022-01-22 ENCOUNTER — Other Ambulatory Visit: Payer: Self-pay | Admitting: Family Medicine

## 2022-01-22 DIAGNOSIS — K219 Gastro-esophageal reflux disease without esophagitis: Secondary | ICD-10-CM

## 2022-01-22 DIAGNOSIS — J4530 Mild persistent asthma, uncomplicated: Secondary | ICD-10-CM

## 2022-01-23 DIAGNOSIS — Z20822 Contact with and (suspected) exposure to covid-19: Secondary | ICD-10-CM | POA: Diagnosis not present

## 2022-01-23 NOTE — Telephone Encounter (Signed)
Joe Lawrence is requesting to fill pt flovent. Please advise [facility]

## 2022-01-24 ENCOUNTER — Telehealth: Payer: Self-pay

## 2022-01-24 DIAGNOSIS — L03119 Cellulitis of unspecified part of limb: Secondary | ICD-10-CM

## 2022-01-24 MED ORDER — DOXYCYCLINE HYCLATE 100 MG PO TABS: 100.0000 mg | ORAL_TABLET | Freq: Two times a day (BID) | ORAL | 0 refills | Status: DC

## 2022-01-24 NOTE — Telephone Encounter (Signed)
Pt. Called to let you know that his cellulitis was almost all better. The redness has gone away but he is still tender to the touch. He said they also get sore after walking for a while sometimes. He wanted to know if that was normal and if he needed another round of anti biotic or not. He has some left for tomorrow then he will be out. He also said his ins. Sent a letter that the doxy would not be covered unless it had a PA done or switch to a different med.

## 2022-02-08 ENCOUNTER — Telehealth: Payer: Self-pay | Admitting: Family Medicine

## 2022-02-08 DIAGNOSIS — L03119 Cellulitis of unspecified part of limb: Secondary | ICD-10-CM

## 2022-02-08 MED ORDER — DOXYCYCLINE HYCLATE 100 MG PO TABS: 100.0000 mg | ORAL_TABLET | Freq: Two times a day (BID) | ORAL | 0 refills | Status: DC

## 2022-02-08 NOTE — Telephone Encounter (Signed)
He called about leg infection Legs no longer red but the pain is still there and wanted to know if he needs refill on doxycycline  Please advise patient  [facility]

## 2022-02-20 ENCOUNTER — Other Ambulatory Visit: Payer: Self-pay | Admitting: Family Medicine

## 2022-02-20 DIAGNOSIS — K219 Gastro-esophageal reflux disease without esophagitis: Secondary | ICD-10-CM

## 2022-02-20 DIAGNOSIS — J4530 Mild persistent asthma, uncomplicated: Secondary | ICD-10-CM

## 2022-02-20 DIAGNOSIS — L03119 Cellulitis of unspecified part of limb: Secondary | ICD-10-CM

## 2022-02-20 NOTE — Telephone Encounter (Signed)
Joe Lawrence is requesting to fill pt doxycycline and flovent please advise [facility]

## 2022-03-09 ENCOUNTER — Other Ambulatory Visit: Payer: Self-pay | Admitting: Family Medicine

## 2022-03-09 DIAGNOSIS — L03119 Cellulitis of unspecified part of limb: Secondary | ICD-10-CM

## 2022-03-11 ENCOUNTER — Ambulatory Visit (HOSPITAL_COMMUNITY)
Admission: RE | Admit: 2022-03-11 | Discharge: 2022-03-11 | Disposition: A | Discharge status: Home / Self Care | Payer: Medicare Other | Attending: Psychiatry | Admitting: Psychiatry

## 2022-03-11 NOTE — H&P (Signed)
Behavioral Health Medical Screening Exam ? ?Joe Lawrence is a 51 y.o. male who presented voluntarily to [facility] accompanied by his mother for complaint of tearfulness, alcohol/drug withdrawal, anxiety and requesting detoxification from the drug and alcohol.  ? ?Patient is evaluated, chart reviewed, and findings shared with the treatment team and discussed with Dr. Dwyane Dee. On assessment today, patient is alert and oriented to person, time, place and situation. He appeared appropriately groomed and agitated when mom attempted to correct him. Speech is clear and coherent though slow with normal volume and pattern. Mood is angry, anxious and irritable. Psychomotor activity is restless.  ? ?Patient reported that he order some medications from the Internet for his anxiety and sleep and taking them in addition to his Konopin and Lexapro.  Patient's Mother found out about all these medications today and brought patient in to be detox at [facility]. Patient denied SI/HI/AVH, suicidal attempt, self injurious behavior, history of abuse, or  access to fire arms. He endorsed adjusting his psychotropic medications by himself, guilt and poor concentration.  ? ?From my evaluation, patient symptoms does not meet an inpatient admission criteria. Patient was encouraged to return to Dr. Toy Care his Psychiatrist for appropriate evaluation and medication adjustment. Several community resources for behavior therapy provided and patient departed [facility] without any incident. ? ? ?Total Time spent with patient: 30 minutes ? ?Psychiatric Specialty Exam: ? ?Presentation  ?General Appearance: Appropriate for Environment; Casual; Fairly Groomed ? ?Eye Contact:Fair ? ?Speech:Clear and Coherent ? ?Speech Volume:Normal ? ?Handedness:Right ? ?Mood and Affect  ?Mood:Anxious; Depressed; Angry; Irritable ? ?Affect:Blunt; Depressed ? ?Thought Process  ?Thought Processes:Coherent ? ?Descriptions of Associations:Intact ? ?Orientation:Full (Time, Place and  Person) ? ?Thought Content:Logical ? ?History of Schizophrenia/Schizoaffective disorder:No data recorded ?Duration of Psychotic Symptoms:No data recorded ?Hallucinations:Hallucinations: None ? ?Ideas of Reference:None ? ?Suicidal Thoughts:Suicidal Thoughts: No ? ?Homicidal Thoughts:Homicidal Thoughts: No ? ?Sensorium  ?Memory:Immediate Fair; Immediate Poor; Recent Fair ? ?Judgment:Fair ? ?Insight:Fair ? ? ?Executive Functions  ?Concentration:Fair ? ?Attention Span:Fair ? ?Recall:Fair ? ?[place] ? ?Language:Good ? ?Psychomotor Activity  ?Psychomotor Activity:Psychomotor Activity: Restlessness ? ?Assets  ?Assets:Communication Skills; Physical Health; Social Support ? ?Sleep  ?Sleep:Sleep: Good ?Number of Hours of Sleep: 8 ? ?Physical Exam: ?Physical Exam ?Vitals and nursing note reviewed.  ?Constitutional:   ?   Appearance: Normal appearance.  ?HENT:  ?   Head: Normocephalic and atraumatic.  ?   Right Ear: External ear normal.  ?   Left Ear: External ear normal.  ?   Nose: Nose normal.  ?   Mouth/Throat:  ?   Mouth: Mucous membranes are moist.  ?   Pharynx: Oropharynx is clear.  ?Eyes:  ?   Extraocular Movements: Extraocular movements intact.  ?   Conjunctiva/sclera: Conjunctivae normal.  ?   Pupils: Pupils are equal, round, and reactive to light.  ?Cardiovascular:  ?   Rate and Rhythm: Normal rate.  ?   Pulses: Normal pulses.  ?   Comments: BP=155/103 ?Pulmonary:  ?   Effort: Pulmonary effort is normal.  ?Abdominal:  ?   Palpations: Abdomen is soft.  ?Genitourinary: ?   Comments: deferred ?Musculoskeletal:     ?   General: Normal range of motion.  ?   Cervical back: Normal range of motion and neck supple.  ?Skin: ?   General: Skin is warm.  ?Neurological:  ?   General: No focal deficit present.  ?   Mental Status: He is alert and oriented to person, place, and time.  ? ?  Review of Systems  ?Constitutional: Negative.   ?HENT: Negative.    ?Respiratory: Negative.    ?Cardiovascular: Negative.  Negative  for chest pain and palpitations.  ?     BP=155/103  ?Gastrointestinal:  Negative for abdominal pain, constipation, diarrhea, heartburn, nausea and vomiting.  ?Genitourinary: Negative.   ?Skin: Negative.   ?Neurological:  Negative for dizziness, tingling, tremors, sensory change, speech change, focal weakness, seizures, loss of consciousness, weakness and headaches.  ?Endo/Heme/Allergies:   ?     Celexa [Citalopram] Celexa [Citalopram]   Not Specified ?Reaction Type:     ?Noted: 12/20/2014    ?Valid Until:     ?Muscle tighness ?Deletion Reason:  ?Chlorpromazine Hcl Chlorpromazine Hcl  Other (See Comments) Not Specified ?Reaction Type: Unspecified    ?Noted: 09/07/2007    ?Valid Until:     ?Pt does not remember this allergy. May be 20 years ago. ?Deletion Reason:  ?Iodine Iodine   Not Specified ?Reaction Type: Allergy    ?Noted:     ?Valid Until:     ?Skin has burning sensation. ?Deletion Reason:  ?Sulfonamide Derivatives Sulfonamide Derivatives   Not Specified ?Reaction Type: Allergy    ?Noted:     ?Valid Until:     ?Patient does not know of this allergy or the reaction. ?Deletion Reason:  ?Voltaren [Diclofenac Sodium] Voltaren [Diclofenac Sodium]   Not Specified ?Reaction Type: Allergy    ?Noted: 07/20/2012    ?Valid Until:     ?voltaren has caused his arm to swell in the past, does not remember when this happened ? ?  ?Psychiatric/Behavioral:  Positive for depression and substance abuse. The patient is nervous/anxious.   ?There were no vitals taken for this visit. There is no height or weight on file to calculate BMI. ? ?Musculoskeletal: ?Strength & Muscle Tone: within normal limits ?Gait & Station: normal ?Patient leans: N/A ? ?Recommendations: ? ?Based on my evaluation the patient does not appear to have an emergency medical condition. ? ?Laretta Bolster, FNP ?03/11/2022, 8:35 PM ? ?

## 2022-03-11 NOTE — Telephone Encounter (Signed)
[facility] is requesting to fill pt doxycycline. I think [facility] filled while you were out. Please advise [place] ?

## 2022-03-13 ENCOUNTER — Telehealth: Payer: Self-pay | Admitting: Family Medicine

## 2022-03-13 NOTE — Telephone Encounter (Signed)
Left message for patient to call back and schedule Medicare Annual Wellness Visit (AWV) either virtually or in office. ?I left my number for patient to call [phone]. ? ?I wanted to see if patient could do AWV with Pamala Hurry prior to his appt with PCP on 03/26/22 ? ?Last AWV ;09/22/19 ? please schedule at anytime with health coach ? ?This should be a 45 minute visit.   ?

## 2022-03-22 ENCOUNTER — Ambulatory Visit (INDEPENDENT_AMBULATORY_CARE_PROVIDER_SITE_OTHER): Payer: Medicare Other

## 2022-03-22 VITALS — Ht 70.0 in | Wt 140.0 lb

## 2022-03-22 DIAGNOSIS — Z0000 Encounter for general adult medical examination without abnormal findings: Secondary | ICD-10-CM
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
## 2022-03-22 NOTE — Patient Instructions (Signed)
Joe Lawrence , ?Thank you for taking time to come for your Medicare Wellness Visit. I appreciate your ongoing commitment to your health goals. Please review the following plan we discussed and let me know if I can assist you in the future.  ? ?Screening recommendations/referrals: ?Colonoscopy: cologuard 10/12/2019, due 10/11/2022 ?Recommended yearly ophthalmology/optometry visit for glaucoma screening and checkup ?Recommended yearly dental visit for hygiene and checkup ? ?Vaccinations: ?Influenza vaccine: completed 09/07/2021, due next flu season ?Pneumococcal vaccine: n/a ?Tdap vaccine: completed 08/14/2021, due 08/15/2031 ?Shingles vaccine: completed   ?Covid-19:  09/07/2021, 04/06/2021, 10/27/2020, 04/17/2020, 03/23/2020 ? ?Advanced directives: Please bring a copy of your POA (Power of Attorney) and/or Living Will to your next appointment.  ? ?Conditions/risks identified: none ? ?Next appointment: Follow up in one year for your annual wellness visit  ? ?Preventive Care 40-64 Years, Male ?Preventive care refers to lifestyle choices and visits with your health care provider that can promote health and wellness. ?What does preventive care include? ?A yearly physical exam. This is also called an annual well check. ?Dental exams once or twice a year. ?Routine eye exams. Ask your health care provider how often you should have your eyes checked. ?Personal lifestyle choices, including: ?Daily care of your teeth and gums. ?Regular physical activity. ?Eating a healthy diet. ?Avoiding tobacco and drug use. ?Limiting alcohol use. ?Practicing safe sex. ?Taking low-dose aspirin every day starting at age 5. ?What happens during an annual well check? ?The services and screenings done by your health care provider during your annual well check will depend on your age, overall health, lifestyle risk factors, and family history of disease. ?Counseling  ?Your health care provider may ask you questions about your: ?Alcohol use. ?Tobacco  use. ?Drug use. ?Emotional well-being. ?Home and relationship well-being. ?Sexual activity. ?Eating habits. ?Work and work Statistician. ?Screening  ?You may have the following tests or measurements: ?Height, weight, and BMI. ?Blood pressure. ?Lipid and cholesterol levels. These may be checked every 5 years, or more frequently if you are over 37 years old. ?Skin check. ?Lung cancer screening. You may have this screening every year starting at age 37 if you have a 30-pack-year history of smoking and currently smoke or have quit within the past 15 years. ?Fecal occult blood test (FOBT) of the stool. You may have this test every year starting at age 88. ?Flexible sigmoidoscopy or colonoscopy. You may have a sigmoidoscopy every 5 years or a colonoscopy every 10 years starting at age 39. ?Prostate cancer screening. Recommendations will vary depending on your family history and other risks. ?Hepatitis C blood test. ?Hepatitis B blood test. ?Sexually transmitted disease (STD) testing. ?Diabetes screening. This is done by checking your blood sugar (glucose) after you have not eaten for a while (fasting). You may have this done every 1-3 years. ?Discuss your test results, treatment options, and if necessary, the need for more tests with your health care provider. ?Vaccines  ?Your health care provider may recommend certain vaccines, such as: ?Influenza vaccine. This is recommended every year. ?Tetanus, diphtheria, and acellular pertussis (Tdap, Td) vaccine. You may need a Td booster every 10 years. ?Zoster vaccine. You may need this after age 66. ?Pneumococcal 13-valent conjugate (PCV13) vaccine. You may need this if you have certain conditions and have not been vaccinated. ?Pneumococcal polysaccharide (PPSV23) vaccine. You may need one or two doses if you smoke cigarettes or if you have certain conditions. ?Talk to your health care provider about which screenings and vaccines you need and how often  you need them. ?This  information is not intended to replace advice given to you by your health care provider. Make sure you discuss any questions you have with your health care provider. ?Document Released: 01/12/2016 Document Revised: 09/04/2016 Document Reviewed: 10/17/2015 ?Elsevier Interactive Patient Education ? 2017 [place]. ? ?Fall Prevention in the Home ?Falls can cause injuries. They can happen to people of all ages. There are many things you can do to make your home safe and to help prevent falls. ?What can I do on the outside of my home? ?Regularly fix the edges of walkways and driveways and fix any cracks. ?Remove anything that might make you trip as you walk through a door, such as a raised step or threshold. ?Trim any bushes or trees on the path to your home. ?Use bright outdoor lighting. ?Clear any walking paths of anything that might make someone trip, such as rocks or tools. ?Regularly check to see if handrails are loose or broken. Make sure that both sides of any steps have handrails. ?Any raised decks and porches should have guardrails on the edges. ?Have any leaves, snow, or ice cleared regularly. ?Use sand or salt on walking paths during winter. ?Clean up any spills in your garage right away. This includes oil or grease spills. ?What can I do in the bathroom? ?Use night lights. ?Install grab bars by the toilet and in the tub and shower. Do not use towel bars as grab bars. ?Use non-skid mats or decals in the tub or shower. ?If you need to sit down in the shower, use a plastic, non-slip stool. ?Keep the floor dry. Clean up any water that spills on the floor as soon as it happens. ?Remove soap buildup in the tub or shower regularly. ?Attach bath mats securely with double-sided non-slip rug tape. ?Do not have throw rugs and other things on the floor that can make you trip. ?What can I do in the bedroom? ?Use night lights. ?Make sure that you have a light by your bed that is easy to reach. ?Do not use any sheets or  blankets that are too big for your bed. They should not hang down onto the floor. ?Have a firm chair that has side arms. You can use this for support while you get dressed. ?Do not have throw rugs and other things on the floor that can make you trip. ?What can I do in the kitchen? ?Clean up any spills right away. ?Avoid walking on wet floors. ?Keep items that you use a lot in easy-to-reach places. ?If you need to reach something above you, use a strong step stool that has a grab bar. ?Keep electrical cords out of the way. ?Do not use floor polish or wax that makes floors slippery. If you must use wax, use non-skid floor wax. ?Do not have throw rugs and other things on the floor that can make you trip. ?What can I do with my stairs? ?Do not leave any items on the stairs. ?Make sure that there are handrails on both sides of the stairs and use them. Fix handrails that are broken or loose. Make sure that handrails are as long as the stairways. ?Check any carpeting to make sure that it is firmly attached to the stairs. Fix any carpet that is loose or worn. ?Avoid having throw rugs at the top or bottom of the stairs. If you do have throw rugs, attach them to the floor with carpet tape. ?Make sure that you have a light  switch at the top of the stairs and the bottom of the stairs. If you do not have them, ask someone to add them for you. ?What else can I do to help prevent falls? ?Wear shoes that: ?Do not have high heels. ?Have rubber bottoms. ?Are comfortable and fit you well. ?Are closed at the toe. Do not wear sandals. ?If you use a stepladder: ?Make sure that it is fully opened. Do not climb a closed stepladder. ?Make sure that both sides of the stepladder are locked into place. ?Ask someone to hold it for you, if possible. ?Clearly mark and make sure that you can see: ?Any grab bars or handrails. ?First and last steps. ?Where the edge of each step is. ?Use tools that help you move around (mobility aids) if they are  needed. These include: ?Canes. ?Walkers. ?Scooters. ?Crutches. ?Turn on the lights when you go into a dark area. Replace any light bulbs as soon as they burn out. ?Set up your furniture so you have a clear path. Av

## 2022-03-24 ENCOUNTER — Other Ambulatory Visit: Payer: Self-pay | Admitting: Family Medicine

## 2022-03-24 DIAGNOSIS — L03119 Cellulitis of unspecified part of limb: Secondary | ICD-10-CM

## 2022-03-24 DIAGNOSIS — J4530 Mild persistent asthma, uncomplicated: Secondary | ICD-10-CM

## 2022-03-24 DIAGNOSIS — K219 Gastro-esophageal reflux disease without esophagitis: Secondary | ICD-10-CM

## 2022-03-25 NOTE — Telephone Encounter (Signed)
Are these 3 meds ok to refill? ?

## 2022-03-26 ENCOUNTER — Encounter: Payer: Self-pay | Admitting: Family Medicine

## 2022-03-26 ENCOUNTER — Ambulatory Visit (INDEPENDENT_AMBULATORY_CARE_PROVIDER_SITE_OTHER): Payer: Medicare Other | Admitting: Family Medicine

## 2022-03-26 VITALS — BP 124/72 | HR 76 | Temp 97.7°F | Ht 68.25 in | Wt 145.8 lb

## 2022-03-26 DIAGNOSIS — Z1211 Encounter for screening for malignant neoplasm of colon: Secondary | ICD-10-CM

## 2022-03-26 DIAGNOSIS — L03119 Cellulitis of unspecified part of limb: Secondary | ICD-10-CM | POA: Diagnosis not present

## 2022-03-26 DIAGNOSIS — F429 Obsessive-compulsive disorder, unspecified: Secondary | ICD-10-CM

## 2022-03-26 DIAGNOSIS — J4530 Mild persistent asthma, uncomplicated: Secondary | ICD-10-CM

## 2022-03-26 DIAGNOSIS — J301 Allergic rhinitis due to pollen: Secondary | ICD-10-CM

## 2022-03-26 DIAGNOSIS — F418 Other specified anxiety disorders: Secondary | ICD-10-CM | POA: Diagnosis not present

## 2022-03-26 DIAGNOSIS — J4520 Mild intermittent asthma, uncomplicated: Secondary | ICD-10-CM | POA: Diagnosis not present

## 2022-03-26 DIAGNOSIS — K219 Gastro-esophageal reflux disease without esophagitis: Secondary | ICD-10-CM

## 2022-03-26 MED ORDER — LEVALBUTEROL TARTRATE 45 MCG/ACT IN AERO: 2.0000 | INHALATION_SPRAY | Freq: Four times a day (QID) | RESPIRATORY_TRACT | 12 refills | Status: DC | PRN

## 2022-03-26 MED ORDER — PANTOPRAZOLE SODIUM 40 MG PO TBEC: 40.0000 mg | DELAYED_RELEASE_TABLET | Freq: Two times a day (BID) | ORAL | 3 refills | Status: DC

## 2022-03-26 MED ORDER — FLUTICASONE PROPIONATE HFA 220 MCG/ACT IN AERO: INHALATION_SPRAY | RESPIRATORY_TRACT | 3 refills | Status: DC

## 2022-03-26 NOTE — Progress Notes (Signed)
? ?  Subjective:  ? ? Patient ID: Joe Lawrence, male    DOB: [birth year], 51 y.o.   MRN: [id number] ? ?HPI ?He is here for med check appointment.  He does complain of difficulty with couple of episodes of urinary incontinence as well as occasionally feeling like he cannot completely empty his bladder.  He does drink a lot of fluids and therefore is having some nocturia according to him.  No hesitancy or decreased stream.  The leg seems to be clearing up and he is having only minor irritation in that area.  He continues to be followed by Dr.Kaur and was recently switched to Klonopin.  He has been treated recently for cellulitis and seems to have done quite nicely with that.  He apparently was taking some over-the-counter medications to help with the anxiety.  He continues on Protonix for his reflux which is working.  His asthma is under fairly good control with Flovent but he would like a refill on his Xopenex. ?He is on disability and does live with his mother.  Health maintenance, immunizations and family history reviewed. ? ?Review of Systems ? ?   ?Objective:  ? Physical Exam ?Alert and in no distress. Tympanic membranes and canals are normal. Pharyngeal area is normal. Neck is supple without adenopathy or thyromegaly. Cardiac exam shows a regular sinus rhythm without murmurs or gallops. Lungs are clear to auscultation.  Abdominal exam shows no masses or tenderness.  Genitalia normal circumcised male.  Testes normal.  Rectal exam does show relatively normal prostate that was nontender. ?Exam of the left lower extremity shows no erythema warmth or tenderness. ? ? ? ?   ?Assessment & Plan:  ?Obsessive-compulsive disorder, unspecified type ? ?Gastroesophageal reflux disease without esophagitis - Plan: pantoprazole (PROTONIX) 40 MG tablet ? ?Chronic seasonal allergic rhinitis due to pollen ? ?Mild persistent asthma without complication - Plan: fluticasone (FLOVENT HFA) 220 MCG/ACT inhaler ? ?Mild intermittent  extrinsic asthma without complication - Plan: levalbuterol (XOPENEX HFA) 45 MCG/ACT inhaler ? ?Screening for colon cancer - Plan: Cologuard ? ?Cellulitis of lower extremity, unspecified laterality ? ?Depression with anxiety ?We will continue on his present medication regimen.  Continue to follow-up with psychiatry.  Discussed the bladder related symptoms and possible referral to urology for more definitive care but he is willing to wait on this.  Explained that when this becomes more of an issue we can certainly pursue it.  He will not need to be on any more antibiotic for the cellulitis. ? ?

## 2022-07-17 ENCOUNTER — Ambulatory Visit: Payer: Medicare Other | Admitting: Family Medicine

## 2022-07-22 ENCOUNTER — Ambulatory Visit (INDEPENDENT_AMBULATORY_CARE_PROVIDER_SITE_OTHER): Payer: Medicare Other | Admitting: Family Medicine

## 2022-07-22 VITALS — BP 110/70 | HR 57 | Temp 97.3°F | Wt 161.6 lb

## 2022-07-22 DIAGNOSIS — R32 Unspecified urinary incontinence: Secondary | ICD-10-CM | POA: Diagnosis not present

## 2022-07-22 DIAGNOSIS — R609 Edema, unspecified: Secondary | ICD-10-CM | POA: Diagnosis not present

## 2022-07-22 DIAGNOSIS — R748 Abnormal levels of other serum enzymes: Secondary | ICD-10-CM

## 2022-07-22 NOTE — Progress Notes (Signed)
   Subjective:    Patient ID: Joe Lawrence, male    DOB: November 05, 1971, 51 y.o.   MRN: [id number]  HPI He is here for evaluation of bilateral lower extremity swelling and pain.  He has a previous history of possible cellulitis and treatment and thinks he might have this again.  He does note that the swelling gets worse as the day progresses.  He does wear support hose and states that this does help.  He has not complained of chest pain, shortness of breath.  His medications were reviewed.   Review of Systems     Objective:   Physical Exam Alert and in no distress.  Skin appears normal.  It is not hot red or tender.  Negative Homans' sign.  Distal pulses are normal.  1-2+ pitting edema is noted.       Assessment & Plan:  Dependent edema - Plan: CBC with Differential/Platelet, Comprehensive metabolic panel I will also go ahead and refer him to Dr. Jones Skene to further evaluate his vascular status. At the end of the encounter he then mentioned the fact that he still does intermittently have urinary incontinence.  When I discussed possible intervention, he decided to wait.  I explained that I did not feel comfortable placing him on medication and would want to refer to urology.

## 2022-07-23 LAB — COMPREHENSIVE METABOLIC PANEL
ALT: 150 IU/L — ABNORMAL HIGH (ref 0–44)
AST: 86 IU/L — ABNORMAL HIGH (ref 0–40)
Albumin/Globulin Ratio: 1.9 (ref 1.2–2.2)
Albumin: 4 g/dL (ref 3.8–4.9)
Alkaline Phosphatase: 77 IU/L (ref 44–121)
BUN/Creatinine Ratio: 8 — ABNORMAL LOW (ref 9–20)
BUN: 8 mg/dL (ref 6–24)
Bilirubin Total: 0.3 mg/dL (ref 0.0–1.2)
CO2: 26 mmol/L (ref 20–29)
Calcium: 8.5 mg/dL — ABNORMAL LOW (ref 8.7–10.2)
Chloride: 102 mmol/L (ref 96–106)
Creatinine, Ser: 0.95 mg/dL (ref 0.76–1.27)
Globulin, Total: 2.1 g/dL (ref 1.5–4.5)
Glucose: 103 mg/dL — ABNORMAL HIGH (ref 70–99)
Potassium: 4.4 mmol/L (ref 3.5–5.2)
Sodium: 141 mmol/L (ref 134–144)
Total Protein: 6.1 g/dL (ref 6.0–8.5)
eGFR: 96 mL/min/{1.73_m2} (ref >59)

## 2022-07-23 LAB — CBC WITH DIFFERENTIAL/PLATELET
Basophils Absolute: 0 10*3/uL (ref 0.0–0.2)
Basos: 1 %
EOS (ABSOLUTE): 0 10*3/uL (ref 0.0–0.4)
Eos: 0 %
Hematocrit: 35.2 % — ABNORMAL LOW (ref 37.5–51.0)
Hemoglobin: 11.8 g/dL — ABNORMAL LOW (ref 13.0–17.7)
Immature Grans (Abs): 0 10*3/uL (ref 0.0–0.1)
Immature Granulocytes: 0 %
Lymphocytes Absolute: 0.8 10*3/uL (ref 0.7–3.1)
Lymphs: 27 %
MCH: 29.4 pg (ref 26.6–33.0)
MCHC: 33.5 g/dL (ref 31.5–35.7)
MCV: 88 fL (ref 79–97)
Monocytes Absolute: 0.8 10*3/uL (ref 0.1–0.9)
Monocytes: 24 %
Neutrophils Absolute: 1.5 10*3/uL (ref 1.4–7.0)
Neutrophils: 48 %
Platelets: 297 10*3/uL (ref 150–450)
RBC: 4.02 x10E6/uL — ABNORMAL LOW (ref 4.14–5.80)
RDW: 12.8 % (ref 11.6–15.4)
WBC: 3.1 10*3/uL — ABNORMAL LOW (ref 3.4–10.8)

## 2022-07-23 NOTE — Addendum Note (Signed)
Addended by: Denita Lung on: 07/23/2022 08:24 AM   Modules accepted: Orders

## 2022-07-24 ENCOUNTER — Other Ambulatory Visit: Payer: Medicare Other

## 2022-07-24 DIAGNOSIS — R748 Abnormal levels of other serum enzymes: Secondary | ICD-10-CM

## 2022-07-24 DIAGNOSIS — R945 Abnormal results of liver function studies: Secondary | ICD-10-CM | POA: Diagnosis not present

## 2022-07-25 LAB — ACUTE HEP PANEL AND HEP B SURFACE AB
Hep A IgM: NEGATIVE
Hep B C IgM: NEGATIVE
Hep C Virus Ab: NONREACTIVE
Hepatitis B Surf Ab Quant: <3.1 m[IU]/mL — ABNORMAL LOW (ref >9.9)
Hepatitis B Surface Ag: NEGATIVE

## 2022-08-15 DIAGNOSIS — R600 Localized edema: Secondary | ICD-10-CM | POA: Diagnosis not present

## 2022-08-15 DIAGNOSIS — I890 Lymphedema, not elsewhere classified: Secondary | ICD-10-CM | POA: Diagnosis not present

## 2022-08-15 DIAGNOSIS — M79661 Pain in right lower leg: Secondary | ICD-10-CM | POA: Diagnosis not present

## 2022-08-15 DIAGNOSIS — M79662 Pain in left lower leg: Secondary | ICD-10-CM | POA: Diagnosis not present

## 2022-09-04 ENCOUNTER — Encounter: Payer: Self-pay | Admitting: Internal Medicine

## 2022-09-10 DIAGNOSIS — I87393 Chronic venous hypertension (idiopathic) with other complications of bilateral lower extremity: Secondary | ICD-10-CM | POA: Diagnosis not present

## 2022-09-12 DIAGNOSIS — I890 Lymphedema, not elsewhere classified: Secondary | ICD-10-CM | POA: Diagnosis not present

## 2022-09-12 DIAGNOSIS — R600 Localized edema: Secondary | ICD-10-CM | POA: Diagnosis not present

## 2022-09-12 DIAGNOSIS — I872 Venous insufficiency (chronic) (peripheral): Secondary | ICD-10-CM | POA: Diagnosis not present

## 2022-09-20 NOTE — Progress Notes (Signed)
Will send pt  U/S duplex results in my chart. [place]

## 2022-09-24 DIAGNOSIS — Z23 Encounter for immunization: Secondary | ICD-10-CM | POA: Diagnosis not present

## 2022-09-28 DIAGNOSIS — Z23 Encounter for immunization: Secondary | ICD-10-CM | POA: Diagnosis not present

## 2022-10-21 ENCOUNTER — Encounter: Payer: Self-pay | Admitting: Internal Medicine

## 2023-02-13 ENCOUNTER — Encounter (HOSPITAL_COMMUNITY): Payer: Self-pay

## 2023-02-13 ENCOUNTER — Other Ambulatory Visit: Payer: Self-pay

## 2023-02-13 ENCOUNTER — Inpatient Hospital Stay (HOSPITAL_COMMUNITY)
Admission: AD | Admit: 2023-02-13 | Discharge: 2023-02-25 | DRG: 885 | Disposition: A | Discharge status: Home / Self Care | Payer: Medicare Other | Source: Intra-hospital | Attending: Psychiatry | Admitting: Psychiatry

## 2023-02-13 ENCOUNTER — Encounter (HOSPITAL_COMMUNITY): Payer: Self-pay | Admitting: Psychiatry

## 2023-02-13 ENCOUNTER — Emergency Department (HOSPITAL_COMMUNITY)
Admission: EM | Admit: 2023-02-13 | Discharge: 2023-02-13 | Disposition: A | Discharge status: Other Acute Inpatient Hospital | Payer: Medicare Other | Source: Home / Self Care | Attending: Emergency Medicine | Admitting: Emergency Medicine

## 2023-02-13 DIAGNOSIS — F411 Generalized anxiety disorder: Secondary | ICD-10-CM | POA: Diagnosis present

## 2023-02-13 DIAGNOSIS — F322 Major depressive disorder, single episode, severe without psychotic features: Principal | ICD-10-CM | POA: Diagnosis present

## 2023-02-13 DIAGNOSIS — Z20822 Contact with and (suspected) exposure to covid-19: Secondary | ICD-10-CM | POA: Diagnosis present

## 2023-02-13 DIAGNOSIS — J45909 Unspecified asthma, uncomplicated: Secondary | ICD-10-CM | POA: Insufficient documentation

## 2023-02-13 DIAGNOSIS — G4700 Insomnia, unspecified: Secondary | ICD-10-CM | POA: Diagnosis present

## 2023-02-13 DIAGNOSIS — K219 Gastro-esophageal reflux disease without esophagitis: Secondary | ICD-10-CM | POA: Diagnosis present

## 2023-02-13 DIAGNOSIS — T71162A Asphyxiation due to hanging, intentional self-harm, initial encounter: Secondary | ICD-10-CM | POA: Diagnosis present

## 2023-02-13 DIAGNOSIS — F13239 Sedative, hypnotic or anxiolytic dependence with withdrawal, unspecified: Secondary | ICD-10-CM | POA: Diagnosis present

## 2023-02-13 DIAGNOSIS — Z23 Encounter for immunization: Secondary | ICD-10-CM

## 2023-02-13 DIAGNOSIS — Z87891 Personal history of nicotine dependence: Secondary | ICD-10-CM | POA: Insufficient documentation

## 2023-02-13 DIAGNOSIS — K30 Functional dyspepsia: Secondary | ICD-10-CM | POA: Diagnosis present

## 2023-02-13 DIAGNOSIS — R9431 Abnormal electrocardiogram [ECG] [EKG]: Secondary | ICD-10-CM | POA: Diagnosis not present

## 2023-02-13 DIAGNOSIS — F1120 Opioid dependence, uncomplicated: Secondary | ICD-10-CM | POA: Insufficient documentation

## 2023-02-13 DIAGNOSIS — Z7951 Long term (current) use of inhaled steroids: Secondary | ICD-10-CM | POA: Diagnosis not present

## 2023-02-13 DIAGNOSIS — K861 Other chronic pancreatitis: Secondary | ICD-10-CM | POA: Diagnosis present

## 2023-02-13 DIAGNOSIS — T1491XA Suicide attempt, initial encounter: Secondary | ICD-10-CM | POA: Diagnosis not present

## 2023-02-13 DIAGNOSIS — H1010 Acute atopic conjunctivitis, unspecified eye: Secondary | ICD-10-CM | POA: Diagnosis present

## 2023-02-13 DIAGNOSIS — I10 Essential (primary) hypertension: Secondary | ICD-10-CM | POA: Diagnosis present

## 2023-02-13 DIAGNOSIS — Z79899 Other long term (current) drug therapy: Secondary | ICD-10-CM

## 2023-02-13 DIAGNOSIS — Z9151 Personal history of suicidal behavior: Secondary | ICD-10-CM

## 2023-02-13 DIAGNOSIS — D229 Melanocytic nevi, unspecified: Secondary | ICD-10-CM

## 2023-02-13 DIAGNOSIS — R45851 Suicidal ideations: Secondary | ICD-10-CM | POA: Diagnosis present

## 2023-02-13 DIAGNOSIS — K5900 Constipation, unspecified: Secondary | ICD-10-CM | POA: Diagnosis present

## 2023-02-13 DIAGNOSIS — F1320 Sedative, hypnotic or anxiolytic dependence, uncomplicated: Secondary | ICD-10-CM | POA: Diagnosis present

## 2023-02-13 LAB — RAPID URINE DRUG SCREEN, HOSP PERFORMED
Amphetamines: NOT DETECTED
Barbiturates: NOT DETECTED
Benzodiazepines: NOT DETECTED
Cocaine: NOT DETECTED
Opiates: NOT DETECTED
Tetrahydrocannabinol: POSITIVE — AB

## 2023-02-13 LAB — CBC WITH DIFFERENTIAL/PLATELET
Abs Immature Granulocytes: 0.01 10*3/uL (ref 0.00–0.07)
Basophils Absolute: 0 10*3/uL (ref 0.0–0.1)
Basophils Relative: 0 %
Eosinophils Absolute: 0 10*3/uL (ref 0.0–0.5)
Eosinophils Relative: 0 %
HCT: 39.2 % (ref 39.0–52.0)
Hemoglobin: 12.7 g/dL — ABNORMAL LOW (ref 13.0–17.0)
Immature Granulocytes: 0 %
Lymphocytes Relative: 20 %
Lymphs Abs: 0.9 10*3/uL (ref 0.7–4.0)
MCH: 28.9 pg (ref 26.0–34.0)
MCHC: 32.4 g/dL (ref 30.0–36.0)
MCV: 89.3 fL (ref 80.0–100.0)
Monocytes Absolute: 0.5 10*3/uL (ref 0.1–1.0)
Monocytes Relative: 12 %
Neutro Abs: 3.1 10*3/uL (ref 1.7–7.7)
Neutrophils Relative %: 68 %
Platelets: 357 10*3/uL (ref 150–400)
RBC: 4.39 MIL/uL (ref 4.22–5.81)
RDW: 13.2 % (ref 11.5–15.5)
WBC: 4.6 10*3/uL (ref 4.0–10.5)
nRBC: 0 % (ref 0.0–0.2)

## 2023-02-13 LAB — COMPREHENSIVE METABOLIC PANEL
ALT: 346 U/L — ABNORMAL HIGH (ref 0–44)
AST: 247 U/L — ABNORMAL HIGH (ref 15–41)
Albumin: 4.7 g/dL (ref 3.5–5.0)
Alkaline Phosphatase: 56 U/L (ref 38–126)
Anion gap: 13 (ref 5–15)
BUN: 9 mg/dL (ref 6–20)
CO2: 24 mmol/L (ref 22–32)
Calcium: 9.1 mg/dL (ref 8.9–10.3)
Chloride: 93 mmol/L — ABNORMAL LOW (ref 98–111)
Creatinine, Ser: 0.75 mg/dL (ref 0.61–1.24)
GFR, Estimated: >60 mL/min (ref >60)
Glucose, Bld: 100 mg/dL — ABNORMAL HIGH (ref 70–99)
Potassium: 3.6 mmol/L (ref 3.5–5.1)
Sodium: 130 mmol/L — ABNORMAL LOW (ref 135–145)
Total Bilirubin: 1.1 mg/dL (ref 0.3–1.2)
Total Protein: 7.5 g/dL (ref 6.5–8.1)

## 2023-02-13 LAB — ETHANOL: Alcohol, Ethyl (B): <10 mg/dL (ref <10)

## 2023-02-13 LAB — ACETAMINOPHEN LEVEL: Acetaminophen (Tylenol), Serum: <10 ug/mL — ABNORMAL LOW (ref 10–30)

## 2023-02-13 LAB — SALICYLATE LEVEL: Salicylate Lvl: <7 mg/dL — ABNORMAL LOW (ref 7.0–30.0)

## 2023-02-13 LAB — RESP PANEL BY RT-PCR (RSV, FLU A&B, COVID)  RVPGX2
Influenza A by PCR: NEGATIVE
Influenza B by PCR: NEGATIVE
Resp Syncytial Virus by PCR: NEGATIVE
SARS Coronavirus 2 by RT PCR: NEGATIVE

## 2023-02-13 MED ORDER — PNEUMOCOCCAL 20-VAL CONJ VACC 0.5 ML IM SUSY
0.5000 mL | PREFILLED_SYRINGE | INTRAMUSCULAR | Status: AC
  Administered 2023-02-14: 0.5 mL via INTRAMUSCULAR
  Filled 2023-02-13: qty 0.5

## 2023-02-13 MED ORDER — GABAPENTIN 300 MG PO CAPS
300.0000 mg | ORAL_CAPSULE | Freq: Three times a day (TID) | ORAL | Status: DC
  Administered 2023-02-13 (×2): 300 mg via ORAL
  Filled 2023-02-13 (×2): qty 1

## 2023-02-13 MED ORDER — THIAMINE HCL 100 MG/ML IJ SOLN: 100.0000 mg | Freq: Once | INTRAMUSCULAR | Status: DC

## 2023-02-13 MED ORDER — ONDANSETRON 4 MG PO TBDP: 4.0000 mg | ORAL_TABLET | Freq: Four times a day (QID) | ORAL | Status: DC | PRN

## 2023-02-13 MED ORDER — ADULT MULTIVITAMIN W/MINERALS CH
1.0000 | ORAL_TABLET | Freq: Every day | ORAL | Status: DC
  Administered 2023-02-14: 1 via ORAL
  Filled 2023-02-13 (×4): qty 1

## 2023-02-13 MED ORDER — HYDROXYZINE HCL 25 MG PO TABS
25.0000 mg | ORAL_TABLET | Freq: Four times a day (QID) | ORAL | Status: DC | PRN
  Administered 2023-02-13 – 2023-02-14 (×2): 25 mg via ORAL
  Filled 2023-02-13 (×2): qty 1

## 2023-02-13 MED ORDER — LORAZEPAM 1 MG PO TABS
1.0000 mg | ORAL_TABLET | Freq: Four times a day (QID) | ORAL | Status: DC | PRN
  Administered 2023-02-13 – 2023-02-14 (×3): 1 mg via ORAL
  Filled 2023-02-13 (×3): qty 1

## 2023-02-13 MED ORDER — THIAMINE MONONITRATE 100 MG PO TABS
100.0000 mg | ORAL_TABLET | Freq: Every day | ORAL | Status: DC
  Administered 2023-02-13: 100 mg via ORAL
  Filled 2023-02-13: qty 1

## 2023-02-13 MED ORDER — LORAZEPAM 1 MG PO TABS: 0.0000 mg | ORAL_TABLET | Freq: Two times a day (BID) | ORAL | Status: DC

## 2023-02-13 MED ORDER — LORAZEPAM 1 MG PO TABS
0.0000 mg | ORAL_TABLET | Freq: Four times a day (QID) | ORAL | Status: DC
  Administered 2023-02-13: 1 mg via ORAL
  Filled 2023-02-13: qty 1

## 2023-02-13 MED ORDER — THIAMINE HCL 100 MG/ML IJ SOLN
100.0000 mg | Freq: Every day | INTRAMUSCULAR | Status: DC
  Filled 2023-02-13: qty 2

## 2023-02-13 MED ORDER — GABAPENTIN 300 MG PO CAPS
600.0000 mg | ORAL_CAPSULE | Freq: Four times a day (QID) | ORAL | Status: DC
  Administered 2023-02-14 – 2023-02-25 (×46): 600 mg via ORAL
  Filled 2023-02-13 (×53): qty 2

## 2023-02-13 MED ORDER — VITAMIN B-1 100 MG PO TABS
100.0000 mg | ORAL_TABLET | Freq: Every day | ORAL | Status: DC
  Administered 2023-02-14: 100 mg via ORAL
  Filled 2023-02-13 (×3): qty 1

## 2023-02-13 MED ORDER — LOPERAMIDE HCL 2 MG PO CAPS: 2.0000 mg | ORAL_CAPSULE | ORAL | Status: DC | PRN

## 2023-02-13 MED ORDER — LORAZEPAM 2 MG/ML IJ SOLN
0.0000 mg | Freq: Four times a day (QID) | INTRAMUSCULAR | Status: DC
  Administered 2023-02-13: 2 mg via INTRAVENOUS
  Filled 2023-02-13: qty 1

## 2023-02-13 MED ORDER — ESCITALOPRAM OXALATE 10 MG PO TABS: 20.0000 mg | ORAL_TABLET | Freq: Every day | ORAL | Status: DC

## 2023-02-13 MED ORDER — ALUM & MAG HYDROXIDE-SIMETH 200-200-20 MG/5ML PO SUSP: 30.0000 mL | ORAL | Status: DC | PRN

## 2023-02-13 MED ORDER — MAGNESIUM HYDROXIDE 400 MG/5ML PO SUSP: 30.0000 mL | Freq: Every day | ORAL | Status: DC | PRN

## 2023-02-13 MED ORDER — ACETAMINOPHEN 325 MG PO TABS
650.0000 mg | ORAL_TABLET | Freq: Four times a day (QID) | ORAL | Status: DC | PRN
  Administered 2023-02-13 – 2023-02-25 (×13): 650 mg via ORAL
  Filled 2023-02-13 (×13): qty 2

## 2023-02-13 MED ORDER — LORAZEPAM 2 MG/ML IJ SOLN: 0.0000 mg | Freq: Two times a day (BID) | INTRAMUSCULAR | Status: DC

## 2023-02-13 NOTE — ED Notes (Signed)
Report received from shayla rn. Patient sitting on stretcher NAD. Calm and cooperative

## 2023-02-13 NOTE — ED Notes (Signed)
Pt's belonging's bag is labeled and placed in locker 28

## 2023-02-13 NOTE — ED Provider Notes (Signed)
[facility] Provider Note   CSN: [id number] Arrival date & time: 02/13/23  0554     History Chief Complaint  Patient presents with   Suicide Attempt   Withdrawal    HPI Sakariye Yun is a 52 y.o. male presenting for chief complaint of detox and suicide attempt.  He states that he has a history of heavy alcohol use, polysubstance use and recently relapsed after 8 years sober.  States that he was feeling overwhelmed and he tried to hang himself last night.  Patient's mother identified self-harm attempt and called EMS. Patient endorses multiple substances used per day.  He has been compliant on his Lexapro and gabapentin.  He is ambulatory tolerating p.o. intake at this time.  Denies syncope..   Patient's recorded medical, surgical, social, medication list and allergies were reviewed in the Snapshot window as part of the initial history.   Review of Systems   Review of Systems  Constitutional:  Negative for chills and fever.  HENT:  Negative for ear pain and sore throat.   Eyes:  Negative for pain and visual disturbance.  Respiratory:  Negative for cough and shortness of breath.   Cardiovascular:  Negative for chest pain and palpitations.  Gastrointestinal:  Negative for abdominal pain and vomiting.  Genitourinary:  Negative for dysuria and hematuria.  Musculoskeletal:  Negative for arthralgias and back pain.  Skin:  Negative for color change and rash.  Neurological:  Negative for seizures and syncope.  Psychiatric/Behavioral:  Positive for self-injury, sleep disturbance and suicidal ideas.   All other systems reviewed and are negative.   Physical Exam Updated Vital Signs BP (P) 131/79 (BP Location: Right Arm)   Pulse (P) 71   Temp (P) 98.1 F (36.7 C) (Oral)   Resp (P) 18   Ht 5' 8"$  (1.727 m)   Wt 73 kg   SpO2 (P) 100%   BMI 24.47 kg/m  Physical Exam Vitals and nursing note reviewed.  Constitutional:       General: He is not in acute distress.    Appearance: He is well-developed.  HENT:     Head: Normocephalic and atraumatic.  Eyes:     Conjunctiva/sclera: Conjunctivae normal.  Neck:     Comments: Negative carotid bruit. Cardiovascular:     Rate and Rhythm: Normal rate and regular rhythm.     Heart sounds: No murmur heard. Pulmonary:     Effort: Pulmonary effort is normal. No respiratory distress.     Breath sounds: Normal breath sounds.  Abdominal:     Palpations: Abdomen is soft.     Tenderness: There is no abdominal tenderness.  Musculoskeletal:        General: No swelling.     Cervical back: Neck supple.  Skin:    General: Skin is warm and dry.     Capillary Refill: Capillary refill takes less than 2 seconds.  Neurological:     Mental Status: He is alert.  Psychiatric:        Mood and Affect: Mood normal.      ED Course/ Medical Decision Making/ A&P    Procedures Procedures   Medications Ordered in ED Medications - No data to display  Medical Decision Making:    Floren Muraski is a 52 y.o. male who presented to the ED today with self-harm by hanging detailed above.     Patient's presentation is complicated by their history of multiple comorbid medical problems.  Patient placed  on continuous vitals and telemetry monitoring while in ED which was reviewed periodically.   Complete initial physical exam performed, notably the patient  was shaky, agitated with pressured speech.  No bruising identified to the neck or carotid bruit nor expanding hematoma.     Reviewed and confirmed nursing documentation for past medical history, family history, social history.    Initial Assessment:   Concerning patient's self-harm attempt by hanging.  He does not meet [place] criteria for further diagnostic evaluation.  He is overall well-appearing and is medically cleared from his hanging attempt at this time. Concerning patient's underlying psychiatric disease.  He is  endorsing plan for repetitive self-harm due to his underlying social and substance use problems.  He is here voluntarily and warrants evaluation by psychiatric providers for further care planning. Medical screening exam performed and patient is medically cleared for psychiatric disposition with reassuring lab work without evidence of acute pathology Additionally, patient has a history of heavy alcohol use and has elevated CIWA of approximately 10 per nurse.  Patient placed on CIWA protocol pending psychiatric recommendations. While pending psychiatric evaluation, patient placed in the psychiatric holding protocol, home medications and diet were ordered as well as ongoing monitoring and safety precautions.  Psychiatry recommended admission for further psychiatric care and management. Patient medically cleared at this time. Disposition:   Based on the above findings, I believe this patient is stable for admission.    Patient/family educated about specific findings on our evaluation and explained exact reasons for admission.  Patient/family educated about clinical situation and time was allowed to answer questions.   Admission team communicated with and agreed with need for admission. Patient admitted. Patient  ready to move at this time.     Emergency Department Medication Summary:   Medications - No data to display      Clinical Impression:  1. Suicide attempt ([place])   2. Suicidal ideation      Admit to [facility]   Final Clinical Impression(s) / ED Diagnoses Final diagnoses:  Suicide attempt [facility])  Suicidal ideation    Rx / DC Orders ED Discharge Orders     None         Tretha Sciara, MD 02/13/23 1709
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
## 2023-02-13 NOTE — Progress Notes (Signed)
Pt was accepted to [facility] 02/13/23; Bed Assignment 403-2  Pt meets inpatient criteria per Vivi Martens, [place]  Attending Physician will be Dr. Janine Limbo  Report can be called to: Adult unit: [phone]  Pt can arrive: BED IS [place] Team notified: [employer] Scharlene Gloss, RN, Vivi Martens, PMHNP, [place], [place], Tretha Sciara, MD, Maryln Manuel, RN   [place], [place] 02/13/2023 @ 7:19 PM

## 2023-02-13 NOTE — Tx Team (Signed)
Initial Treatment Plan 02/13/2023 5:57 PM Joe Lawrence [id number]    PATIENT STRESSORS: Substance abuse     PATIENT STRENGTHS: Communication skills  General fund of knowledge  Motivation for treatment/growth  Supportive family/friends    PATIENT IDENTIFIED PROBLEMS: "Substance abuse"  "Depression"  "Medication management"                  DISCHARGE CRITERIA:  Reduction of life-threatening or endangering symptoms to within safe limits  PRELIMINARY DISCHARGE PLAN: Return to previous living arrangement  PATIENT/FAMILY INVOLVEMENT: This treatment plan has been presented to and reviewed with the patient, Joe Lawrence.  The patient has been given the opportunity to ask questions and make suggestions.  Noel Christmas, RN 02/13/2023, 5:57 PM

## 2023-02-13 NOTE — Progress Notes (Signed)
   02/13/23 2200  Psych Admission Type (Psych Patients Only)  Admission Status Voluntary  Psychosocial Assessment  Patient Complaints Anxiety;Shakiness;Substance abuse  Eye Contact Fair  Facial Expression Anxious  Affect Anxious  Speech Incoherent  Interaction Assertive  Motor Activity Tremors  Appearance/Hygiene Unremarkable  Behavior Characteristics Cooperative;Appropriate to situation;Anxious  Mood Anxious;Depressed  Thought Process  Coherency WDL  Content Blaming self  Delusions None reported or observed  Perception WDL  Hallucination None reported or observed  Judgment Poor  Confusion None  Danger to Self  Current suicidal ideation? Denies  Agreement Not to Harm Self Yes  Description of Agreement verbal  Danger to Others  Danger to Others None reported or observed
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
## 2023-02-13 NOTE — BHH Group Notes (Signed)
Pt attended wrap up group 

## 2023-02-13 NOTE — ED Notes (Signed)
Consent form faxed to [facility]

## 2023-02-13 NOTE — ED Triage Notes (Addendum)
Pt arrives by [employer] from home for Suicidal attempt x2. PD endorses pt's mother states pt tried to hang himself with a belt at the end of his bed tonight.   Voluntary.  Detox- Klonopin (last used 5 days ago) not taking medication as prescribed - ran out early - MD would not prescribe more. Kratom (opiod sold on the internet) last used 4 hours ago.  ETOH - last used just prior to arrival.  Nausea and emesis.

## 2023-02-13 NOTE — Progress Notes (Signed)
Pt was accepted to [facility] 02/13/2023. Bed assignment: [id number]  Pt meets inpatient criteria per Michaele Offer, NP  Attending Physician will be Janine Limbo, MD  Report can be called to: - Adult unit: [phone]  Pt can arrive; [facility] to coordinate arrival time  Care Team Notified: [facility] [address] Scharlene Gloss, RN, Maryln Manuel, RN, and Michaele Offer, NP  [place], [place]  02/13/2023 3:32 PM

## 2023-02-14 ENCOUNTER — Encounter (HOSPITAL_COMMUNITY): Payer: Self-pay

## 2023-02-14 DIAGNOSIS — F322 Major depressive disorder, single episode, severe without psychotic features: Secondary | ICD-10-CM | POA: Diagnosis not present

## 2023-02-14 DIAGNOSIS — F411 Generalized anxiety disorder: Secondary | ICD-10-CM | POA: Diagnosis present

## 2023-02-14 DIAGNOSIS — F1320 Sedative, hypnotic or anxiolytic dependence, uncomplicated: Secondary | ICD-10-CM

## 2023-02-14 HISTORY — DX: Sedative, hypnotic or anxiolytic dependence, uncomplicated: F13.20

## 2023-02-14 LAB — LIPID PANEL
Cholesterol: 256 mg/dL — ABNORMAL HIGH (ref 0–200)
HDL: 75 mg/dL (ref >40)
LDL Cholesterol: 159 mg/dL — ABNORMAL HIGH (ref 0–99)
Total CHOL/HDL Ratio: 3.4 RATIO
Triglycerides: 110 mg/dL (ref <150)
VLDL: 22 mg/dL (ref 0–40)

## 2023-02-14 LAB — CBC
HCT: 39.3 % (ref 39.0–52.0)
Hemoglobin: 12.8 g/dL — ABNORMAL LOW (ref 13.0–17.0)
MCH: 29.2 pg (ref 26.0–34.0)
MCHC: 32.6 g/dL (ref 30.0–36.0)
MCV: 89.5 fL (ref 80.0–100.0)
Platelets: 284 10*3/uL (ref 150–400)
RBC: 4.39 MIL/uL (ref 4.22–5.81)
RDW: 13.3 % (ref 11.5–15.5)
WBC: 4 10*3/uL (ref 4.0–10.5)
nRBC: 0 % (ref 0.0–0.2)

## 2023-02-14 LAB — TSH: TSH: 1.867 u[IU]/mL (ref 0.350–4.500)

## 2023-02-14 LAB — HEMOGLOBIN A1C
Hgb A1c MFr Bld: 5.1 % (ref 4.8–5.6)
Mean Plasma Glucose: 99.67 mg/dL

## 2023-02-14 MED ORDER — LORAZEPAM 1 MG PO TABS
1.0000 mg | ORAL_TABLET | Freq: Every day | ORAL | Status: AC
  Administered 2023-02-18: 1 mg via ORAL
  Filled 2023-02-14: qty 1

## 2023-02-14 MED ORDER — LORAZEPAM 1 MG PO TABS
1.0000 mg | ORAL_TABLET | Freq: Three times a day (TID) | ORAL | Status: AC
  Administered 2023-02-16 (×3): 1 mg via ORAL
  Filled 2023-02-14 (×3): qty 1

## 2023-02-14 MED ORDER — LOPERAMIDE HCL 2 MG PO CAPS: 2.0000 mg | ORAL_CAPSULE | ORAL | Status: AC | PRN

## 2023-02-14 MED ORDER — PANCRELIPASE (LIP-PROT-AMYL) 12000-38000 UNITS PO CPEP
12000.0000 [IU] | ORAL_CAPSULE | Freq: Three times a day (TID) | ORAL | Status: DC
  Administered 2023-02-14 – 2023-02-21 (×20): 12000 [IU] via ORAL
  Filled 2023-02-14 (×25): qty 1

## 2023-02-14 MED ORDER — ADULT MULTIVITAMIN W/MINERALS CH
1.0000 | ORAL_TABLET | Freq: Every day | ORAL | Status: DC
  Administered 2023-02-14 – 2023-02-24 (×10): 1 via ORAL
  Filled 2023-02-14 (×15): qty 1

## 2023-02-14 MED ORDER — OLOPATADINE HCL 0.1 % OP SOLN: 1.0000 [drp] | Freq: Every day | OPHTHALMIC | Status: DC | PRN

## 2023-02-14 MED ORDER — THIAMINE HCL 100 MG/ML IJ SOLN: 100.0000 mg | Freq: Once | INTRAMUSCULAR | Status: AC

## 2023-02-14 MED ORDER — POLYETHYLENE GLYCOL 3350 17 G PO PACK
17.0000 g | PACK | Freq: Every day | ORAL | Status: DC | PRN
  Administered 2023-02-15 – 2023-02-24 (×5): 17 g via ORAL
  Filled 2023-02-14 (×5): qty 1

## 2023-02-14 MED ORDER — VITAMIN B-1 100 MG PO TABS
100.0000 mg | ORAL_TABLET | Freq: Every day | ORAL | Status: DC
  Administered 2023-02-15 – 2023-02-25 (×11): 100 mg via ORAL
  Filled 2023-02-14 (×13): qty 1

## 2023-02-14 MED ORDER — LORAZEPAM 1 MG PO TABS
1.0000 mg | ORAL_TABLET | Freq: Four times a day (QID) | ORAL | Status: AC
  Administered 2023-02-14 – 2023-02-15 (×6): 1 mg via ORAL
  Filled 2023-02-14 (×6): qty 1

## 2023-02-14 MED ORDER — LORAZEPAM 1 MG PO TABS
1.0000 mg | ORAL_TABLET | Freq: Two times a day (BID) | ORAL | Status: AC
  Administered 2023-02-17 (×2): 1 mg via ORAL
  Filled 2023-02-14 (×2): qty 1

## 2023-02-14 MED ORDER — TRAZODONE HCL 50 MG PO TABS
50.0000 mg | ORAL_TABLET | Freq: Every evening | ORAL | Status: DC | PRN
  Administered 2023-02-14: 50 mg via ORAL
  Filled 2023-02-14: qty 1

## 2023-02-14 MED ORDER — ONDANSETRON 4 MG PO TBDP: 4.0000 mg | ORAL_TABLET | Freq: Four times a day (QID) | ORAL | Status: AC | PRN

## 2023-02-14 MED ORDER — PANTOPRAZOLE SODIUM 40 MG PO TBEC
40.0000 mg | DELAYED_RELEASE_TABLET | Freq: Two times a day (BID) | ORAL | Status: DC
  Administered 2023-02-14 – 2023-02-25 (×23): 40 mg via ORAL
  Filled 2023-02-14 (×27): qty 1

## 2023-02-14 MED ORDER — HYDROXYZINE HCL 50 MG PO TABS
50.0000 mg | ORAL_TABLET | Freq: Four times a day (QID) | ORAL | Status: AC | PRN
  Administered 2023-02-15: 50 mg via ORAL
  Filled 2023-02-14: qty 1

## 2023-02-14 MED ORDER — FLUTICASONE PROPIONATE HFA 220 MCG/ACT IN AERO: 2.0000 | INHALATION_SPRAY | Freq: Two times a day (BID) | RESPIRATORY_TRACT | Status: DC | PRN

## 2023-02-14 MED ORDER — ESCITALOPRAM OXALATE 10 MG PO TABS
20.0000 mg | ORAL_TABLET | Freq: Every day | ORAL | Status: DC
  Administered 2023-02-15 – 2023-02-25 (×11): 20 mg via ORAL
  Filled 2023-02-14: qty 1
  Filled 2023-02-14 (×3): qty 2
  Filled 2023-02-14: qty 1
  Filled 2023-02-14 (×9): qty 2

## 2023-02-14 MED ORDER — LORAZEPAM 1 MG PO TABS
1.0000 mg | ORAL_TABLET | Freq: Four times a day (QID) | ORAL | Status: AC | PRN
  Administered 2023-02-15: 1 mg via ORAL
  Filled 2023-02-14: qty 1

## 2023-02-14 NOTE — BHH Suicide Risk Assessment (Signed)
[place] INPATIENT:  Family/Significant Other Suicide Prevention Education  Suicide Prevention Education:  Education Completed; Joe Lawrence [phone] (Mother) has been identified by the patient as the family member/significant other with whom the patient will be residing, and identified as the person(s) who will aid the patient in the event of a mental health crisis (suicidal ideations/suicide attempt).  With written consent from the patient, the family member/significant other has been provided the following suicide prevention education, prior to the and/or following the discharge of the patient.  The suicide prevention education provided includes the following: Suicide risk factors Suicide prevention and interventions National Suicide Hotline telephone number [facility] assessment telephone number [facility] Emergency Assistance [place] and/or Residential Mobile Crisis Unit telephone number  Request made of family/significant other to: Remove weapons (e.g., guns, rifles, knives), all items previously/currently identified as safety concern.   Remove drugs/medications (over-the-counter, prescriptions, illicit drugs), all items previously/currently identified as a safety concern.  The family member/significant other verbalizes understanding of the suicide prevention education information provided.  The family member/significant other agrees to remove the items of safety concern listed above.  CSW spoke with Mrs. Hopping who states that "my son is an addict and an alcoholic".  She states "he was clean for 8 years and then asked to go to see [facility] and she started him on Klonopin, Gabapentin, a Beta Blocker, and Lexapro".  She states that "after that he started going to the smoke shop and using Kratom and CBD and he has not been the same since".  Mrs. Hopping states that her son was using Kratom "approximately every 2 hours is what he told me".  She state that she  would like her son to go to an [facility] and get outpatient substance use treatment.  She states that "his sister has stated that he can come live in [place] with her if he chooses".  She states that her son often "has obsessive thoughts about his body and health.  He also washes his hands a lot and has things in the house that he will not touch.  Dr. [facility] said that she thinks he may have OCD".  Mrs. Hopping states that if her son cannot find an [employer] or a treatment center then he can return home. She states "He would not do well in a shelter".  Mrs. Hopping states that there are no firearms or weapons in her home or in her son's possession.  CSW completed SPE with Mrs. Hopping.  Joe Lawrence 02/14/2023, 11:29 AM

## 2023-02-14 NOTE — Group Note (Signed)
Date:  02/14/2023 Time:  11:41 AM  Group Topic/Focus:  Orientation:   The focus of this group is to educate the patient on the purpose and policies of crisis stabilization and provide a format to answer questions about their admission.  The group details unit policies and expectations of patients while admitted.    Participation Level:  Did Not Attend  Participation Quality:  Appropriate  Affect:  Appropriate  Cognitive:  Appropriate  Insight: Appropriate  Engagement in Group:  Engaged  Modes of Intervention:  Discussion  Additional Comments:    Garvin Fila 02/14/2023, 11:41 AM

## 2023-02-14 NOTE — Group Note (Signed)
Recreation Therapy Group Note   Group Topic:Problem Solving  Group Date: 02/14/2023 Start Time: 0930 End Time: 1000 Facilitators: Shigeko Manard-McCall, LRT,CTRS Location: [address] Dayroom   Goal Area(s) Addresses:  Patient will effectively work with peer towards shared goal.  Patient will identify skills used to make activity successful.  Patient will share challenges and verbalize solution-driven approaches used. Patient will identify how skills used during activity can be used to reach post d/c goals.    Group Description:  [employer]. Patients were provided the following materials: 4 drinking straws, 5 rubber bands, 5 paper clips, 2 index cards, 2 drinking cups, and 2 toilet paper rolls. Using the provided materials patients were asked to build a launching mechanism to launch a ping pong ball across the room, approximately 10 feet. Patients were divided into teams of 3-5. Instructions required all materials be incorporated into the device, functionality of items left to the peer group's discretion.   Affect/Mood: N/A   Participation Level: Did not attend    Clinical Observations/Individualized Feedback:     Plan: Continue to engage patient in RT group sessions 2-3x/week.   Christiana Gurevich-McCall, LRT,CTRS 02/14/2023 12:43 PM

## 2023-02-14 NOTE — Plan of Care (Signed)
  Problem: Education: Goal: Knowledge of [place] [facility] Education information/materials will improve 02/14/2023 1446 by Dorris Carnes, RN Outcome: Progressing 02/14/2023 1442 by Dorris Carnes, RN Outcome: Progressing Goal: Emotional status will improve 02/14/2023 1446 by Dorris Carnes, RN Outcome: Progressing 02/14/2023 1442 by Dorris Carnes, RN Outcome: Progressing Goal: Mental status will improve 02/14/2023 1446 by Dorris Carnes, RN Outcome: Progressing 02/14/2023 1442 by Dorris Carnes, RN Outcome: Progressing Goal: Verbalization of understanding the information provided will improve 02/14/2023 1446 by Dorris Carnes, RN Outcome: Progressing 02/14/2023 1442 by Dorris Carnes, RN Outcome: Progressing   Problem: Activity: Goal: Interest or engagement in activities will improve 02/14/2023 1446 by Dorris Carnes, RN Outcome: Progressing 02/14/2023 1442 by Dorris Carnes, RN Outcome: Progressing Goal: Sleeping patterns will improve 02/14/2023 1446 by Dorris Carnes, RN Outcome: Progressing 02/14/2023 1442 by Dorris Carnes, RN Outcome: Progressing

## 2023-02-14 NOTE — BHH Counselor (Signed)
CSW provided the Pt with a list of [employer], substance use treatment facilities and outpatient resources, and a list of additional resources within the [facility] area.

## 2023-02-14 NOTE — BH IP Treatment Plan (Signed)
Interdisciplinary Treatment and Diagnostic Plan Update  02/14/2023 Time of Session: 11:30am Joe Lawrence MRN: [id number]  Principal Diagnosis: Severe major depression, single episode, without psychotic features [facility])  Secondary Diagnoses: Principal Problem:   Severe major depression, single episode, without psychotic features ([place])   Current Medications:  Current Facility-Administered Medications  Medication Dose Route Frequency Provider Last Rate Last Admin   acetaminophen (TYLENOL) tablet 650 mg  650 mg Oral Q6H PRN Ranae Palms, MD   650 mg at 02/14/23 0309   alum & mag hydroxide-simeth (MAALOX/MYLANTA) 200-200-20 MG/5ML suspension 30 mL  30 mL Oral Q4H PRN Ranae Palms, MD       fluticasone (FLOVENT HFA) 220 MCG/ACT inhaler 2 puff  2 puff Inhalation BID PRN Lindell Spar I, NP       gabapentin (NEURONTIN) capsule 600 mg  600 mg Oral QID Evette Georges, NP   600 mg at 02/14/23 1215   hydrOXYzine (ATARAX) tablet 25 mg  25 mg Oral Q6H PRN Ranae Palms, MD   25 mg at 02/14/23 [id number]   lipase/protease/amylase (CREON) capsule 12,000 Units  12,000 Units Oral TID WC Lindell Spar I, NP   12,000 Units at 02/14/23 1214   loperamide (IMODIUM) capsule 2-4 mg  2-4 mg Oral PRN Ranae Palms, MD       LORazepam (ATIVAN) tablet 1 mg  1 mg Oral Q6H PRN Ranae Palms, MD   1 mg at 02/14/23 1037   magnesium hydroxide (MILK OF MAGNESIA) suspension 30 mL  30 mL Oral Daily PRN Ranae Palms, MD       multivitamin with minerals tablet 1 tablet  1 tablet Oral Daily Ranae Palms, MD   1 tablet at 02/14/23 0756   ondansetron (ZOFRAN-ODT) disintegrating tablet 4 mg  4 mg Oral Q6H PRN Ranae Palms, MD       pantoprazole (PROTONIX) EC tablet 40 mg  40 mg Oral BID Lindell Spar I, NP   40 mg at 02/14/23 0945   polyethylene glycol (MIRALAX / GLYCOLAX) packet 17 g  17 g Oral Daily PRN Lindell Spar I, NP       thiamine (Vitamin B-1) tablet 100 mg  100 mg Oral Daily Ranae Palms,  MD   100 mg at 02/14/23 0756   thiamine (VITAMIN B1) injection 100 mg  100 mg Intramuscular Once Ranae Palms, MD       PTA Medications: Facility-Administered Medications Prior to Admission  Medication Dose Route Frequency Provider Last Rate Last Admin   lidocaine (XYLOCAINE) 1 % (with pres) injection 10 mL  10 mL Intradermal Once Denita Lung, MD       Medications Prior to Admission  Medication Sig Dispense Refill Last Dose   clonazePAM (KLONOPIN) 1 MG tablet Take 1 mg by mouth 4 (four) times daily as needed.      escitalopram (LEXAPRO) 20 MG tablet Take 20 mg by mouth daily.      fluticasone (FLOVENT HFA) 220 MCG/ACT inhaler USE 2 PUFFS TWICE DAILY. (Patient taking differently: Inhale 2 puffs into the lungs 2 (two) times daily as needed (shortness of breath).) 36 g 3    gabapentin (NEURONTIN) 600 MG tablet Take 600 mg by mouth 4 (four) times daily.      Olopatadine HCl (PATADAY OP) Place 1 drop into both eyes daily as needed (itching).      pantoprazole (PROTONIX) 40 MG tablet Take 1 tablet (40 mg total) by mouth 2 (two) times daily. 180 tablet 3    polyethylene glycol (MIRALAX /  GLYCOLAX) 17 g packet Take 17 g by mouth daily as needed for mild constipation.      XIIDRA 5 % SOLN Apply 1 drop to eye 2 (two) times daily as needed (dry eyes).       Patient Stressors: Substance abuse    Patient Strengths: Engineer, site for treatment/growth  Supportive family/friends   Treatment Modalities: Medication Management, Group therapy, Case management,  1 to 1 session with clinician, Psychoeducation, Recreational therapy.   Physician Treatment Plan for Primary Diagnosis: Severe major depression, single episode, without psychotic features ([place]) Long Term Goal(s): Improvement in symptoms so as ready for discharge   Short Term Goals: Ability to identify and develop effective coping behaviors will improve Ability to maintain clinical  measurements within normal limits will improve Compliance with prescribed medications will improve Ability to identify triggers associated with substance abuse/mental health issues will improve Ability to identify changes in lifestyle to reduce recurrence of condition will improve Ability to verbalize feelings will improve Ability to disclose and discuss suicidal ideas Ability to demonstrate self-control will improve  Medication Management: Evaluate patient's response, side effects, and tolerance of medication regimen.  Therapeutic Interventions: 1 to 1 sessions, Unit Group sessions and Medication administration.  Evaluation of Outcomes: Progressing  Physician Treatment Plan for Secondary Diagnosis: Principal Problem:   Severe major depression, single episode, without psychotic features ([place])  Long Term Goal(s): Improvement in symptoms so as ready for discharge   Short Term Goals: Ability to identify and develop effective coping behaviors will improve Ability to maintain clinical measurements within normal limits will improve Compliance with prescribed medications will improve Ability to identify triggers associated with substance abuse/mental health issues will improve Ability to identify changes in lifestyle to reduce recurrence of condition will improve Ability to verbalize feelings will improve Ability to disclose and discuss suicidal ideas Ability to demonstrate self-control will improve     Medication Management: Evaluate patient's response, side effects, and tolerance of medication regimen.  Therapeutic Interventions: 1 to 1 sessions, Unit Group sessions and Medication administration.  Evaluation of Outcomes: Progressing   RN Treatment Plan for Primary Diagnosis: Severe major depression, single episode, without psychotic features ([place]) Long Term Goal(s): Knowledge of disease and therapeutic regimen to maintain health will improve  Short Term Goals: Ability to remain free  from injury will improve, Ability to verbalize frustration and anger appropriately will improve, Ability to demonstrate self-control, Ability to participate in decision making will improve, Ability to verbalize feelings will improve, Ability to disclose and discuss suicidal ideas, Ability to identify and develop effective coping behaviors will improve, and Compliance with prescribed medications will improve  Medication Management: RN will administer medications as ordered by provider, will assess and evaluate patient's response and provide education to patient for prescribed medication. RN will report any adverse and/or side effects to prescribing provider.  Therapeutic Interventions: 1 on 1 counseling sessions, Psychoeducation, Medication administration, Evaluate responses to treatment, Monitor vital signs and CBGs as ordered, Perform/monitor CIWA, COWS, AIMS and Fall Risk screenings as ordered, Perform wound care treatments as ordered.  Evaluation of Outcomes: Progressing   LCSW Treatment Plan for Primary Diagnosis: Severe major depression, single episode, without psychotic features ([place]) Long Term Goal(s): Safe transition to appropriate next level of care at discharge, Engage patient in therapeutic group addressing interpersonal concerns.  Short Term Goals: Engage patient in aftercare planning with referrals and resources, Increase social support, Increase ability to appropriately verbalize feelings, Increase emotional regulation, Facilitate  acceptance of mental health diagnosis and concerns, Facilitate patient progression through stages of change regarding substance use diagnoses and concerns, Identify triggers associated with mental health/substance abuse issues, and Increase skills for wellness and recovery  Therapeutic Interventions: Assess for all discharge needs, 1 to 1 time with Social worker, Explore available resources and support systems, Assess for adequacy in community support network,  Educate family and significant other(s) on suicide prevention, Complete Psychosocial Assessment, Interpersonal group therapy.  Evaluation of Outcomes: Progressing   Progress in Treatment: Attending groups: Yes. Participating in groups: Yes. Taking medication as prescribed: No. Toleration medication: Yes. Family/Significant other contact made: Yes, individual(s) contacted:  Marinus Maw [phone] (Mother) Patient understands diagnosis: Yes. Discussing patient identified problems/goals with staff: Yes. Medical problems stabilized or resolved: Yes. Denies suicidal/homicidal ideation: Yes. Issues/concerns per patient self-inventory: No.   New problem(s) identified: No, Describe:  none reported  New Short Term/Long Term Goal(s):   medication stabilization, elimination of SI thoughts, development of comprehensive mental wellness plan.    Patient Goals:  Pt states, "I want to stabilize on medications, get my health on track and get into a halfway house."  Discharge Plan or Barriers:  Patient recently admitted. CSW will continue to follow and assess for appropriate referrals and possible discharge planning.    Reason for Continuation of Hospitalization: Anxiety Depression Medication stabilization Suicidal ideation Withdrawal symptoms  Estimated Length of Stay: 5- 7 days  Last [facility] Suicide Severity Risk Score: [place] Admission (Current) from 02/13/2023 in [place] [facility]  C-SSRS RISK CATEGORY High Risk       Last PHQ 2/9 Scores:    03/22/2022    3:09 PM 05/11/2021    9:27 AM 11/08/2020    2:49 PM  Depression screen PHQ 2/9  Decreased Interest 3 0 2  Down, Depressed, Hopeless 3 0 3  PHQ - 2 Score 6 0 5  Altered sleeping 3 0 3  Tired, decreased energy 3 0 3  Change in appetite 0 0 0  Feeling bad or failure about yourself  3 0 0  Trouble concentrating 3 3 3  $ Moving slowly or fidgety/restless 0 3 3  Suicidal thoughts 0 0 0   PHQ-9 Score 18 6 17  $ Difficult doing work/chores Very difficult Somewhat difficult Extremely dIfficult    Scribe for Treatment Team: Zachery Conch, LCSW 02/14/2023 1:33 PM

## 2023-02-14 NOTE — Progress Notes (Signed)
Patient states he felt like he was sweating. Patient temperature in room was set on 84, patient given ice water in patient's jug, and AC turned on.

## 2023-02-14 NOTE — Progress Notes (Signed)
Patient rounded on at this time, patient asleep.

## 2023-02-14 NOTE — Plan of Care (Signed)
  Problem: Education: Goal: Knowledge of [place] [facility] Education information/materials will improve Outcome: Progressing Goal: Emotional status will improve Outcome: Progressing Goal: Mental status will improve Outcome: Progressing Goal: Verbalization of understanding the information provided will improve Outcome: Progressing   Problem: Activity: Goal: Interest or engagement in activities will improve Outcome: Progressing Goal: Sleeping patterns will improve Outcome: Progressing   

## 2023-02-14 NOTE — Progress Notes (Signed)
   02/14/23 0535  15 Minute Checks  Location Bedroom  Visual Appearance Calm  Behavior Sleeping  Sleep (Behavioral Health Patients Only)  Calculate sleep? (Click Yes once per 24 hr at 0600 safety check) Yes  Documented sleep last 24 hours 6.75

## 2023-02-14 NOTE — BHH Suicide Risk Assessment (Signed)
Suicide Risk Assessment  Admission Assessment    [facility] Admission Suicide Risk Assessment   Nursing information obtained from:  Patient  Demographic factors:  Male, Caucasian  Current Mental Status:  Intention to act on suicide plan  Loss Factors:  Decline in physical health  Historical Factors:  Impulsivity  Risk Reduction Factors:  Living with another person, especially a relative  Total Time spent with patient: 1 hour  Principal Problem: Severe major depression, single episode, without psychotic features ([place])  Diagnosis:  Principal Problem:   Severe major depression, single episode, without psychotic features ([place])  Subjective Data: See H&P  Continued Clinical Symptoms:  Alcohol Use Disorder Identification Test Final Score (AUDIT): 1 The "Alcohol Use Disorders Identification Test", Guidelines for Use in Primary Care, Second Edition.  World Pharmacologist [facility]). Score between 0-7:  no or low risk or alcohol related problems. Score between 8-15:  moderate risk of alcohol related problems. Score between 16-19:  high risk of alcohol related problems. Score 20 or above:  warrants further diagnostic evaluation for alcohol dependence and treatment.  CLINICAL FACTORS:   Severe Anxiety and/or Agitation Panic Attacks Depression:   Comorbid alcohol abuse/dependence Hopelessness Impulsivity Insomnia Severe Alcohol/Substance Abuse/Dependencies More than one psychiatric diagnosis Unstable or Poor Therapeutic Relationship Previous Psychiatric Diagnoses and Treatments  Musculoskeletal: Strength & Muscle Tone: within normal limits Gait & Station: normal Patient leans: N/A  Psychiatric Specialty Exam:  Presentation  General Appearance:  Casual; Fairly Groomed  Eye Contact: Fair  Speech: Clear and Coherent; Other (comment) (anxiously talkative)  Speech Volume: Normal  Handedness: Right   Mood and Affect  Mood: Anxious; Depressed; Dysphoric;  Hopeless  Affect: Congruent; Depressed; Tearful  Thought Process  Thought Processes: Coherent; Goal Directed; Linear  Descriptions of Associations:Circumstantial  Orientation:Full (Time, Place and Person)  Thought Content:Rumination; Scattered; Perseveration  History of Schizophrenia/Schizoaffective disorder:No data recorded Duration of Psychotic Symptoms:No data recorded Hallucinations:Hallucinations: None  Ideas of Reference:None  Suicidal Thoughts:Suicidal Thoughts: No  Homicidal Thoughts:Homicidal Thoughts: No  Sensorium  Memory: Immediate Good; Recent Good; Remote Good  Judgment: Fair  Insight: Fair  Community education officer  Concentration: Poor  Attention Span: Poor  Recall: [place] of Knowledge: Fair  Language: Good  Psychomotor Activity  Psychomotor Activity: Psychomotor Activity: Increased; Restlessness  Assets  Assets: Armed forces logistics/support/administrative officer; Desire for Improvement; Financial Resources/Insurance; Housing; Social Support; Resilience  Sleep  Sleep: Sleep: Poor Number of Hours of Sleep: 4.5  Physical Exam: See H&P. Blood pressure 124/88, pulse 89, temperature 97.9 F (36.6 C), temperature source Oral, resp. rate 20, height 5' 8"$  (1.727 m), weight 65.8 kg, SpO2 100 %. Body mass index is 22.05 kg/m.  COGNITIVE FEATURES THAT CONTRIBUTE TO RISK:  Closed-mindedness, Loss of executive function, Polarized thinking, and Thought constriction (tunnel vision)    SUICIDE RISK:   Severe:  Frequent, intense, and enduring suicidal ideation, specific plan, no subjective intent, but some objective markers of intent (i.e., choice of lethal method), the method is accessible, some limited preparatory behavior, evidence of impaired self-control, severe dysphoria/symptomatology, multiple risk factors present, and few if any protective factors, particularly a lack of social support.  PLAN OF CARE: See H&P  I certify that inpatient services furnished can  reasonably be expected to improve the patient's condition.   Lindell Spar, NP, pmhnp, fnp-bc 02/14/2023, 4:08 PM

## 2023-02-14 NOTE — Progress Notes (Addendum)
Patient stated that he feels embarrassed. He reports, "I was sober for 8 years just for all of that to go down the drain, and to be in the same place I was at 8 years ago." Patient given support and encouragement. Patient contracts for safety.

## 2023-02-14 NOTE — H&P (Signed)
Psychiatric Admission Assessment Adult  Patient Identification: Joe Lawrence  MRN:  [id number]  Date of Evaluation:  02/14/2023  Chief Complaint: Intentional suicide attempt by hanging triggered by worsening substance use/addictions.    Principal Diagnosis: Severe major depression, single episode, without psychotic features ([place])  Diagnosis:  Principal Problem:   Severe major depression, single episode, without psychotic features ([place]) Active Problems:   Opioid use disorder, severe, dependence (HCC)   Severe benzodiazepine use disorder (HCC)   Generalized anxiety disorder  History of Present Illness: This is one of several psychiatric admissions/evaluations in this [facility] for this 52 year old Caucasian male with hx of major depressive disorder, chronic, generalized anxiety disorder, alcohol use disorder, benzodiazepine use disorder, opioid use disorder & cannabis use disorder. Admitted to the [facility] from the [facility] with complain of suicide attempt by hanging triggered by sever substance withdrawal symptoms (benzodiazepine & Kratom). Patient is currently receiving mental health care on an outpatient basis with Dr. Earlie Lou. Chart review indicated that patient reported at the ED that he was feeling overwhelmed from substance withdrawal, got frustrated & decided to hang himself 2 nights ago. After medical evaluation/stabilization/clearance, [name] was transferred to the [facility] for further psychiatric evaluation/treatments. During this evaluation, [name] reports while anxious/restless,   "My mother called 37 for me & the cops came & dropped me off at the [facility] ED. That was 2 days ago. I went in for clonazepam/Kratom withdrawal symptoms. I needed detox. I was a heavy alcohol drinker, opioid abuser, benzodiazepine abuser & cannabis dependent. But, I was sober for 8.5 years, doing well. I was active at the [employer] meetings until something bad happened there during our weekly  meeting two years ago. It was something about trans-genderism & gay life style. I'm not gay, I used to think I was, but now I know I'm not. I have an uncle who is gay, l was very close to him. I used to hang around him a lot because that was how I get my drugs & all the alcohol I need. I do not discriminate against gay individuals. When one of the AA group members made a bad comment about gay life style, I got mad & left the meeting. Once I left the meeting, I did not have that support system any more, I relapsed. And for the last 2 years, I have been abusing benzodiazepine, alcohol, weed & Kratom on daily basis. I never knew that Kratom is like opioid. I never knew that you can get addicted to it too. Now, here I'm. I need help. I need to get on detox protocols. I need ativan. I'm withdrawing badly. I feel very restless & anxious. I'm sweating real bad. I'm having panic attacks. Oh my God, help me".   Objective: [name] presents high anxious, restless with poor concentration/focus. He is crying profusely. His legs, arms & whole body are rigorously shaking. He is inattentive to the assessment questions. He is solely focused on getting ativan. His liver enzymes are highly elevated. This will be repeated in two days. He has been resumed on his home medications & CIWA has been initiated as well. He currently denies any SIHI, AVH, delusional thoughts or paranoia. He does not appear to be responding to any internal stimuli. May need a long term substance abuse treatment program after discharge.  Associated Signs/Symptoms:  Depression Symptoms:  depressed mood, insomnia, psychomotor agitation, feelings of worthlessness/guilt, difficulty concentrating, hopelessness, suicidal attempt, anxiety, panic attacks,  (Hypo) Manic Symptoms:  Impulsivity,  Anxiety Symptoms:  Excessive Worry, Panic Symptoms,  Psychotic Symptoms:   Denies any AVH, delusional thoughts or paranoia. He does not appear to be responding  to any internal stimuli.  PTSD Symptoms: NA  Total Time spent with patient: 1 hour  Past Psychiatric History: Multiple psychiatric admissions here at [facility] & other psychiatric hospitals. Currently seeing Dr. Toy Care here in [place], [place] on an outpatient basis. Hx polysubstance use disorders that include; alcohol, opioid, benzodiazepine & cannabis. Major depressive disorder, generalized anxiety disorder. Suicidal ideations/attempts.  Is the patient at risk to self? No.  Has the patient been a risk to self in the past 6 months? Yes.    Has the patient been a risk to self within the distant past? Yes.    Is the patient a risk to others? No.  Has the patient been a risk to others in the past 6 months? No.  Has the patient been a risk to others within the distant past? No.   [place] Scale:  [facility] Admission (Current) from 02/13/2023 in [place] [facility]  C-SSRS RISK CATEGORY High Risk      Prior Inpatient Therapy: Yes.   If yes, describe: [facility] x multiple times.   Prior Outpatient Therapy: Yes.   If yes, describe: Dr. Toy Care   Alcohol Screening: 1. How often do you have a drink containing alcohol?: Monthly or less 2. How many drinks containing alcohol do you have on a typical day when you are drinking?: 1 or 2 3. How often do you have six or more drinks on one occasion?: Never AUDIT-C Score: 1 4. How often during the last year have you found that you were not able to stop drinking once you had started?: Never 5. How often during the last year have you failed to do what was normally expected from you because of drinking?: Never 6. How often during the last year have you needed a first drink in the morning to get yourself going after a heavy drinking session?: Never 7. How often during the last year have you had a feeling of guilt of remorse after drinking?: Never 8. How often during the last year have you been unable to remember what happened the night before  because you had been drinking?: Never 9. Have you or someone else been injured as a result of your drinking?: No 10. Has a relative or friend or a doctor or another health worker been concerned about your drinking or suggested you cut down?: No Alcohol Use Disorder Identification Test Final Score (AUDIT): 1 Alcohol Brief Interventions/Follow-up: Alcohol education/Brief advice  Substance Abuse History in the last 12 months:  Yes.    Consequences of Substance Abuse: Discussed with patient during this admission evaluation. Medical Consequences:  Liver damage, Possible death by overdose Legal Consequences:  Arrests, jail time, Loss of driving privilege. Family Consequences:  Family discord, divorce and or separation.  Previous Psychotropic Medications: Yes   Psychological Evaluations: No   Past Medical History:  Past Medical History:  Diagnosis Date   Anxiety    Asthma    Bipolar disorder ([place])    Bronchitis    Chronic back pain    Chronic pancreatitis (HCC)    Depression    GERD (gastroesophageal reflux disease)    Hx of suicide attempt    x 3   Pancreatitis    PPD positive    Tobacco abuse     Past Surgical History:  Procedure Laterality Date  BACK SURGERY     internal nerve stimulator placement and removal   Epidural Steroid Injection  02/23/2011   Right Arm Surgery     Shave Right 01/08/2021   seborrheic keratosis, inflamed   Family History:  Family History  Problem Relation Age of Onset   Alcohol abuse Brother    Family Psychiatric  History: Alcohol use disorder: Brother.  Tobacco Screening:  Social History   Tobacco Use  Smoking Status Former   Packs/day: 1.00   Years: 2.00   Total pack years: 2.00   Types: Cigarettes  Smokeless Tobacco Never    BH Tobacco Counseling     Are you interested in Tobacco Cessation Medications?  No value filed. Counseled patient on smoking cessation:  No value filed. Reason Tobacco Screening Not Completed: No value  filed.       Social History: Patient is single, has no children, unemployed, collects disability checks, Social History   Substance and Sexual Activity  Alcohol Use Yes   Comment: equivalent to x24 beers daily     Social History   Substance and Sexual Activity  Drug Use Yes   Comment: heroin, crack    Additional Social History:  Allergies:   Allergies  Allergen Reactions   Celexa [Citalopram]     Muscle tighness   Chlorpromazine Hcl Other (See Comments)    Pt does not remember this allergy.  May be 20 years ago.   Iodine     Skin has burning sensation.   Sulfonamide Derivatives     Patient does not know of this allergy or the reaction.   Voltaren [Diclofenac Sodium]     voltaren has caused his arm to swell in the past, does not remember when this happened.   Lab Results:  Results for orders placed or performed during the hospital encounter of 02/13/23 (from the past 48 hour(s))  CBC     Status: Abnormal   Collection Time: 02/14/23  6:35 AM  Result Value Ref Range   WBC 4.0 4.0 - 10.5 K/uL   RBC 4.39 4.22 - 5.81 MIL/uL   Hemoglobin 12.8 (L) 13.0 - 17.0 g/dL   HCT 39.3 39.0 - 52.0 %   MCV 89.5 80.0 - 100.0 fL   MCH 29.2 26.0 - 34.0 pg   MCHC 32.6 30.0 - 36.0 g/dL   RDW 13.3 11.5 - 15.5 %   Platelets 284 150 - 400 K/uL   nRBC 0.0 0.0 - 0.2 %    Comment: Performed at [facility], [place] [address] [place], [place] [address]  Hemoglobin A1c     Status: None   Collection Time: 02/14/23  6:35 AM  Result Value Ref Range   Hgb A1c MFr Bld 5.1 4.8 - 5.6 %    Comment: (NOTE) Pre diabetes:          5.7%-6.4%  Diabetes:              >6.4%  Glycemic control for   <7.0% adults with diabetes    Mean Plasma Glucose 99.67 mg/dL    Comment: Performed at [facility] [address] [place], [place] [address]  Lipid panel     Status: Abnormal   Collection Time: 02/14/23  6:35 AM  Result Value Ref Range   Cholesterol 256 (H) 0 - 200 mg/dL    Triglycerides 110 <150 mg/dL   HDL 75 >40 mg/dL   Total CHOL/HDL Ratio 3.4 RATIO   VLDL 22 0 - 40 mg/dL  LDL Cholesterol 159 (H) 0 - 99 mg/dL    Comment:        Total Cholesterol/HDL:CHD Risk Coronary Heart Disease Risk Table                     Men   Women  1/2 Average Risk   3.4   3.3  Average Risk       5.0   4.4  2 X Average Risk   9.6   7.1  3 X Average Risk  23.4   11.0        Use the calculated Patient Ratio above and the CHD Risk Table to determine the patient's CHD Risk.        ATP III CLASSIFICATION (LDL):  <100     mg/dL   Optimal  100-129  mg/dL   Near or Above                    Optimal  130-159  mg/dL   Borderline  160-189  mg/dL   High  >190     mg/dL   Very High Performed at [facility] [address] [place], [place] [address]   TSH     Status: None   Collection Time: 02/14/23  6:35 AM  Result Value Ref Range   TSH 1.867 0.350 - 4.500 uIU/mL    Comment: Performed by a 3rd Generation assay with a functional sensitivity of <=0.01 uIU/mL. Performed at [facility], [place] [address], [place],  [address]    Blood Alcohol level:  Lab Results  Component Value Date   ETH <10 02/13/2023   ETH 187 (H) [id number]   Metabolic Disorder Labs:  Lab Results  Component Value Date   HGBA1C 5.1 02/14/2023   MPG 99.67 02/14/2023   No results found for: "PROLACTIN" Lab Results  Component Value Date   CHOL 256 (H) 02/14/2023   TRIG 110 02/14/2023   HDL 75 02/14/2023   CHOLHDL 3.4 02/14/2023   VLDL 22 02/14/2023   LDLCALC 159 (H) 02/14/2023   LDLCALC 83 12/05/2021   Current Medications: Current Facility-Administered Medications  Medication Dose Route Frequency Provider Last Rate Last Admin   acetaminophen (TYLENOL) tablet 650 mg  650 mg Oral Q6H PRN Ranae Palms, MD   650 mg at 02/14/23 0309   alum & mag hydroxide-simeth (MAALOX/MYLANTA) 200-200-20 MG/5ML suspension 30 mL  30 mL Oral Q4H PRN Ranae Palms,  MD       escitalopram (LEXAPRO) tablet 20 mg  20 mg Oral Daily Mailey Landstrom I, NP       fluticasone (FLOVENT HFA) 220 MCG/ACT inhaler 2 puff  2 puff Inhalation BID PRN Lindell Spar I, NP       gabapentin (NEURONTIN) capsule 600 mg  600 mg Oral QID Evette Georges, NP   600 mg at 02/14/23 1215   hydrOXYzine (ATARAX) tablet 50 mg  50 mg Oral Q6H PRN Lindell Spar I, NP       lipase/protease/amylase (CREON) capsule 12,000 Units  12,000 Units Oral TID WC Dellar Traber I, NP   12,000 Units at 02/14/23 1214   loperamide (IMODIUM) capsule 2-4 mg  2-4 mg Oral PRN Lindell Spar I, NP       LORazepam (ATIVAN) tablet 1 mg  1 mg Oral Q6H PRN Quintasia Theroux I, NP       LORazepam (ATIVAN) tablet 1 mg  1 mg Oral QID Encarnacion Slates, NP  Followed by   Derrill Memo ON 02/16/2023] LORazepam (ATIVAN) tablet 1 mg  1 mg Oral TID Lindell Spar I, NP       Followed by   Derrill Memo ON 02/17/2023] LORazepam (ATIVAN) tablet 1 mg  1 mg Oral BID Lindell Spar I, NP       Followed by   Derrill Memo ON 02/18/2023] LORazepam (ATIVAN) tablet 1 mg  1 mg Oral Daily Indio Santilli I, NP       magnesium hydroxide (MILK OF MAGNESIA) suspension 30 mL  30 mL Oral Daily PRN Ranae Palms, MD       multivitamin with minerals tablet 1 tablet  1 tablet Oral Daily Kainen Struckman I, NP       ondansetron (ZOFRAN-ODT) disintegrating tablet 4 mg  4 mg Oral Q6H PRN Lindell Spar I, NP       pantoprazole (PROTONIX) EC tablet 40 mg  40 mg Oral BID Lindell Spar I, NP   40 mg at 02/14/23 0945   polyethylene glycol (MIRALAX / GLYCOLAX) packet 17 g  17 g Oral Daily PRN Encarnacion Slates, NP       [START ON 02/15/2023] thiamine (Vitamin B-1) tablet 100 mg  100 mg Oral Daily Martita Brumm I, NP       thiamine (VITAMIN B1) injection 100 mg  100 mg Intramuscular Once Lindell Spar I, NP       traZODone (DESYREL) tablet 50 mg  50 mg Oral QHS PRN Lindell Spar I, NP       PTA Medications: Facility-Administered Medications Prior to Admission  Medication Dose Route Frequency Provider  Last Rate Last Admin   lidocaine (XYLOCAINE) 1 % (with pres) injection 10 mL  10 mL Intradermal Once Denita Lung, MD       Medications Prior to Admission  Medication Sig Dispense Refill Last Dose   clonazePAM (KLONOPIN) 1 MG tablet Take 1 mg by mouth 4 (four) times daily as needed.      escitalopram (LEXAPRO) 20 MG tablet Take 20 mg by mouth daily.      fluticasone (FLOVENT HFA) 220 MCG/ACT inhaler USE 2 PUFFS TWICE DAILY. (Patient taking differently: Inhale 2 puffs into the lungs 2 (two) times daily as needed (shortness of breath).) 36 g 3    gabapentin (NEURONTIN) 600 MG tablet Take 600 mg by mouth 4 (four) times daily.      Olopatadine HCl (PATADAY OP) Place 1 drop into both eyes daily as needed (itching).      pantoprazole (PROTONIX) 40 MG tablet Take 1 tablet (40 mg total) by mouth 2 (two) times daily. 180 tablet 3    polyethylene glycol (MIRALAX / GLYCOLAX) 17 g packet Take 17 g by mouth daily as needed for mild constipation.      XIIDRA 5 % SOLN Apply 1 drop to eye 2 (two) times daily as needed (dry eyes).      Musculoskeletal: Strength & Muscle Tone: within normal limits Gait & Station: normal Patient leans: N/A  Psychiatric Specialty Exam:  Presentation  General Appearance:  Casual; Fairly Groomed  Eye Contact: Fair  Speech: Clear and Coherent; Other (comment) (anxiously talkative)  Speech Volume: Normal  Handedness: Right   Mood and Affect  Mood: Anxious; Depressed; Dysphoric; Hopeless  Affect: Congruent; Depressed; Tearful   Thought Process  Thought Processes: Coherent; Goal Directed; Linear  Duration of Psychotic Symptoms: Greater than 2 weeks.  Past Diagnosis of Schizophrenia or Psychoactive disorder: NA  Descriptions of Associations:Circumstantial  Orientation:Full (Time, Place and Person)  Thought Content:Rumination; Scattered; Perseveration  Hallucinations:Hallucinations: None  Ideas of Reference:None  Suicidal Thoughts:Suicidal  Thoughts: No  Homicidal Thoughts:Homicidal Thoughts: No  Sensorium  Memory: Immediate Good; Recent Good; Remote Good  Judgment: Fair  Insight: Fair  Community education officer  Concentration: Poor  Attention Span: Poor  Recall: [place] of Knowledge: Fair  Language: Good  Psychomotor Activity  Psychomotor Activity: Psychomotor Activity: Increased; Restlessness  Assets  Assets: Armed forces logistics/support/administrative officer; Desire for Improvement; Financial Resources/Insurance; Housing; Social Support; Resilience  Sleep  Sleep: Sleep: Poor Number of Hours of Sleep: 4.5  Physical Exam: Physical Exam Vitals and nursing note reviewed.  HENT:     Head: Normocephalic.     Nose: Nose normal.     Mouth/Throat:     Pharynx: Oropharynx is clear.  Eyes:     Pupils: Pupils are equal, round, and reactive to light.  Cardiovascular:     Rate and Rhythm: Normal rate.     Pulses: Normal pulses.  Pulmonary:     Effort: Pulmonary effort is normal.  Genitourinary:    Comments: Deferred Musculoskeletal:        General: Normal range of motion.     Cervical back: Normal range of motion.  Skin:    General: Skin is warm and dry.  Neurological:     General: No focal deficit present.     Mental Status: He is alert and oriented to person, place, and time.    Review of Systems  Constitutional:  Negative for chills, diaphoresis and fever.  HENT:  Negative for congestion and sore throat.   Eyes:  Negative for blurred vision.  Respiratory:  Negative for cough, shortness of breath and wheezing.   Cardiovascular:  Negative for chest pain and palpitations.  Gastrointestinal:  Negative for abdominal pain, constipation, diarrhea, heartburn, nausea and vomiting.  Genitourinary:  Negative for dysuria.  Musculoskeletal:  Negative for joint pain and myalgias.  Skin:  Negative for itching and rash.  Neurological:  Negative for dizziness, tingling, tremors, sensory change, speech change, focal weakness,  seizures, loss of consciousness, weakness and headaches.  Endo/Heme/Allergies:        See allergy lists.  Psychiatric/Behavioral:  Positive for depression and substance abuse (Hx polysubstance use disorder (Benzodiazepine, alcohol, opioid, weed)). Negative for hallucinations, memory loss and suicidal ideas (hx of/attempts.). The patient is nervous/anxious (Increased restlessness/panic symptoms.) and has insomnia.    Blood pressure 95/79, pulse 93, temperature 97.9 F (36.6 C), temperature source Oral, resp. rate 20, height 5' 8"$  (1.727 m), weight 65.8 kg, SpO2 97 %. Body mass index is 22.05 kg/m.  Treatment Plan Summary: Daily contact with patient to assess and evaluate symptoms and progress in treatment and Medication management.   Principal/active diagnoses.  Severe major depression, single episode, without psychotic features. Severe major depression, single episode, without psychotic features. Opioid use disorder, severe, dependence. Severe benzodiazepine use disorder. Generalized anxiety disorder.  Alcohol use disorder.  Plan: Discussed this case with the attending psychiatrist. See treatment plan/recommendations below.  -Continue Lexapro 20 mg po daily for depression. -Continue the CIWA detox protocols for benzodiazepine withdrawal management.  -Continue gabapentin 600 mg po qid for substance withdrawal syndrome.  -Continue Vistaril 50 mg po qid prn for anxiety.  -Continue Trazodone 50 mg po q hs prn for insomnia.  Other medical ailments.  -Continue Creon 12.00 mg po tid for pancreatic enzyme insufficiency.  -Continue Flovent inhaler 2 puffs bid prn for SOB. -Continue Protonix 40 mg po Q am for acid reflux.  Other PRNS -  Continue Tylenol 650 mg every 6 hours PRN for mild pain -Continue Maalox 30 ml Q 4 hrs PRN for indigestion -Continue MOM 30 ml po Q 6 hrs for constipation  Safety and Monitoring: Voluntary admission to inpatient psychiatric unit for safety, stabilization  and treatment Daily contact with patient to assess and evaluate symptoms and progress in treatment Patient's case to be discussed in multi-disciplinary team meeting Observation Level : q15 minute checks Vital signs: q12 hours Precautions: Safety  Discharge Planning: Social work and case management to assist with discharge planning and identification of hospital follow-up needs prior to discharge Estimated LOS: 5-7 days Discharge Concerns: Need to establish a safety plan; Medication compliance and effectiveness Discharge Goals: Return home with outpatient referrals for mental health follow-up including medication management/psychotherapy  Observation Level/Precautions:  15 minute checks  Laboratory:   Per ED, liver enzymes elevated, repeat in 2 days.  Psychotherapy: Enrolled in the group sessions.  Medications:  See MAR.  Consultations: As needed.  Discharge Concerns: Safety, mood stability.  Estimated LOS: 3-5 days  Other: NA   Physician Treatment Plan for Primary Diagnosis: Severe major depression, single episode, without psychotic features ([place])  Long Term Goal(s): Improvement in symptoms so as ready for discharge  Short Term Goals: Ability to identify changes in lifestyle to reduce recurrence of condition will improve, Ability to verbalize feelings will improve, Ability to disclose and discuss suicidal ideas, and Ability to demonstrate self-control will improve  Physician Treatment Plan for Secondary Diagnosis: Principal Problem:   Severe major depression, single episode, without psychotic features ([place]) Active Problems:   Opioid use disorder, severe, dependence ([place])   Severe benzodiazepine use disorder ([place])   Generalized anxiety disorder  Long Term Goal(s): Improvement in symptoms so as ready for discharge  Short Term Goals: Ability to identify and develop effective coping behaviors will improve, Ability to maintain clinical measurements within normal limits will improve,  Compliance with prescribed medications will improve, and Ability to identify triggers associated with substance abuse/mental health issues will improve  I certify that inpatient services furnished can reasonably be expected to improve the patient's condition.    Lindell Spar, NP, pmhnp, fnp-bc. 2/16/20244:23 PM

## 2023-02-14 NOTE — BHH Counselor (Signed)
Adult Comprehensive Assessment  Patient ID: Joe Lawrence, male   DOB: 1971-08-13, 52 y.o.   MRN: [id number]  Information Source: Information source: Patient  Current Stressors:  Patient states their primary concerns and needs for treatment are:: "Depression, anxiety, detox, and suicide attempt" Patient states their goals for this hospitilization and ongoing recovery are:: "To detox and develop a plan for outpatient follow-up" Educational / Learning stressors: Pt reports having a 12th grade education Employment / Job issues: Pt reports receiving SSDI for 15 years due to his mental health Family Relationships: Pt reports having no contact with his brother or sister. Financial / Lack of resources (include bankruptcy): Pt reports receiving SSDI, Medicaid, and Medicare. Housing / Lack of housing: Pt reports living with his mother Physical health (include injuries & life threatening diseases): Pt reports no stressors Social relationships: Pt reports having few social supports. Substance abuse: Pt reports drinking alcohol, using Klonopin, Gabapentin, and CBD Bereavement / Loss: Pt reports his father passed away 30 years ago  Living/Environment/Situation:  Living Arrangements: Parent Living conditions (as described by patient or guardian): House/[place] Who else lives in the home?: Mother How long has patient lived in current situation?: 8 years What is atmosphere in current home: Comfortable, Supportive  Family History:  Marital status: Single Are you sexually active?: No What is your sexual orientation?: Heterosexual Has your sexual activity been affected by drugs, alcohol, medication, or emotional stress?: No Does patient have children?: No  Childhood History:  By whom was/is the patient raised?: Mother Additional childhood history information: Pt reports having weekend visits with his father as a teenage.  He states that his father passed away 30 years ago. Description  of patient's relationship with caregiver when they were a child: "I had a very good relationship with my mother" Patient's description of current relationship with people who raised him/her: "It is even better now and we are very close" How were you disciplined when you got in trouble as a child/adolescent?: Spankings and Groundings Does patient have siblings?: Yes Number of Siblings: 2 Description of patient's current relationship with siblings: "I have a brother in [place] and a Sister in [place] but we don't get along well" Did patient suffer any verbal/emotional/physical/sexual abuse as a child?: No Did patient suffer from severe childhood neglect?: No Has patient ever been sexually abused/assaulted/raped as an adolescent or adult?: No Was the patient ever a victim of a crime or a disaster?: No Witnessed domestic violence?: No Has patient been affected by domestic violence as an adult?: No  Education:  Highest grade of school patient has completed: 12th grade Currently a student?: No Learning disability?: Yes What learning problems does patient have?: ADHD  Employment/Work Situation:   Employment Situation: On disability Why is Patient on Disability: Mental Health How Long has Patient Been on Disability: 15 years Patient's Job has Been Impacted by Current Illness: No What is the Longest 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 If attempted suicide, did drugs/alcohol play a role in this?: No Alcohol/Substance Abuse Treatment Hx: Past Tx, Inpatient, Attends AA/NA, Past detox If yes, describe  treatment: Pt  reports attending AA and being inpatient 8 years ago Has alcohol/substance abuse ever caused legal problems?: No  Social Support System:   Heritage manager System: Poor Describe Community Support System: "Mother" Type of faith/religion: None How does patient's faith help to cope with current illness?: N/A  Leisure/Recreation:   Do You Have Hobbies?: Yes Leisure and Hobbies: "Disk Golf"  Strengths/Needs:   What is the patient's perception of their strengths?: "Ii am easy to get along with" Patient states they can use these personal strengths during their treatment to contribute to their recovery: "By talking to others like I did in [place]" Patient states these barriers may affect/interfere with their treatment: None Patient states these barriers may affect their return to the community: None Other important information patient would like considered in planning for their treatment: None  Discharge Plan:   Currently receiving community mental health services: No Patient states concerns and preferences for aftercare planning are: Pt is interseted in therapy and medication management services in the [place] area Patient states they will know when they are safe and ready for discharge when: "I don't know, maybe a 10 day detox" Does patient have access to transportation?: Yes (Mother) Does patient have financial barriers related to discharge medications?: No Plan for living situation after discharge: Pt would like to explore [employer] Will patient be returning to same living situation after discharge?: No  Summary/Recommendations:   Summary and Recommendations (to be completed by the evaluator): Joe Lawrence is a 52 year old, male, who was admitted to the hospital due to worsening depression, anxiety, substance use, and a suicide attempt by hanging.  The Pt reports that he would like to detox off of his Klonopin and Gabapentin.  The Pt reports that he  is currentl living with his mother.  He states that he is very close with his mother.  He reports having a brother and a sister that he does not have contact with and who live in other states.  He states that his father passed away 30 years ago. The Pt denies any childhood abuse or neglect.  He states that he has a 12th grade education and has received SSDI for his mental health for 15 years.  He states that he also receives Medicaid and Medicare benefits.  The Pt reports drinking alcohol, using Klonopin, Gabapentin, and CBD. He states that he was sober for 8 years prior to this recent relapse.  He states that his last treatment for substance use was 8 years ago inpatient and attending [place] meetings.  He denies any current substance use treatment. While in the hospital the Pt can benefit from crisis stabilization, medication evaluation, group therapy, psychoeducation, case management, and discharge planning. Upon discharge the Pt would like to go to an [facility] or return home with his mother. It is recommended that the Pt follow-up with a local outpatient provider for therapy, medication management, and CDIOP services. It is also recommended that the Pt continue to take all medications as prescribed until directed to do otherwise by his providers.  At discharge it is recommended that the patient adhere to the established aftercare plan.  Darleen Crocker. 02/14/2023

## 2023-02-14 NOTE — Progress Notes (Signed)
   02/14/23 0800  Psych Admission Type (Psych Patients Only)  Admission Status Voluntary  Psychosocial Assessment  Patient Complaints Anxiety;Shakiness  Eye Contact Fair  Facial Expression Anxious  Affect Anxious  Speech Incoherent  Interaction Assertive  Motor Activity Tremors  Appearance/Hygiene Unremarkable  Behavior Characteristics Cooperative;Appropriate to situation  Mood Anxious;Pleasant;Depressed  Thought Process  Coherency WDL  Content Blaming self  Delusions None reported or observed  Perception WDL  Hallucination None reported or observed  Judgment Poor  Confusion None  Danger to Self  Current suicidal ideation? Denies  Agreement Not to Harm Self Yes  Description of Agreement Verbal  Danger to Others  Danger to Others None reported or observed

## 2023-02-15 DIAGNOSIS — F322 Major depressive disorder, single episode, severe without psychotic features: Secondary | ICD-10-CM | POA: Diagnosis not present

## 2023-02-15 MED ORDER — SENNOSIDES-DOCUSATE SODIUM 8.6-50 MG PO TABS
1.0000 | ORAL_TABLET | Freq: Every day | ORAL | Status: DC
  Administered 2023-02-15 – 2023-02-22 (×4): 1 via ORAL
  Filled 2023-02-15 (×12): qty 1

## 2023-02-15 MED ORDER — QUETIAPINE FUMARATE 50 MG PO TABS
50.0000 mg | ORAL_TABLET | Freq: Every day | ORAL | Status: DC
  Administered 2023-02-15 – 2023-02-16 (×2): 50 mg via ORAL
  Filled 2023-02-15 (×5): qty 1

## 2023-02-15 NOTE — Group Note (Signed)
LCSW Group Therapy Note  Group Date: 02/15/2023 Start Time: [id number] End Time: 1115   Type of Therapy and Topic:  Group Therapy: Anger Cues and Responses  Participation Level:  Active   Description of Group:   In this group, patients learned how to recognize the physical, cognitive, emotional, and behavioral responses they have to anger-provoking situations.  They identified a recent time they became angry and how they reacted.  They analyzed how their reaction was possibly beneficial and how it was possibly unhelpful.  The group discussed a variety of healthier coping skills that could help with such a situation in the future.  Focus was placed on how helpful it is to recognize the underlying emotions to our anger, because working on those can lead to a more permanent solution as well as our ability to focus on the important rather than the urgent.  Therapeutic Goals: Patients will remember their last incident of anger and how they felt emotionally and physically, what their thoughts were at the time, and how they behaved. Patients will identify how their behavior at that time worked for them, as well as how it worked against them. Patients will explore possible new behaviors to use in future anger situations. Patients will learn that anger itself is normal and cannot be eliminated, and that healthier reactions can assist with resolving conflict rather than worsening situations.  Summary of Patient Progress:  Taavi was active during the group. He shared a recent occurrence wherein feeling resentments led to anger, which usually leads to relapse. He demonstrated fair insight into the subject matter, was respectful of peers, and participated throughout the entire session.  Therapeutic Modalities:   Cognitive Behavioral Therapy    Marquette Old 02/15/2023  4:07 PM

## 2023-02-15 NOTE — [facility] Group Notes (Signed)
[place] Group Notes:  (Nursing/MHT/Case Management/Adjunct)  Date:  02/15/2023  Time:  9:47 PM  Type of Therapy:  Group Therapy  Participation Level:  Active  Participation Quality:  Appropriate  Affect:  Appropriate  Cognitive:  Appropriate  Insight:  Appropriate  Engagement in Group:  Engaged  Modes of Intervention:  Education  Summary of Progress/Problems: Pt attended group, day was 4/10. Goal was to attend more groups. Goal met,  Orvan Falconer 02/15/2023, 9:47 PM

## 2023-02-15 NOTE — [facility] Group Notes (Signed)
Goals Group 02/15/23   Group Focus: affirmation, clarity of thought, and goals/reality orientation Treatment Modality:  Psychoeducation Interventions utilized were assignment, group exercise, and support Purpose: To be able to understand and verbalize the reason for their admission to the hospital. To understand that the medication helps with their chemical imbalance but they also need to work on their choices in life. To be challenged to develop a list of 30 positives about themselves. Also introduce the concept that "feelings" are not reality.  Participation Level:  Active  Participation Quality:  Appropriate  Affect:  Appropriate  Cognitive:  Appropriate  Insight:  Improving  Engagement in Group:  Engaged  Additional Comments:  Pt rates his energy at a 3/10. Participated in the group. Interrupted several other patients in the group.  Joe Lawrence

## 2023-02-15 NOTE — Progress Notes (Signed)
   02/14/23 2300  Psych Admission Type (Psych Patients Only)  Admission Status Voluntary  Psychosocial Assessment  Patient Complaints Shakiness  Eye Contact Fair  Facial Expression Anxious  Affect Anxious  Speech Rapid;Incoherent  Interaction Assertive  Motor Activity Tremors;Restless  Appearance/Hygiene Unremarkable  Behavior Characteristics Cooperative;Appropriate to situation  Mood Anxious;Pleasant  Thought Process  Coherency WDL  Content WDL  Delusions None reported or observed  Perception WDL  Hallucination None reported or observed  Judgment Poor  Confusion None  Danger to Self  Current suicidal ideation? Denies  Agreement Not to Harm Self Yes  Description of Agreement verbal  Danger to Others  Danger to Others None reported or observed
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
## 2023-02-15 NOTE — [facility] Group Notes (Signed)
.  Psychoeducational Group Note    Date:  02/15/2023 Time:1300-1400    Purpose of Group: . The group focus' on teaching patients on how to identify their needs and their Life Skills:  A group where two lists are made. What people need and what are things that we do that are unhealthy. The lists are developed by the patients and it is explained that we often do the actions that are not healthy to get our list of needs met.  Goal:: to develop the coping skills needed to get their needs met  Participation Level:  Active  Participation Quality:  Appropriate  Affect:  Appropriate  Cognitive:  Oriented  Insight: Improving  Engagement in Group:  Engaged  Modes of Intervention:  Activity, Discussion, Education, and Support  Additional Comments:  Rates energy at a 4/10. Participated fully in the group.  Paulino Rily

## 2023-02-15 NOTE — Group Note (Signed)
Date:  02/15/2023 Time:  11:21 AM  Group Topic/Focus:  Self Care:   The focus of this group is to help patients understand the importance of self-care in order to improve or restore emotional, physical, spiritual, interpersonal, and financial health.    Participation Level:  Active  Participation Quality:  Appropriate  Affect:  Appropriate  Cognitive:  Appropriate  Insight: Appropriate  Engagement in Group:  Engaged  Modes of Intervention:  Problem-solving   Additional Comments:     Jerrye Beavers 02/15/2023, 11:21 AM

## 2023-02-15 NOTE — Progress Notes (Signed)
Patient anxious throughout day but states ativan taper has been helping and is feeling better today. Patient med compliant. Patient denies SI,HI, and A/V/H with no plan or intent. Patient verbalized being constipated and received miralax and encouraged to maintain hydrated. No s/s of current distress.

## 2023-02-15 NOTE — Progress Notes (Signed)
Pt complained of things crawling on his skin, pt has been restless the whole night and kept his room mate awake. Pt given 1 mg Ativan and 50 mg of vistaril due to CIWA being 17, will continue to monitor.

## 2023-02-15 NOTE — Plan of Care (Signed)
  Problem: Education: Goal: Mental status will improve Outcome: Progressing   Problem: Health Behavior/Discharge Planning: Goal: Compliance with treatment plan for underlying cause of condition will improve Outcome: Progressing   Problem: Safety: Goal: Periods of time without injury will increase Outcome: Progressing

## 2023-02-16 DIAGNOSIS — F322 Major depressive disorder, single episode, severe without psychotic features: Secondary | ICD-10-CM | POA: Diagnosis not present

## 2023-02-16 LAB — COMPREHENSIVE METABOLIC PANEL
ALT: 127 U/L — ABNORMAL HIGH (ref 0–44)
AST: 38 U/L (ref 15–41)
Albumin: 4.5 g/dL (ref 3.5–5.0)
Alkaline Phosphatase: 55 U/L (ref 38–126)
Anion gap: 6 (ref 5–15)
BUN: 15 mg/dL (ref 6–20)
CO2: 23 mmol/L (ref 22–32)
Calcium: 9.1 mg/dL (ref 8.9–10.3)
Chloride: 105 mmol/L (ref 98–111)
Creatinine, Ser: 0.8 mg/dL (ref 0.61–1.24)
GFR, Estimated: >60 mL/min (ref >60)
Glucose, Bld: 107 mg/dL — ABNORMAL HIGH (ref 70–99)
Potassium: 3.6 mmol/L (ref 3.5–5.1)
Sodium: 134 mmol/L — ABNORMAL LOW (ref 135–145)
Total Bilirubin: 0.8 mg/dL (ref 0.3–1.2)
Total Protein: 7.2 g/dL (ref 6.5–8.1)

## 2023-02-16 MED ORDER — QUETIAPINE FUMARATE 25 MG PO TABS
25.0000 mg | ORAL_TABLET | Freq: Two times a day (BID) | ORAL | Status: DC
  Administered 2023-02-16 – 2023-02-17 (×3): 25 mg via ORAL
  Filled 2023-02-16 (×9): qty 1

## 2023-02-16 NOTE — [facility] Group Notes (Signed)
Adult Psychoeducational Group Note Date:  02/16/2023 Time:  0900-1000 Group Topic/Focus: PROGRESSIVE RELAXATION. A group where deep breathing is taught and tensing and relaxation muscle groups is used. Imagery is used as well.  Pts are asked to imagine 3 pillars that hold them up when they are not able to hold themselves up and to share that with the group.   Participation Level:  Active  Participation Quality:  Appropriate  Affect:  Appropriate  Cognitive:  Approprate  Insight: Improving  Engagement in Group:  Engaged  Modes of Intervention:  deep breathing, Imagery. Discussion  Additional Comments:  Rates his energy at a 3/10 and states his higher power holds him up.   : Paulino Rily

## 2023-02-16 NOTE — Progress Notes (Signed)
   02/15/23 2300  Psych Admission Type (Psych Patients Only)  Admission Status Voluntary  Psychosocial Assessment  Patient Complaints Apathy;Worrying;Shakiness  Eye Contact Fair  Facial Expression Anxious;Animated  Affect Anxious  Speech Logical/coherent  Interaction Assertive  Motor Activity Fidgety;Tremors  Appearance/Hygiene Unremarkable  Behavior Characteristics Fidgety  Mood Anxious  Thought Process  Coherency WDL  Content WDL  Delusions None reported or observed  Perception WDL  Hallucination None reported or observed  Judgment Limited  Confusion None  Danger to Self  Current suicidal ideation? Denies  Agreement Not to Harm Self Yes  Description of Agreement verbal  Danger to Others  Danger to Others None reported or observed

## 2023-02-16 NOTE — Progress Notes (Signed)
Patient med compliant and out in milieu observed socializing. Patient remains with slight tremors but improvement verbalized with overall sleep and anxiety. CIWA score 3. Patient cooperative on unit and in no current distress.

## 2023-02-16 NOTE — Group Note (Signed)
Date:  02/16/2023 Time:  5:08 PM  Group Topic/Focus:  Goals Group:   The focus of this group is to help patients establish daily goals to achieve during treatment and discuss how the patient can incorporate goal setting into their daily lives to aide in recovery.    Participation Level:  Active  Participation Quality:  Appropriate  Affect:  Appropriate  Cognitive:  Appropriate  Insight: Appropriate  Engagement in Group:  Engaged  Modes of Intervention:  Exploration  Additional Comments:   Patient explored the use of "I messages" in order to have better communication with others.  Jerrye Beavers 02/16/2023, 5:08 PM

## 2023-02-16 NOTE — Plan of Care (Signed)
  Problem: Activity: Goal: Sleeping patterns will improve Outcome: Progressing   Problem: Health Behavior/Discharge Planning: Goal: Compliance with treatment plan for underlying cause of condition will improve Outcome: Progressing   Problem: Safety: Goal: Periods of time without injury will increase Outcome: Progressing   Problem: Education: Goal: Knowledge of disease or condition will improve Outcome: Progressing

## 2023-02-16 NOTE — Progress Notes (Signed)
Pt reports that he wasn't feeling well in the morning because of jitteriness given when he was administered his previous ativan dose. Pt does report decreased anxiety tonight, but he still does have slight tremors to his upper extremities and feels as if something is crawling on his skin. Pt said that he has been distracting himself by being present in the milieu and interacting with his peers. Pt said that he had been taking klonopin for about a year and was prescribed it for his anxiety. Pt denies SI/HI and AVH. Active listening, reassurance, and support provided. Q 15 min safety checks continue. Pt's safety has been maintained.   02/16/23 1945  Psych Admission Type (Psych Patients Only)  Admission Status Voluntary  Psychosocial Assessment  Patient Complaints Anxiety;Shakiness;Substance abuse;Worrying  Eye Contact Fair  Facial Expression Anxious;Animated  Affect Anxious;Appropriate to circumstance  Speech Logical/coherent  Interaction Assertive  Motor Activity Fidgety;Tremors  Appearance/Hygiene Unremarkable  Behavior Characteristics Cooperative;Appropriate to situation;Anxious;Fidgety  Mood Anxious;Pleasant  Thought Process  Coherency WDL  Content WDL  Delusions None reported or observed  Perception WDL  Hallucination None reported or observed  Judgment Limited  Confusion None  Danger to Self  Current suicidal ideation? Denies  Agreement Not to Harm Self Yes  Description of Agreement verbally contracts for safety  Danger to Others  Danger to Others None reported or observed

## 2023-02-16 NOTE — Progress Notes (Signed)
[employer] Notes:  (Nursing/MHT/Case Management/Adjunct)  Date:  02/16/2023  Time:  2000  Type of Therapy:   wrap up group  Participation Level:  Active  Participation Quality:  Appropriate, Attentive, Sharing, and Supportive  Affect:  Anxious  Cognitive:  Alert  Insight:  Improving  Engagement in Group:  Engaged  Modes of Intervention:  Clarification, Education, and Support  Summary of Progress/Problems: Positive thinking, self-care, and positive change were discussed.   Joe Lawrence 02/16/2023, 10:49 PM
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
## 2023-02-16 NOTE — [facility] Group Notes (Signed)
Adult Psychoeducational Group  Date:  02/16/2023 Time: 1300-1400  Group Topic/Focus: Continuation of the group from Saturday. Looking at the lists that were created and talking about what needs to be done with the homework of 30 positives about themselves.                                     Talking about taking their power back and helping themselves to develop a positive self esteem.      Participation Quality:  Appropriate  Affect:  Appropriate  Cognitive:  Oriented  Insight: Improving  Engagement in Group:  Engaged  Modes of Intervention:  Activity, Discussion, Education, and Support  Additional Comments:  Pt came in late. But once in participated in the group.  Paulino Rily

## 2023-02-16 NOTE — Progress Notes (Signed)
Pt moved to a different room due to a conflict with room mate. Pt paces  a lot at night in the room. The room mate complained of not being able to sleep well. Will continue to monitor.

## 2023-02-17 DIAGNOSIS — F322 Major depressive disorder, single episode, severe without psychotic features: Secondary | ICD-10-CM | POA: Diagnosis not present

## 2023-02-17 MED ORDER — CLONIDINE HCL 0.1 MG PO TABS
0.1000 mg | ORAL_TABLET | Freq: Two times a day (BID) | ORAL | Status: DC | PRN
  Administered 2023-02-19 – 2023-02-24 (×3): 0.1 mg via ORAL
  Filled 2023-02-17 (×3): qty 1

## 2023-02-17 MED ORDER — QUETIAPINE FUMARATE 50 MG PO TABS
50.0000 mg | ORAL_TABLET | Freq: Two times a day (BID) | ORAL | Status: DC
  Administered 2023-02-18 – 2023-02-25 (×15): 50 mg via ORAL
  Filled 2023-02-17 (×18): qty 1

## 2023-02-17 MED ORDER — QUETIAPINE FUMARATE 100 MG PO TABS
100.0000 mg | ORAL_TABLET | Freq: Every day | ORAL | Status: DC
  Administered 2023-02-17 – 2023-02-23 (×7): 100 mg via ORAL
  Filled 2023-02-17 (×11): qty 1

## 2023-02-17 NOTE — Progress Notes (Signed)
Patient rated his day as a 4 out of 10 and went outside for fresh air today. His positive event for the day is that he completed all of his goals.

## 2023-02-17 NOTE — [facility] Group Notes (Signed)
Spiritual care group on grief and loss facilitated by Chaplain Janne Napoleon, B[facility] and Lysle Morales, counseling intern.  Group Goal: Support / Education around grief and loss  Members engage in facilitated group support and psycho-social education.  Group Description:  Following introductions and group rules, group members engaged in facilitated group dialogue and support around topic of loss, with particular support around experiences of loss in their lives. Group Identified types of loss (relationships / self / things) and identified patterns, circumstances, and changes that precipitate losses. Reflected on thoughts / feelings around loss, normalized grief responses, and recognized variety in grief experience. Group encouraged individual reflection on safe space and on the coping skills that they are already utilizing.  Group drew on Adlerian / Rogerian and narrative framework  Patient Progress: Joe Lawrence attended group and actively engaged in group conversation and activities.  He shared about the anticipatory grief of losing his mom as he is her main caregiver.  He also shared about the stress of having his siblings not see what his mom is going through and the loneliness of caregiving.  [address] [place] Pager, [phone]
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
## 2023-02-17 NOTE — Group Note (Signed)
Recreation Therapy Group Note   Group Topic:Stress Management  Group Date: 02/17/2023 Start Time: 0940 End Time: 1014 Facilitators: Abednego Yeates-McCall, LRT,CTRS Location: [address] Dayroom   Goal Area(s) Addresses:  Patient will actively participate in stress management techniques presented during session.  Patient will successfully identify benefit of practicing stress management post d/c.   Group Description: Guided Imagery. LRT provided education, instruction, and demonstration on practice of visualization via guided imagery. Patient was asked to participate in the technique introduced during session. LRT debriefed including topics of mindfulness, stress management and specific scenarios each patient could use these techniques. Patients were given suggestions of ways to access scripts post d/c and encouraged to explore Youtube and other apps available on smartphones, tablets, and computers.   Affect/Mood: Appropriate   Participation Level: Engaged   Participation Quality: Independent   Behavior: Appropriate   Speech/Thought Process: Focused   Insight: Good   Judgement: Good   Modes of Intervention: Script   Patient Response to Interventions:  Engaged   Education Outcome:  Acknowledges education and In group clarification offered    Clinical Observations/Individualized Feedback: Pt attended and participated in group.     Plan: Continue to engage patient in RT group sessions 2-3x/week.   Joe Lawrence, LRT,CTRS  02/17/2023 1:05 PM

## 2023-02-17 NOTE — Group Note (Signed)
Occupational Therapy Group Note  Group Topic: Sleep Hygiene  Group Date: 02/17/2023 Start Time: 1430 End Time: 1505 Facilitators: Brantley Stage, OT   Group Description: Group encouraged increased participation and engagement through topic focused on sleep hygiene. Patients reflected on the quality of sleep they typically receive and identified areas that need improvement. Group was given background information on sleep and sleep hygiene, including common sleep disorders. Group members also received information on how to improve one's sleep and introduced a sleep diary as a tool that can be utilized to track sleep quality over a length of time. Group session ended with patients identifying one or more strategies they could utilize or implement into their sleep routine in order to improve overall sleep quality.        Therapeutic Goal(s):  Identify one or more strategies to improve overall sleep hygiene  Identify one or more areas of sleep that are negatively impacted (sleep too much, too little, etc)     Participation Level: Active and Engaged   Participation Quality: Independent   Behavior: Appropriate   Speech/Thought Process: Relevant   Affect/Mood: Appropriate   Insight: Fair   Judgement: Fair   Individualization: pt was engaged in their participation of group discussion/activity. New skills identified  Modes of Intervention: Discussion  Patient Response to Interventions:  Attentive   Plan: Continue to engage patient in OT groups 2 - 3x/week.  02/17/2023  Brantley Stage, OT  Cornell Barman, OT

## 2023-02-17 NOTE — Progress Notes (Signed)
   02/17/23 0600  15 Minute Checks  Location Bedroom  Visual Appearance Calm  Behavior Composed  Sleep (Behavioral Health Patients Only)  Calculate sleep? (Click Yes once per 24 hr at 0600 safety check) Yes  Documented sleep last 24 hours 6.5

## 2023-02-17 NOTE — Progress Notes (Addendum)
D. Upon initial approach at the med window, pt presented with an anxious affect, mildly tremulous, with continued complaints of 'pins and needles', stating that his withdrawal symptoms were typically worse in the morning. Per pt's self inventory, pt rated his depression,hopelessness and anxiety a 5/6/6, respectively. Pt has been visible in the milieu interacting well with peers/staff and attending groups.  Pt currently denies SI/HI and AVH .  A. Labs and vitals monitored. Pt given and educated on medications. Pt supported emotionally and encouraged to express concerns and ask questions.   R. Pt remains safe with 15 minute checks. Will continue POC.

## 2023-02-18 DIAGNOSIS — F322 Major depressive disorder, single episode, severe without psychotic features: Secondary | ICD-10-CM | POA: Diagnosis not present

## 2023-02-18 MED ORDER — CLONIDINE HCL 0.1 MG PO TABS
0.1000 mg | ORAL_TABLET | Freq: Every day | ORAL | Status: AC
  Administered 2023-02-18 – 2023-02-19 (×2): 0.1 mg via ORAL
  Filled 2023-02-18 (×3): qty 1

## 2023-02-18 NOTE — Plan of Care (Signed)
  Problem: Education: Goal: Emotional status will improve Outcome: Progressing Goal: Mental status will improve Outcome: Progressing   Problem: Coping: Goal: Ability to verbalize frustrations and anger appropriately will improve Outcome: Progressing   Problem: Safety: Goal: Periods of time without injury will increase Outcome: Progressing   Problem: Education: Goal: Utilization of techniques to improve thought processes will improve Outcome: Progressing   Problem: Coping: Goal: Coping ability will improve Outcome: Progressing   Problem: Safety: Goal: Ability to disclose and discuss suicidal ideas will improve Outcome: Progressing

## 2023-02-18 NOTE — BHH Group Notes (Signed)
Adult Psychoeducational Group Note  Date:  02/18/2023 Time:  12:22 PM  Group Topic/Focus:  Goals Group:   The focus of this group is to help patients establish daily goals to achieve during treatment and discuss how the patient can incorporate goal setting into their daily lives to aide in recovery.  Participation Level:  Did Not Attend  Participation Quality:   Did not attend  Affect:   Did not attend  Cognitive:   Did not attend  Insight: None  Engagement in Group:   Did not attend  Modes of Intervention:   Did not attend  Additional Comments:  Pt did not attend goal group due to being in bed.  Jissell Trafton, Georgiann Mccoy 02/18/2023, 12:22 PM

## 2023-02-18 NOTE — Progress Notes (Signed)
   02/18/23 0900  Charting Type  Charting Type Shift assessment  Safety Check Verification  Has the RN verified the 15 minute safety check completion? Yes  Neurological  Neuro (WDL) WDL  Neuro Symptoms Anxiety;Tremors  HEENT  HEENT (WDL) WDL  Respiratory  Respiratory (WDL) WDL  Cardiac  Cardiac (WDL) WDL  Vascular  Vascular (WDL) WDL  Integumentary  Integumentary (WDL) WDL  Braden Scale (Ages 8 and up)  Sensory Perceptions 4  Moisture 4  Activity 4  Mobility 4  Nutrition 3  Friction and Shear 3  Braden Scale Score 22  Musculoskeletal  Musculoskeletal (WDL) WDL  Assistive Device None  Gastrointestinal  Gastrointestinal (WDL) WDL  GU Assessment  Genitourinary (WDL) WDL  Neurological  Level of Consciousness Alert

## 2023-02-18 NOTE — Progress Notes (Signed)
   02/18/23 0630  15 Minute Checks  Location Bedroom  Visual Appearance Calm  Behavior Composed  Sleep (Behavioral Health Patients Only)  Calculate sleep? (Click Yes once per 24 hr at 0600 safety check) Yes  Documented sleep last 24 hours 6.5

## 2023-02-18 NOTE — Group Note (Signed)
Recreation Therapy Group Note   Group Topic:Animal Assisted Therapy   Group Date: 02/18/2023 Start Time: 1430 End Time: 1500 Facilitators: Marishka Rentfrow-McCall, LRT,CTRS Location: [address] Dayroom   Animal-Assisted Activity (AAA) Program Checklist/Progress Notes Patient Eligibility Criteria Checklist & Daily Group note for Rec Tx Intervention  AAA/T Program Assumption of Risk Form signed by Patient/ or Parent Legal Guardian Yes  Patient is free of allergies or severe asthma Yes  Patient reports no fear of animals Yes  Patient reports no history of cruelty to animals Yes  Patient understands his/her participation is voluntary Yes  Patient washes hands before animal contact Yes  Patient washes hands after animal contact Yes   Affect/Mood: Appropriate   Participation Level: Engaged   Participation Quality: Independent   Behavior: Appropriate    Clinical Observations/Individualized Feedback:  Patient attended session and interacted appropriately with therapy dog and peers. Patient asked appropriate questions about therapy dog and his training. Patient shared stories about their pets at home with group.    Plan: Continue to engage patient in RT group sessions 2-3x/week.   Ona Roehrs-McCall, LRT,CTRS 02/18/2023 4:16 PM

## 2023-02-18 NOTE — Progress Notes (Signed)
   02/17/23 2115  Psych Admission Type (Psych Patients Only)  Admission Status Voluntary  Psychosocial Assessment  Patient Complaints Anxiety;Depression;Shakiness;Substance abuse  Eye Contact Fair  Facial Expression Anxious  Affect Anxious;Depressed  Speech Logical/coherent  Interaction Assertive  Motor Activity Tremors  Appearance/Hygiene Unremarkable  Behavior Characteristics Appropriate to situation;Cooperative  Mood Anxious;Pleasant  Thought Process  Coherency WDL  Content WDL  Delusions None reported or observed  Perception WDL  Hallucination None reported or observed  Judgment Limited  Confusion None  Danger to Self  Current suicidal ideation? Denies  Agreement Not to Harm Self Yes  Description of Agreement Verbal contract  Danger to Others  Danger to Others None reported or observed

## 2023-02-18 NOTE — Group Note (Signed)
LCSW Group Therapy Note   Group Date: 02/18/2023 Start Time: 1100 End Time: 1200  Type of Group and Topic: Psychoeducational Group: Discharge Planning  Participation Level: Active  Description of Group Discharge planning group reviews patient's anticipated discharge plans and assists patients to anticipate and address any barriers to wellness/recovery in the community. Suicide prevention education is reviewed with patients in group. Patients will receive a worksheet of the 5W's (Who, What, When, Where, How).  The Pt will discuss supports, coping skills, and their motivations for change.   Therapeutic Goals  1. Patients will state their anticipated discharge plan and mental health aftercare  2. Patients will identify potential barriers to wellness in the community setting  3. Patients will engage in problem solving, solution focused discussion of ways to anticipate and address barriers to wellness/recovery  Summary of Patient Progress: The Pt attended group and remained there the entire time. The Pt accepted all worksheets and materials that were provided.  The Pt expressed understanding of the topic being discussed by asking questions and sharing their thoughts and feelings with other group members.  The Pt was appropriate with their peers and staff.   Darleen Crocker, LCSWA 02/18/2023  1:22 PM
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
## 2023-02-18 NOTE — Progress Notes (Signed)
Adult Psychoeducational Group Note  Date:  02/18/2023 Time:  9:57 PM  Group Topic/Focus:  Wrap-Up Group:   The focus of this group is to help patients review their daily goal of treatment and discuss progress on daily workbooks.  Participation Level:  Active  Participation Quality:  Appropriate  Affect:  Appropriate  Cognitive:  Appropriate  Insight: Appropriate  Engagement in Group:  Engaged  Modes of Intervention:   Education  Additional Comments:  Patient attended and participated in group tonight. He report that he learn about anger management.  Joe Lawrence [facility] 02/18/2023, 9:57 PM

## 2023-02-18 NOTE — Plan of Care (Signed)
  Problem: Education: Goal: Emotional status will improve Outcome: Progressing Goal: Mental status will improve Outcome: Progressing   

## 2023-02-19 ENCOUNTER — Encounter (HOSPITAL_COMMUNITY): Payer: Self-pay

## 2023-02-19 DIAGNOSIS — F322 Major depressive disorder, single episode, severe without psychotic features: Secondary | ICD-10-CM | POA: Diagnosis not present

## 2023-02-19 MED ORDER — LORAZEPAM 0.5 MG PO TABS
0.5000 mg | ORAL_TABLET | Freq: Two times a day (BID) | ORAL | Status: AC
  Administered 2023-02-21 – 2023-02-22 (×2): 0.5 mg via ORAL
  Filled 2023-02-19 (×2): qty 1

## 2023-02-19 MED ORDER — LORAZEPAM 1 MG PO TABS
1.0000 mg | ORAL_TABLET | Freq: Two times a day (BID) | ORAL | Status: AC
  Administered 2023-02-20 – 2023-02-21 (×2): 1 mg via ORAL
  Filled 2023-02-19 (×2): qty 1

## 2023-02-19 MED ORDER — LORAZEPAM 1 MG PO TABS
1.0000 mg | ORAL_TABLET | Freq: Three times a day (TID) | ORAL | Status: AC
  Administered 2023-02-19 – 2023-02-20 (×3): 1 mg via ORAL
  Filled 2023-02-19 (×3): qty 1

## 2023-02-19 NOTE — Plan of Care (Signed)
  Problem: Health Behavior/Discharge Planning: Goal: Compliance with therapeutic regimen will improve Outcome: Progressing   Problem: Self-Concept: Goal: Level of anxiety will decrease Outcome: Progressing   Problem: Education: Goal: Knowledge of disease or condition will improve Outcome: Progressing Goal: Understanding of discharge needs will improve Outcome: Progressing   Problem: Health Behavior/Discharge Planning: Goal: Identification of resources available to assist in meeting health care needs will improve Outcome: Progressing   Problem: Safety: Goal: Ability to remain free from injury will improve Outcome: Progressing

## 2023-02-19 NOTE — Progress Notes (Signed)
   02/19/23 2215  Psych Admission Type (Psych Patients Only)  Admission Status Voluntary  Psychosocial Assessment  Patient Complaints Anxiety;Depression  Eye Contact Fair  Facial Expression Anxious;Worried  Affect Anxious  Theatre stage manager  Appearance/Hygiene Unremarkable  Behavior Characteristics Anxious  Mood Anxious  Aggressive Behavior  Effect No apparent injury  Thought Process  Coherency WDL  Content WDL  Delusions WDL  Perception WDL  Hallucination None reported or observed  Judgment Poor  Confusion WDL  Danger to Self  Current suicidal ideation? Passive  Agreement Not to Harm Self Yes  Description of Agreement verbal contract for safety  Danger to Others  Danger to Others None reported or observed

## 2023-02-19 NOTE — BH IP Treatment Plan (Signed)
Interdisciplinary Treatment and Diagnostic Plan Update  02/19/2023 Time of Session: 10:20am  Makana Poppen MRN: [id number]  Principal Diagnosis: Severe major depression, single episode, without psychotic features [facility])  Secondary Diagnoses: Principal Problem:   Severe major depression, single episode, without psychotic features ([place]) Active Problems:   Opioid use disorder, severe, dependence ([place])   Severe benzodiazepine use disorder ([place])   Generalized anxiety disorder   Current Medications:  Current Facility-Administered Medications  Medication Dose Route Frequency Provider Last Rate Last Admin   acetaminophen (TYLENOL) tablet 650 mg  650 mg Oral Q6H PRN Ranae Palms, MD   650 mg at 02/19/23 0836   alum & mag hydroxide-simeth (MAALOX/MYLANTA) 200-200-20 MG/5ML suspension 30 mL  30 mL Oral Q4H PRN Ranae Palms, MD       cloNIDine (CATAPRES) tablet 0.1 mg  0.1 mg Oral BID PRN Nicholes Rough, NP   0.1 mg at 02/19/23 [id number]   cloNIDine (CATAPRES) tablet 0.1 mg  0.1 mg Oral Daily Nicholes Rough, NP   0.1 mg at 02/18/23 2103   escitalopram (LEXAPRO) tablet 20 mg  20 mg Oral Daily Lindell Spar I, NP   20 mg at 02/19/23 0758   fluticasone (FLOVENT HFA) 220 MCG/ACT inhaler 2 puff  2 puff Inhalation BID PRN Lindell Spar I, NP       gabapentin (NEURONTIN) capsule 600 mg  600 mg Oral QID Evette Georges, NP   600 mg at 02/19/23 1216   lipase/protease/amylase (CREON) capsule 12,000 Units  12,000 Units Oral TID WC Lindell Spar I, NP   12,000 Units at 02/19/23 1216   magnesium hydroxide (MILK OF MAGNESIA) suspension 30 mL  30 mL Oral Daily PRN Ranae Palms, MD       multivitamin with minerals tablet 1 tablet  1 tablet Oral Daily Lindell Spar I, NP   1 tablet at 02/19/23 0758   olopatadine (PATANOL) 0.1 % ophthalmic solution 1 drop  1 drop Both Eyes Daily PRN Lindell Spar I, NP       pantoprazole (PROTONIX) EC tablet 40 mg  40 mg Oral BID Lindell Spar I, NP   40 mg at 02/19/23 0758    polyethylene glycol (MIRALAX / GLYCOLAX) packet 17 g  17 g Oral Daily PRN Lindell Spar I, NP   17 g at 02/17/23 0751   QUEtiapine (SEROQUEL) tablet 100 mg  100 mg Oral QHS Nicholes Rough, NP   100 mg at 02/18/23 2103   QUEtiapine (SEROQUEL) tablet 50 mg  50 mg Oral BID Nicholes Rough, NP   50 mg at 02/19/23 1303   senna-docusate (Senokot-S) tablet 1 tablet  1 tablet Oral QHS Nicholes Rough, NP   1 tablet at 02/17/23 2112   thiamine (Vitamin B-1) tablet 100 mg  100 mg Oral Daily Lindell Spar I, NP   100 mg at 02/19/23 0759   PTA Medications: Facility-Administered Medications Prior to Admission  Medication Dose Route Frequency Provider Last Rate Last Admin   lidocaine (XYLOCAINE) 1 % (with pres) injection 10 mL  10 mL Intradermal Once Denita Lung, MD       Medications Prior to Admission  Medication Sig Dispense Refill Last Dose   clonazePAM (KLONOPIN) 1 MG tablet Take 1 mg by mouth 4 (four) times daily as needed.      escitalopram (LEXAPRO) 20 MG tablet Take 20 mg by mouth daily.      fluticasone (FLOVENT HFA) 220 MCG/ACT inhaler USE 2 PUFFS TWICE DAILY. (Patient taking differently: Inhale 2 puffs into the  lungs 2 (two) times daily as needed (shortness of breath).) 36 g 3    gabapentin (NEURONTIN) 600 MG tablet Take 600 mg by mouth 4 (four) times daily.      Olopatadine HCl (PATADAY OP) Place 1 drop into both eyes daily as needed (itching).      pantoprazole (PROTONIX) 40 MG tablet Take 1 tablet (40 mg total) by mouth 2 (two) times daily. 180 tablet 3    polyethylene glycol (MIRALAX / GLYCOLAX) 17 g packet Take 17 g by mouth daily as needed for mild constipation.      XIIDRA 5 % SOLN Apply 1 drop to eye 2 (two) times daily as needed (dry eyes).       Patient Stressors: Substance abuse    Patient Strengths: Engineer, site for treatment/growth  Supportive family/friends   Treatment Modalities: Medication Management, Group therapy, Case  management,  1 to 1 session with clinician, Psychoeducation, Recreational therapy.   Physician Treatment Plan for Primary Diagnosis: Severe major depression, single episode, without psychotic features ([place]) Long Term Goal(s): Improvement in symptoms so as ready for discharge   Short Term Goals: Ability to identify and develop effective coping behaviors will improve Ability to maintain clinical measurements within normal limits will improve Compliance with prescribed medications will improve Ability to identify triggers associated with substance abuse/mental health issues will improve Ability to identify changes in lifestyle to reduce recurrence of condition will improve Ability to verbalize feelings will improve Ability to disclose and discuss suicidal ideas Ability to demonstrate self-control will improve  Medication Management: Evaluate patient's response, side effects, and tolerance of medication regimen.  Therapeutic Interventions: 1 to 1 sessions, Unit Group sessions and Medication administration.  Evaluation of Outcomes: Progressing  Physician Treatment Plan for Secondary Diagnosis: Principal Problem:   Severe major depression, single episode, without psychotic features ([place]) Active Problems:   Opioid use disorder, severe, dependence ([facility])   Severe benzodiazepine use disorder ([facility])   Generalized anxiety disorder  Long Term Goal(s): Improvement in symptoms so as ready for discharge   Short Term Goals: Ability to identify and develop effective coping behaviors will improve Ability to maintain clinical measurements within normal limits will improve Compliance with prescribed medications will improve Ability to identify triggers associated with substance abuse/mental health issues will improve Ability to identify changes in lifestyle to reduce recurrence of condition will improve Ability to verbalize feelings will improve Ability to disclose and discuss suicidal ideas Ability  to demonstrate self-control will improve     Medication Management: Evaluate patient's response, side effects, and tolerance of medication regimen.  Therapeutic Interventions: 1 to 1 sessions, Unit Group sessions and Medication administration.  Evaluation of Outcomes: Progressing   RN Treatment Plan for Primary Diagnosis: Severe major depression, single episode, without psychotic features ([place]) Long Term Goal(s): Knowledge of disease and therapeutic regimen to maintain health will improve  Short Term Goals: Ability to remain free from injury will improve, Ability to participate in decision making will improve, Ability to verbalize feelings will improve, Ability to disclose and discuss suicidal ideas, and Ability to identify and develop effective coping behaviors will improve  Medication Management: RN will administer medications as ordered by provider, will assess and evaluate patient's response and provide education to patient for prescribed medication. RN will report any adverse and/or side effects to prescribing provider.  Therapeutic Interventions: 1 on 1 counseling sessions, Psychoeducation, Medication administration, Evaluate responses to treatment, Monitor vital signs and CBGs as ordered, Perform/monitor  CIWA, COWS, AIMS and Fall Risk screenings as ordered, Perform wound care treatments as ordered.  Evaluation of Outcomes: Progressing   LCSW Treatment Plan for Primary Diagnosis: Severe major depression, single episode, without psychotic features ([place]) Long Term Goal(s): Safe transition to appropriate next level of care at discharge, Engage patient in therapeutic group addressing interpersonal concerns.  Short Term Goals: Engage patient in aftercare planning with referrals and resources, Increase social support, Increase emotional regulation, Facilitate acceptance of mental health diagnosis and concerns, Identify triggers associated with mental health/substance abuse issues, and  Increase skills for wellness and recovery  Therapeutic Interventions: Assess for all discharge needs, 1 to 1 time with Social worker, Explore available resources and support systems, Assess for adequacy in community support network, Educate family and significant other(s) on suicide prevention, Complete Psychosocial Assessment, Interpersonal group therapy.  Evaluation of Outcomes: Progressing   Progress in Treatment: Attending groups: Yes. Participating in groups: Yes. Taking medication as prescribed: No. Toleration medication: Yes. Family/Significant other contact made: Yes, individual(s) contacted:  Marinus Maw [phone] (Mother) Patient understands diagnosis: Yes. Discussing patient identified problems/goals with staff: Yes. Medical problems stabilized or resolved: Yes. Denies suicidal/homicidal ideation: Yes. Issues/concerns per patient self-inventory: No.     New problem(s) identified: No, Describe:  none reported   New Short Term/Long Term Goal(s):    medication stabilization, elimination of SI thoughts, development of comprehensive mental wellness plan.      Patient Goals:  Pt states, "I want to stabilize on medications, get my health on track and get into a halfway house."   Discharge Plan or Barriers:  Patient recently admitted. CSW will continue to follow and assess for appropriate referrals and possible discharge planning.      Reason for Continuation of Hospitalization: Anxiety Depression Medication stabilization Suicidal ideation Withdrawal symptoms   Estimated Length of Stay: 5- 7 days  Last [place] Suicide Severity Risk Score: [place] Admission (Current) from 02/13/2023 in [place] [facility]  C-SSRS RISK CATEGORY High Risk       Last PHQ 2/9 Scores:    03/22/2022    3:09 PM 05/11/2021    9:27 AM 11/08/2020    2:49 PM  Depression screen PHQ 2/9  Decreased Interest 3 0 2  Down, Depressed, Hopeless 3 0 3  PHQ -  2 Score 6 0 5  Altered sleeping 3 0 3  Tired, decreased energy 3 0 3  Change in appetite 0 0 0  Feeling bad or failure about yourself  3 0 0  Trouble concentrating 3 3 3  $ Moving slowly or fidgety/restless 0 3 3  Suicidal thoughts 0 0 0  PHQ-9 Score 18 6 17  $ Difficult doing work/chores Very difficult Somewhat difficult Extremely dIfficult    Scribe for Treatment Team: Darleen Crocker, Latanya Presser 02/19/2023 1:23 PM

## 2023-02-19 NOTE — Progress Notes (Signed)
The patient attended the evening N.A.meeting and was appropriate.

## 2023-02-19 NOTE — Plan of Care (Signed)
  Problem: Health Behavior/Discharge Planning: Goal: Compliance with treatment plan for underlying cause of condition will improve Outcome: Progressing   Problem: Safety: Goal: Periods of time without injury will increase Outcome: Progressing

## 2023-02-19 NOTE — Progress Notes (Addendum)
Patient presenting anxious stating he has tremors and is not "doing well after stopping ativan." Patient claims to feel his skin crawling and is having a hard time coping since the ativan taper is complete. Patient also verbalized passive SI with a main trigger being his withdrawal symptoms. Patient's concerns addressed with MD and tx team. Patient still able to verbally contract for safety. Patient denies HI and A/V/H. Scheduled medication provided to patient and prn clonidine given due to elevated bp. At reassessment bp noted to be down to 131/95.   BP (!) 131/95   Pulse 71   Temp 98.5 F (36.9 C) (Oral)   Resp 18   Ht 5' 8"$  (1.727 m)   Wt 65.8 kg   SpO2 99%   BMI 22.05 kg/m
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
## 2023-02-19 NOTE — Progress Notes (Signed)
   02/18/23 2100  Psych Admission Type (Psych Patients Only)  Admission Status Voluntary  Psychosocial Assessment  Patient Complaints Anxiety;Depression;Insomnia;Shakiness  Eye Contact Fair  Facial Expression Anxious  Affect Anxious;Depressed  Speech Logical/coherent  Interaction Assertive;Attention-seeking  Motor Activity Tremors  Appearance/Hygiene Unremarkable  Behavior Characteristics Cooperative;Appropriate to situation  Mood Anxious;Pleasant  Thought Process  Coherency WDL  Content WDL  Delusions None reported or observed  Perception WDL  Hallucination None reported or observed  Judgment Limited  Confusion None  Danger to Self  Current suicidal ideation? Denies  Agreement Not to Harm Self Yes  Description of Agreement Verbal  Danger to Others  Danger to Others None reported or observed

## 2023-02-19 NOTE — Progress Notes (Signed)
   02/19/23 0600  15 Minute Checks  Location Bedroom  Visual Appearance Calm  Behavior Sleeping  Sleep (Behavioral Health Patients Only)  Calculate sleep? (Click Yes once per 24 hr at 0600 safety check) Yes  Documented sleep last 24 hours 7

## 2023-02-20 ENCOUNTER — Ambulatory Visit (HOSPITAL_COMMUNITY): Payer: Medicare Other | Admitting: Licensed Clinical Social Worker

## 2023-02-20 NOTE — Progress Notes (Signed)
   02/20/23 2130  Psych Admission Type (Psych Patients Only)  Admission Status Voluntary  Psychosocial Assessment  Patient Complaints Anxiety;Depression  Eye Contact Fair  Facial Expression Anxious;Worried  Affect Anxious  Insurance risk surveyor Cooperative;Anxious  Mood Depressed;Anxious  Aggressive Behavior  Effect No apparent injury  Thought Process  Coherency WDL  Content WDL  Delusions WDL  Perception WDL  Hallucination None reported or observed  Judgment Poor  Confusion WDL  Danger to Self  Current suicidal ideation? Passive  Agreement Not to Harm Self Yes  Description of Agreement verbal contract for safety  Danger to Others  Danger to Others None reported or observed

## 2023-02-20 NOTE — Plan of Care (Signed)
  Problem: Education: Goal: Mental status will improve Outcome: Progressing   Problem: Safety: Goal: Periods of time without injury will increase Outcome: Progressing

## 2023-02-20 NOTE — B[facility] Group Notes (Signed)
Pt attended wrap-up group  Pt rated day 5 out of 10

## 2023-02-20 NOTE — BHH Group Notes (Signed)
Adult Psychoeducational Group Note  Date:  02/20/2023 Time:  6:33 PM  Group Topic/Focus:  Spirituality:   The focus of this group is to discuss how one's spirituality can aide in recovery.  Participation Level:  Active  Participation Quality:  Appropriate and Attentive  Affect:  Appropriate  Cognitive:  Appropriate  Insight: Appropriate  Engagement in Group:  Engaged  Modes of Intervention:  Discussion  Additional Comments:  Patient was engaged throughout the entirety of group.  Victorino December 02/20/2023, 6:33 PM

## 2023-02-20 NOTE — Group Note (Signed)
LCSW Group Therapy Note   Group Date: 02/20/2023 Start Time: 1100 End Time: 1200  Type of Therapy and Topic:  Group Therapy:  Strengths Exploration   Participation Level: Active  Description of Group: This group allows individuals to explore their strengths, learn to use strengths in new ways to improve well-being. Strengths-based interventions involve identifying strengths, understanding how they are used, and learning new ways to apply them. Individuals will identify their strengths, and then explore their roles in different areas of life (relationships, professional life, and personal fulfillment). Individuals will think about ways in which they currently use their strengths, along with new ways they could begin using them.    Therapeutic Goals Patient will verbalize two of their strengths Patient will identify how their strengths are currently used Patient will identify two new ways to apply their strengths  Patients will create a plan to apply their strengths in their daily lives     Summary of Patient Progress:  The Pt attended group and remained there the entire time.  The Pt accepted all worksheets and materials.  The Pt demonstrated understanding of the topic being discussed by asking questions and participating in open discussion with their peers.  The Pt was appropriate with peers and staff.    Therapeutic Modalities Cognitive Behavioral Therapy Motivational Interviewing  Darleen Crocker, [place] 02/20/2023  1:04 PM

## 2023-02-20 NOTE — Progress Notes (Signed)
Patient stated he slept better last night. Patient states he is still anxious but not as much as yesterday and appears more at ease. Patient denies SI,HI, and A/V/H with no plan or intent at this time. Patient observed attending groups and socializing appropriately. No s/s of current distress.

## 2023-02-20 NOTE — Group Note (Signed)
Occupational Therapy Group Note  Group Topic:Coping Skills  Group Date: 02/20/2023 Start Time: 1430 End Time: 1500 Facilitators: Brantley Stage, OT   Group Description: Group encouraged increased engagement and participation through discussion and activity focused on "Coping Ahead." Patients were split up into teams and selected a card from a stack of positive coping strategies. Patients were instructed to act out/charade the coping skill for other peers to guess and receive points for their team. Discussion followed with a focus on identifying additional positive coping strategies and patients shared how they were going to cope ahead over the weekend while continuing hospitalization stay.  Therapeutic Goal(s): Identify positive vs negative coping strategies. Identify coping skills to be used during hospitalization vs coping skills outside of hospital/at home Increase participation in therapeutic group environment and promote engagement in treatment   Participation Level: Engaged   Participation Quality: Independent   Behavior: Appropriate   Speech/Thought Process: Relevant   Affect/Mood: Flat   Insight: Good   Judgement: Good   Individualization: pt was engaged in their participation of group discussion/activity. New skills identified  Modes of Intervention: Education  Patient Response to Interventions:  Attentive   Plan: Continue to engage patient in OT groups 2 - 3x/week.  02/20/2023  Brantley Stage, OT  Cornell Barman, OT

## 2023-02-20 NOTE — Group Note (Signed)
Date:  02/20/2023 Time:  9:49 AM  Group Topic/Focus:  Goals Group:   The focus of this group is to help patients establish daily goals to achieve during treatment and discuss how the patient can incorporate goal setting into their daily lives to aide in recovery. Orientation:   The focus of this group is to educate the patient on the purpose and policies of crisis stabilization and provide a format to answer questions about their admission.  The group details unit policies and expectations of patients while admitted.    Participation Level:  Active  Participation Quality:  Appropriate  Affect:  Appropriate  Cognitive:  Appropriate  Insight: Appropriate  Engagement in Group:  Engaged  Modes of Intervention:  Discussion  Additional Comments:  Pt wants to focus on positive behaviors and relationships.  Garvin Fila 02/20/2023, 9:49 AM
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
## 2023-02-20 NOTE — Progress Notes (Signed)
Adult Psychoeducational Group Note  Date:  02/20/2023 Time:  7:53 PM  Group Topic/Focus: Trivia  Participation Level:  Active  Participation Quality:  Appropriate  Affect:  Appropriate  Cognitive:  Appropriate  Insight: Appropriate  Engagement in Group:  Engaged  Modes of Intervention:  Discussion  Additional Comments:  Pt attended the trivia group and remained appropriate and engaged throughout the duration of the group.   Beryle Beams 02/20/2023, 7:53 PM

## 2023-02-21 MED ORDER — LISINOPRIL 10 MG PO TABS
10.0000 mg | ORAL_TABLET | Freq: Every day | ORAL | Status: DC
  Administered 2023-02-21 – 2023-02-25 (×5): 10 mg via ORAL
  Filled 2023-02-21 (×6): qty 1

## 2023-02-21 MED ORDER — HYDROXYZINE HCL 50 MG PO TABS
50.0000 mg | ORAL_TABLET | Freq: Four times a day (QID) | ORAL | Status: DC | PRN
  Administered 2023-02-22 – 2023-02-25 (×11): 50 mg via ORAL
  Filled 2023-02-21 (×11): qty 1
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
## 2023-02-21 NOTE — BHH Counselor (Signed)
BHH/BMU LCSW Progress Note   02/21/2023    3:54 PM  Joe Lawrence      Type of Note: Follow Up With [facility]   CSW spoke with [name] the intake coordinator regarding patient referral and [name] said that the nurse was still reviewing the referral, but will call CSW this evening or first thing Monday morning about admission.       Signed:   Silas Flood, MSW, [facility] 02/21/2023 3:54 PM

## 2023-02-21 NOTE — Progress Notes (Signed)
Adult Psychoeducational Group Note  Date:  02/21/2023 Time:  5:15 PM  Group Topic/Focus:  Goals Group:   The focus of this group is to help patients establish daily goals to achieve during treatment and discuss how the patient can incorporate goal setting into their daily lives to aide in recovery. Orientation:   The focus of this group is to educate the patient on the purpose and policies of crisis stabilization and provide a format to answer questions about their admission.  The group details unit policies and expectations of patients while admitted.  Participation Level:  Active  Participation Quality:  Appropriate  Affect:  Appropriate  Cognitive:  Appropriate  Insight: Appropriate  Engagement in Group:  Engaged  Modes of Intervention:  Discussion  Additional Comments:  Pt attended the goals/orientatinon group and remained appropriate and engaged throughout the duration of the group.   Beryle Beams 02/21/2023, 5:15 PM

## 2023-02-21 NOTE — Group Note (Signed)
Recreation Therapy Group Note   Group Topic:Problem Solving  Group Date: 02/21/2023 Start Time: 0930 End Time: 1000 Facilitators: Dezarai Prew-McCall, LRT,CTRS Location: [address] Dayroom   Goal Area(s) Addresses:  Patient will effectively work with peer towards shared goal.  Patient will identify skills used to make activity successful.  Patient will identify how skills used during activity can be applied to reach post d/c goals.   Group Description: [employer]. In teams of 5-6, patients were given 11 craft pipe cleaners. Using the materials provided, patients were instructed to compete again the opposing team(s) to build the tallest free-standing structure from floor level. The activity was timed; difficulty increased by Probation officer as Pharmacist, hospital continued.  Systematically resources were removed with additional directions for example, placing one arm behind their back, working in silence, and shape stipulations. LRT facilitated post-activity discussion reviewing team processes and necessary communication skills involved in completion. Patients were encouraged to reflect how the skills utilized, or not utilized, in this activity can be incorporated to positively impact support systems post discharge.   Affect/Mood: N/A   Participation Level: Did not attend    Clinical Observations/Individualized Feedback:     Plan: Continue to engage patient in RT group sessions 2-3x/week.   Joe Lawrence, LRT,CTRS  02/21/2023 12:49 PM

## 2023-02-21 NOTE — Progress Notes (Signed)
   02/21/23 2200  Psych Admission Type (Psych Patients Only)  Admission Status Voluntary  Psychosocial Assessment  Patient Complaints Anxiety  Eye Contact Fair  Facial Expression Anxious  Affect Anxious  Speech Logical/coherent  Interaction Assertive  Motor Activity Fidgety  Appearance/Hygiene Unremarkable  Behavior Characteristics Cooperative;Anxious;Fidgety  Mood Anxious  Aggressive Behavior  Effect No apparent injury  Thought Process  Coherency WDL  Content WDL  Delusions None reported or observed  Perception WDL  Hallucination None reported or observed  Judgment Poor  Confusion None  Danger to Self  Current suicidal ideation? Denies  Self-Injurious Behavior No self-injurious ideation or behavior indicators observed or expressed   Agreement Not to Harm Self Yes  Description of Agreement Verbal  Danger to Others  Danger to Others None reported or observed

## 2023-02-21 NOTE — Progress Notes (Addendum)
D: Pt denied SI/HI/AVH this morning. Pt rated his depression a 5/10, anxiety a 6/10.  Pt's BP elevated this morning, PRN Clonidine given to patient and provider made aware. Patient complained of tremor in hand r/t withdrawal symptoms. No tremor observed when observing patient by this RN. Pt has been pleasant, calm, and cooperative throughout the shift.   A: RN provided support and encouragement to patient. Pt given scheduled medications as prescribed. PRN Clonidine given for elevated BP. Q15 min checks verified for safety.    R: Patient verbally contracts for safety. Patient compliant with medications and treatment plan. Patient is interacting well on the unit. Pt is safe on the unit.   02/21/23 1100  Psych Admission Type (Psych Patients Only)  Admission Status Voluntary  Psychosocial Assessment  Patient Complaints Anxiety;Depression  Eye Contact Fair  Facial Expression Anxious;Worried  Affect Anxious;Depressed  Insurance risk surveyor Cooperative;Anxious  Mood Depressed;Anxious  Thought Process  Coherency WDL  Content WDL  Delusions WDL  Perception WDL  Hallucination None reported or observed  Judgment Impaired  Confusion WDL  Danger to Self  Current suicidal ideation? Denies  Self-Injurious Behavior No self-injurious ideation or behavior indicators observed or expressed   Agreement Not to Harm Self Yes  Description of Agreement Pt verbally contracts for safety  Danger to Others  Danger to Others None reported or observed

## 2023-02-22 MED ORDER — WHITE PETROLATUM EX OINT
TOPICAL_OINTMENT | CUTANEOUS | Status: AC
  Filled 2023-02-22: qty 5

## 2023-02-22 MED ORDER — PANCRELIPASE (LIP-PROT-AMYL) 12000-38000 UNITS PO CPEP
12000.0000 [IU] | ORAL_CAPSULE | Freq: Three times a day (TID) | ORAL | Status: DC
  Administered 2023-02-22 – 2023-02-25 (×9): 12000 [IU] via ORAL
  Filled 2023-02-22 (×13): qty 1

## 2023-02-22 NOTE — BHH Group Notes (Signed)
[facility] Group Notes:  (Nursing/MHT/Case Management/Adjunct)  Date:  02/22/2023  Time:  9:19 AM  Type of Therapy:  Group Therapy  Participation Level:  Active  Participation Quality:  Appropriate  Affect:  Appropriate  Cognitive:  Oriented  Insight:  Good  Engagement in Group:  Engaged  Modes of Intervention:  Discussion  Summary of Progress/Problems:  Joe Lawrence 02/22/2023, 9:19 AM

## 2023-02-22 NOTE — BHH Group Notes (Signed)
[place] Group Notes:  (Nursing/MHT/Case Management/Adjunct)  Date:  02/22/2023  Time:  9:18 AM  Type of Therapy:  Group Therapy  Participation Level:  Active  Participation Quality:  Appropriate  Affect:  Appropriate  Cognitive:  Oriented  Insight:  Good  Engagement in Group:  Engaged  Modes of Intervention:  Discussion  Summary of Progress/Problems:  Redmond Pulling 02/22/2023, 9:18 AM

## 2023-02-22 NOTE — Group Note (Unsigned)
Date:  02/22/2023 Time:  11:42 PM  Group Topic/Focus:  Wrap-Up Group:   The focus of this group is to help patients review their daily goal of treatment and discuss progress on daily workbooks.     Participation Level:  {BHH PARTICIPATION WO:[id number]  Participation Quality:  {BHH PARTICIPATION QUALITY:22265}  Affect:  {BHH AFFECT:22266}  Cognitive:  {BHH COGNITIVE:22267}  Insight: {BHH Insight2:20797}  Engagement in Group:  {BHH ENGAGEMENT IN BP:[id number]  Modes of Intervention:  {BHH MODES OF INTERVENTION:22269}  Additional Comments:  ***  Debe Coder 02/22/2023, 11:42 PM

## 2023-02-22 NOTE — Plan of Care (Signed)
  Problem: Education: Goal: Knowledge of [facility] Education information/materials will improve Outcome: Progressing Goal: Emotional status will improve Outcome: Progressing Goal: Mental status will improve Outcome: Progressing Goal: Verbalization of understanding the information provided will improve Outcome: Progressing   Problem: Activity: Goal: Interest or engagement in activities will improve Outcome: Progressing Goal: Sleeping patterns will improve Outcome: Progressing   Problem: Coping: Goal: Ability to verbalize frustrations and anger appropriately will improve Outcome: Progressing Goal: Ability to demonstrate self-control will improve Outcome: Progressing   Problem: Health Behavior/Discharge Planning: Goal: Identification of resources available to assist in meeting health care needs will improve Outcome: Progressing Goal: Compliance with treatment plan for underlying cause of condition will improve Outcome: Progressing   Problem: Physical Regulation: Goal: Ability to maintain clinical measurements within normal limits will improve Outcome: Progressing   Problem: Safety: Goal: Periods of time without injury will increase Outcome: Progressing   Problem: Education: Goal: Utilization of techniques to improve thought processes will improve Outcome: Progressing Goal: Knowledge of the prescribed therapeutic regimen will improve Outcome: Progressing   Problem: Activity: Goal: Interest or engagement in leisure activities will improve Outcome: Progressing Goal: Imbalance in normal sleep/wake cycle will improve Outcome: Progressing   Problem: Coping: Goal: Coping ability will improve Outcome: Progressing Goal: Will verbalize feelings Outcome: Progressing   Problem: Health Behavior/Discharge Planning: Goal: Ability to make decisions will improve Outcome: Progressing Goal: Compliance with therapeutic regimen will improve Outcome: Progressing    Problem: Role Relationship: Goal: Will demonstrate positive changes in social behaviors and relationships Outcome: Progressing   Problem: Safety: Goal: Ability to disclose and discuss suicidal ideas will improve Outcome: Progressing Goal: Ability to identify and utilize support systems that promote safety will improve Outcome: Progressing   Problem: Self-Concept: Goal: Will verbalize positive feelings about self Outcome: Progressing Goal: Level of anxiety will decrease Outcome: Progressing   Problem: Education: Goal: Knowledge of disease or condition will improve Outcome: Progressing Goal: Understanding of discharge needs will improve Outcome: Progressing   Problem: Health Behavior/Discharge Planning: Goal: Ability to identify changes in lifestyle to reduce recurrence of condition will improve Outcome: Progressing Goal: Identification of resources available to assist in meeting health care needs will improve Outcome: Progressing   Problem: Physical Regulation: Goal: Complications related to the disease process, condition or treatment will be avoided or minimized Outcome: Progressing   Problem: Safety: Goal: Ability to remain free from injury will improve Outcome: Progressing

## 2023-02-22 NOTE — Progress Notes (Signed)
D: Patient alert and oriented, able to make needs known. Denies SI/HI, AVH at present. Denies pain at present. Rates depression 6/10, hopelessness 0/10, and anxiety 5/10. Patient reports energy level as normal. He reports he slept "okay" last night. Patient does not request any PRN medication at this time.   A: Scheduled medications administered to patient per MD order. Support and encouragement provided. Routine safety checks conducted every fifteen minutes. Patient informed to notify staff with problems or concerns. Frequent verbal contact made.   R: No adverse drug reactions noted. Patient contracts for safety at this time. Patient is compliant with medications and treatment plan. Patient receptive, calm and cooperative. Patient interacts with others appropriately on unit at present. Patient remains safe at present.
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
## 2023-02-22 NOTE — Progress Notes (Addendum)
Pt c/o some muscle stiffness specifically in the neck and jaw area. Brooke NP made aware.  Also continues to complain of feeling like his skin is crawling.

## 2023-02-22 NOTE — Progress Notes (Signed)
   02/22/23 0542  15 Minute Checks  Location Bedroom  Visual Appearance Calm  Behavior Composed  Sleep (Behavioral Health Patients Only)  Calculate sleep? (Click Yes once per 24 hr at 0600 safety check) Yes  Documented sleep last 24 hours 6.75

## 2023-02-22 NOTE — Group Note (Signed)
Date:  02/22/2023 Time:  10:44 PM  Group Topic/Focus:  Wrap-Up Group:   The focus of this group is to help patients review their daily goal of treatment and discuss progress on daily workbooks.    Participation Level:  Active  Participation Quality:  Appropriate  Affect:  Appropriate  Cognitive:  Appropriate  Insight: Appropriate  Engagement in Group:  Engaged  Modes of Intervention:  Education and Exploration  Additional Comments:  Patient attended and participated in group tonight. He reports today he learn that he is not alone in this journey.               Salley Scarlet [facility] 02/22/2023, 10:44 PM

## 2023-02-23 NOTE — Group Note (Signed)
LCSW Group Therapy Note  Group Date: 02/23/2023 Start Time: [id number] End Time: 1100   Type of Therapy and Topic:  Group Therapy - How To Cope with Nervousness about Discharge   Participation Level:  Did Not Attend   Description of Group This process group involved identification of patients' feelings about discharge. Some of them are scheduled to be discharged soon, while others are new admissions, but each of them was asked to share thoughts and feelings surrounding discharge from the hospital. One common theme was that they are excited at the prospect of going home, while another was that many of them are apprehensive about sharing why they were hospitalized. Patients were given the opportunity to discuss these feelings with their peers in preparation for discharge.  Therapeutic Goals  Patient will identify their overall feelings about pending discharge. Patient will think about how they might proactively address issues that they believe will once again arise once they get home (i.e. with parents). Patients will participate in discussion about having hope for change.   Summary of Patient Progress:  did not attend   Therapeutic Modalities Cognitive Behavioral Therapy   Ardeth Perfect 02/23/2023  3:16 PM
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
## 2023-02-23 NOTE — Progress Notes (Addendum)
D: Patient alert and oriented. Affect/mood reported as improving. Denies SI, HI, AVH, and pain. Patient goal, "to not feel like I'm going through withdrawals." Reports he still feels like something is crawling on his skin.Rates depression 5/10, hopelessness 4/10, anxiety 6/10.  A: Scheduled medication administered to patient, per MD orders. Support and encouragement provided. Routine safety checks conducted every 15 minutes. Patient informed to notify staff with problems or concerns.   R: No adverse drug reactions noted. Patient contracts for safety at this time. Patient compliant with medications and treatment plan. Patient receptive, calm and cooperative. Patient interacts well with others on unit. Patient remains safe at this time.

## 2023-02-23 NOTE — BHH Group Notes (Signed)
Adult Psychoeducational Group  Date:  02/16/2023 Time: 1300-1400  Group Topic/Focus: Continuation of the group from Saturday. Looking at the lists that were created and talking about what needs to be done with the homework of 30 positives about themselves.                                     Talking about taking their power back and helping themselves to develop a positive self esteem.      Participation Quality:  Appropriate  Affect:  Appropriate  Cognitive:  Oriented  Insight: Improving  Engagement in Group:  Engaged  Modes of Intervention:  Activity, Discussion, Education, and Support  Additional Comments:  Rates energy at a 5/10. Participated in the group.  Paulino Rily

## 2023-02-23 NOTE — Group Note (Unsigned)
Date:  02/23/2023 Time:  10:55 PM  Group Topic/Focus:  Wrap-Up Group:   The focus of this group is to help patients review their daily goal of treatment and discuss progress on daily workbooks.     Participation Level:  {BHH PARTICIPATION WO:[id number]  Participation Quality:  {BHH PARTICIPATION QUALITY:22265}  Affect:  {BHH AFFECT:22266}  Cognitive:  {BHH COGNITIVE:22267}  Insight: {BHH Insight2:20797}  Engagement in Group:  {BHH ENGAGEMENT IN BP:[id number]  Modes of Intervention:  {BHH MODES OF INTERVENTION:22269}  Additional Comments:  ***  Debe Coder 02/23/2023, 10:55 PM

## 2023-02-23 NOTE — Progress Notes (Signed)
D) Pt received calm, visible, participating in milieu, and in no acute distress. Pt A & O x4. Pt denies SI, HI, A/ V H, depression, anxiety and pain at this time. A) Pt encouraged to drink fluids. Pt encouraged to come to staff with needs. Pt encouraged to attend and participate in groups. Pt encouraged to set reachable goals.  R) Pt remained safe on unit, in no acute distress, will continue to assess.     02/23/23 2100  Psych Admission Type (Psych Patients Only)  Admission Status Voluntary  Psychosocial Assessment  Patient Complaints Anxiety  Eye Contact Fair  Facial Expression Anxious  Affect Anxious  Speech Logical/coherent  Interaction Assertive  Motor Activity Fidgety  Appearance/Hygiene Unremarkable  Behavior Characteristics Cooperative  Mood Anxious  Thought Process  Coherency WDL  Content WDL  Delusions None reported or observed  Perception WDL  Hallucination None reported or observed  Judgment Poor  Confusion None  Danger to Self  Current suicidal ideation? Denies  Agreement Not to Harm Self Yes  Description of Agreement verbal  Danger to Others  Danger to Others None reported or observed

## 2023-02-23 NOTE — Group Note (Signed)
Date:  02/23/2023 Time:  10:39 PM  Group Topic/Focus:  Wrap-Up Group:   The focus of this group is to help patients review their daily goal of treatment and discuss progress on daily workbooks.    Participation Level:  Active  Participation Quality:  Appropriate  Affect:  Appropriate  Cognitive:  Appropriate  Insight: Appropriate  Engagement in Group:  Engaged  Modes of Intervention:  Education and Exploration  Additional Comments:  Patient attended and participated in group tonight.  Salley Scarlet [facility] 02/23/2023, 10:39 PM

## 2023-02-23 NOTE — Progress Notes (Signed)
Patient reports he is still having crawling sensations and reports he thinks he still may be withdrawing. Did not report any muscle stiffness.

## 2023-02-23 NOTE — B[facility] Group Notes (Signed)
Adult Psychoeducational Group Note Date:  02/23/2023 Time:  0900-1000 Group Topic/Focus: PROGRESSIVE RELAXATION. A group where deep breathing is taught and tensing and relaxation muscle groups is used. Imagery is used as well.  Pts are asked to imagine 3 pillars that hold them up when they are not able to hold themselves up and to share that with the group.   Participation Level:  Active  Participation Quality:  Appropriate  Affect:  Appropriate  Cognitive:  Approprate  Insight: Improving  Engagement in Group:  Engaged  Modes of Intervention:  deep breathing, Imagery. Discussion  Additional Comments:  Rates energy at a 5,10. States his Mom, [place] and [employer] hold him up.   : Joe Lawrence

## 2023-02-23 NOTE — Progress Notes (Signed)
   02/23/23 0000  Psych Admission Type (Psych Patients Only)  Admission Status Voluntary  Psychosocial Assessment  Patient Complaints Anxiety  Eye Contact Fair  Facial Expression Anxious  Affect Anxious  Speech Logical/coherent  Interaction Assertive  Motor Activity Fidgety  Appearance/Hygiene Unremarkable  Behavior Characteristics Cooperative;Anxious  Mood Anxious;Depressed  Thought Process  Coherency WDL  Content WDL  Delusions None reported or observed  Perception WDL  Hallucination None reported or observed  Judgment Poor  Confusion None  Danger to Self  Current suicidal ideation? Denies  Self-Injurious Behavior No self-injurious ideation or behavior indicators observed or expressed   Agreement Not to Harm Self Yes  Description of Agreement verbal  Danger to Others  Danger to Others None reported or observed

## 2023-02-24 ENCOUNTER — Ambulatory Visit (HOSPITAL_COMMUNITY): Payer: Medicare Other | Admitting: Licensed Clinical Social Worker

## 2023-02-24 MED ORDER — QUETIAPINE FUMARATE 50 MG PO TABS
150.0000 mg | ORAL_TABLET | Freq: Every day | ORAL | Status: DC
  Administered 2023-02-24: 150 mg via ORAL
  Filled 2023-02-24 (×2): qty 3
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
## 2023-02-24 NOTE — BHH Counselor (Signed)
CSW contacted [name] at [facility] [phone] and inquired about the Pt's possible admission.  [name] states that the nurse has not reviewed the Pt's information at this time and will contact the CSW once this is completed.  CSW informed [name] that the Pt would be discharging tomorrow if not accepted to [place] stated "if she reviews it today then I will contact you back".  CSW will update Pt and providers as information is received.

## 2023-02-24 NOTE — BHH Group Notes (Signed)
Spiritual care group on grief and loss facilitated by Chaplain Janne Napoleon, Bcc and Lysle Morales, counseling intern.  Group Goal: Support / Education around grief and loss  Members engage in facilitated group support and psycho-social education.  Group Description:  Following introductions and group rules, group members engaged in facilitated group dialogue and support around topic of loss, with particular support around experiences of loss in their lives. Group Identified types of loss (relationships / self / things) and identified patterns, circumstances, and changes that precipitate losses. Reflected on thoughts / feelings around loss, normalized grief responses, and recognized variety in grief experience. Group encouraged individual reflection on safe space and on the coping skills that they are already utilizing.  Group drew on Adlerian / Rogerian and narrative framework  Patient Progress: Joe Lawrence attended group and engaged and participated in group activities and conversation.  [address], [place] Pager, [phone]

## 2023-02-24 NOTE — Group Note (Signed)
Occupational Therapy Group Note  Group Topic: Sleep Hygiene  Group Date: 02/24/2023 Start Time: 1430 End Time: 1510 Facilitators: Brantley Stage, OT   Group Description: Group encouraged increased participation and engagement through topic focused on sleep hygiene. Patients reflected on the quality of sleep they typically receive and identified areas that need improvement. Group was given background information on sleep and sleep hygiene, including common sleep disorders. Group members also received information on how to improve one's sleep and introduced a sleep diary as a tool that can be utilized to track sleep quality over a length of time. Group session ended with patients identifying one or more strategies they could utilize or implement into their sleep routine in order to improve overall sleep quality.        Therapeutic Goal(s):  Identify one or more strategies to improve overall sleep hygiene  Identify one or more areas of sleep that are negatively impacted (sleep too much, too little, etc)     Participation Level: Engaged   Participation Quality: Independent   Behavior: Appropriate   Speech/Thought Process: Relevant   Affect/Mood: Appropriate   Insight: Improved   Judgement: Improved   Individualization: pt was active and engaged in their participation of group discussion/activity. New skills were identified  Modes of Intervention: Education  Patient Response to Interventions:  Attentive   Plan: Continue to engage patient in OT groups 2 - 3x/week.  02/24/2023  Brantley Stage, OT Cornell Barman, OT

## 2023-02-24 NOTE — Progress Notes (Signed)
Pt reports feeling anxious due to his pancreatitis and wants to go home to manage his diet better. Pt reports his pancreas pain as 8/10. Pt reports a okay appetite, and no other physical problems. Pt denies SI/HI/AVH and verbally contracts for safety. Provided support and encouragement. Pt safe on the unit. Q 15 minute safety checks continued.    02/24/23 2010  Psych Admission Type (Psych Patients Only)  Admission Status Voluntary  Psychosocial Assessment  Patient Complaints Anxiety  Eye Contact Fair  Facial Expression Anxious  Affect Anxious  Speech Logical/coherent  Interaction Assertive  Motor Activity Fidgety  Appearance/Hygiene Unremarkable  Behavior Characteristics Cooperative;Appropriate to situation  Mood Anxious  Thought Process  Coherency WDL  Content WDL  Delusions None reported or observed  Perception WDL  Hallucination None reported or observed  Judgment Impaired  Confusion None  Danger to Self  Current suicidal ideation? Denies  Self-Injurious Behavior No self-injurious ideation or behavior indicators observed or expressed   Agreement Not to Harm Self Yes  Description of Agreement verbal  Danger to Others  Danger to Others None reported or observed

## 2023-02-24 NOTE — BHH Group Notes (Signed)
Pt attended A/A

## 2023-02-24 NOTE — Group Note (Signed)
Recreation Therapy Group Note   Group Topic:Stress Management  Group Date: 02/24/2023 Start Time: 0930 End Time: 0950 Facilitators: Taelyn Broecker-McCall, LRT,CTRS Location: [address] Dayroom   Goal Area(s) Addresses:  Patient will identify positive stress management techniques. Patient will identify benefits of using stress management post d/c.  Group Description:  Meditation.  LRT and patients discussed meditation and what it in tales.  LRT then explained to patients the group setting.  LRT played a meditation that focused on starting the day motivated and confident.  Patients were to listen and follow along as meditation played to fully engage in the process.   Affect/Mood: Appropriate   Participation Level: Engaged   Participation Quality: Independent   Behavior: Appropriate   Speech/Thought Process: Focused   Insight: Good   Judgement: Good   Modes of Intervention: Meditation   Patient Response to Interventions:  Engaged   Education Outcome:  Acknowledges education and In group clarification offered    Clinical Observations/Individualized Feedback: Pt attended and participated in group session.     Plan: Continue to engage patient in RT group sessions 2-3x/week.   Jaline Pincock-McCall, LRT,CTRS 02/24/2023 1:13 PM

## 2023-02-24 NOTE — Progress Notes (Addendum)
D: Pt denied SI/HI/AVH this morning. Patient continues to complain of sweating and shaking due to withdrawals, stating "I feel worse than I did yesterday". Patient does not appear to have a tremor at rest. Pt has been pleasant, calm, and cooperative throughout the shift.   A: RN provided support and encouragement to patient. Pt given scheduled medications as prescribed. Q15 min checks verified for safety.    R: Patient verbally contracts for safety. Patient compliant with medications and treatment plan. Patient is interacting well on the unit. Pt is safe on the unit.   02/24/23 1000  Psych Admission Type (Psych Patients Only)  Admission Status Voluntary  Psychosocial Assessment  Patient Complaints Anxiety  Eye Contact Fair  Facial Expression Anxious  Affect Anxious  Speech Logical/coherent  Interaction Assertive  Motor Activity Fidgety  Appearance/Hygiene Unremarkable  Behavior Characteristics Cooperative;Appropriate to situation  Mood Anxious  Thought Process  Coherency WDL  Content WDL  Delusions None reported or observed  Perception WDL  Hallucination None reported or observed  Judgment Impaired  Confusion None  Danger to Self  Current suicidal ideation? Denies  Self-Injurious Behavior No self-injurious ideation or behavior indicators observed or expressed   Agreement Not to Harm Self Yes  Description of Agreement Pt verbally contracts for safety  Danger to Others  Danger to Others None reported or observed

## 2023-02-24 NOTE — Progress Notes (Signed)
Patient returned from dinner stating "I am not eating anything else this admission, my pancreas is hurting so bad it hurts to even drink water I need to go to the ER.". Patient demonstrated no physical signs of physical pain or acute distress r/t pancreas pain. RN notified NP of patient complaint. Patient seen interacting on unit following incident without any distress noted.

## 2023-02-24 NOTE — BHH Group Notes (Signed)
Adult Psychoeducational Group Note  Date:  02/24/2023 Time:  4:47 PM  Group Topic/Focus:  Social wellness  Participation Level:  Active  Participation Quality:  Appropriate  Affect:  Appropriate  Cognitive:  Appropriate  Insight: Appropriate  Engagement in Group:  Engaged  Modes of Intervention:  Role-play  Additional Comments:  Pt enjoyed playing a game of charades with his peers.Pt has no feelings of wanting to hurt himself or others.  Shelita Steptoe, Georgiann Mccoy 02/24/2023, 4:47 PM

## 2023-02-25 ENCOUNTER — Encounter: Payer: Self-pay | Admitting: Internal Medicine

## 2023-02-25 MED ORDER — PANCRELIPASE (LIP-PROT-AMYL) 12000-38000 UNITS PO CPEP: 12000.0000 [IU] | ORAL_CAPSULE | Freq: Three times a day (TID) | ORAL | 0 refills | Status: DC

## 2023-02-25 MED ORDER — FLUTICASONE PROPIONATE HFA 220 MCG/ACT IN AERO: 2.0000 | INHALATION_SPRAY | Freq: Two times a day (BID) | RESPIRATORY_TRACT | 0 refills | Status: DC | PRN

## 2023-02-25 MED ORDER — QUETIAPINE FUMARATE 150 MG PO TABS: 150.0000 mg | ORAL_TABLET | Freq: Every day | ORAL | 0 refills | Status: DC

## 2023-02-25 MED ORDER — QUETIAPINE FUMARATE 50 MG PO TABS: 50.0000 mg | ORAL_TABLET | Freq: Two times a day (BID) | ORAL | 0 refills | Status: AC

## 2023-02-25 MED ORDER — GABAPENTIN 300 MG PO CAPS: 600.0000 mg | ORAL_CAPSULE | Freq: Four times a day (QID) | ORAL | 0 refills | Status: DC

## 2023-02-25 MED ORDER — OLOPATADINE HCL 0.1 % OP SOLN: 1.0000 [drp] | Freq: Every day | OPHTHALMIC | 0 refills | Status: AC | PRN

## 2023-02-25 MED ORDER — PANTOPRAZOLE SODIUM 40 MG PO TBEC: 40.0000 mg | DELAYED_RELEASE_TABLET | Freq: Two times a day (BID) | ORAL | 0 refills | Status: DC

## 2023-02-25 MED ORDER — HYDROXYZINE HCL 50 MG PO TABS: 50.0000 mg | ORAL_TABLET | Freq: Four times a day (QID) | ORAL | 0 refills | Status: DC | PRN

## 2023-02-25 MED ORDER — LISINOPRIL 10 MG PO TABS: 10.0000 mg | ORAL_TABLET | Freq: Every day | ORAL | 0 refills | Status: DC

## 2023-02-25 MED ORDER — ESCITALOPRAM OXALATE 20 MG PO TABS: 20.0000 mg | ORAL_TABLET | Freq: Every day | ORAL | 0 refills | Status: AC

## 2023-02-25 NOTE — Progress Notes (Signed)
D: Patient verbalizes readiness for discharge, denies suicidal and homicidal ideations, denies auditory and visual hallucinations.  No complaints of pain. Suicide Safety Plan completed and copy placed in the chart.  A:  Both parent and patient receptive to discharge instructions. Questions encouraged, both verbalize understanding.  R:  Escorted to the lobby by this MHT.
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
## 2023-02-25 NOTE — Progress Notes (Signed)
  [facility] Adult Case Management Discharge Plan :  Will you be returning to the same living situation after discharge:  Yes,  Home with mother  At discharge, do you have transportation home?: Yes,  Mother  Do you have the ability to pay for your medications: Yes,  Medicare   Release of information consent forms completed and in the chart;  Patient's signature needed at discharge.  Patient to Follow up at:  [facility] Follow up.   Specialty: Behavioral Health Why: Please go to this provider for an assessment, to obtain therapy and medication management services, on Monday through Friday, arrive by 7:20 am. Assessments will be provided on a first come, first served basis. Contact information: [place]. Go on 02/26/2023.   Specialty: Behavioral Health Why: You have an appointment on 02/26/23 at 2:30 pm. This appointment will be held in person at [address], [place]. This program meets in-person M,W,F from 9 am to 12 pm and runs for 8 - 12 weeks. Clients can also receive individual and family therapy and MAT. There is weekly drug testing. The program is abstinence-based and AA, NA, Smart Recovery, etc. attendance is encouraged. For questions, please call [phone]. Contact information: [place] [phone]                Next level of care provider has access to [place] and Suicide Prevention discussed: Yes,  With patient and mother      Has patient been referred to the Quitline?: N/A patient is not a smoker  Patient has been referred for addiction treatment: Yes, [facility] at [place], [place] 02/25/2023, 9:33 AM

## 2023-02-25 NOTE — Group Note (Incomplete)
LCSW Group Therapy Note   Group Date: 02/25/2023 Start Time: 1100 End Time: 1200   Type of Therapy and Topic:  Group Therapy - Healthy vs Unhealthy Coping Skills  Participation Level:  {B[facility] PARTICIPATION [id number]   Description of Group The focus of this group was to determine what unhealthy coping techniques typically are used by group members and what healthy coping techniques would be helpful in coping with various problems. Patients were guided in becoming aware of the differences between healthy and unhealthy coping techniques. Patients were asked to identify 2-3 healthy coping skills they would like to learn to use more effectively.  Therapeutic Goals Patients learned that coping is what human beings do all day long to deal with various situations in their lives Patients defined and discussed healthy vs unhealthy coping techniques Patients identified their preferred coping techniques and identified whether these were healthy or unhealthy Patients determined 2-3 healthy coping skills they would like to become more familiar with and use more often. Patients provided support and ideas to each other   Summary of Patient Progress:  During group, patient expressed their definition and understanding of what it means to cope. Pt actively engaged in identifying unhealthy coping mechanisms they have utilized in the past. Pt actively engaged in processing means of coping and what outcomes occur from such methods. Pt further engaged in discussion, identifying healthy coping mechanisms they have used in the past. Pt engaged in processing the use of healthier mechanisms and how these produce different gains to unhealthy mechanisms. Pt actively identified other coping mechanisms they would be willing to try in the future. Pt proved receptive of alternate group members input and feedback from [place].   Therapeutic Modalities Cognitive Behavioral Therapy Motivational Interviewing  Darleen Crocker,  [place] 02/25/2023  10:30 AM

## 2023-02-25 NOTE — BHH Suicide Risk Assessment (Signed)
Suicide Risk Assessment  Discharge Assessment    [facility] Discharge Suicide Risk Assessment   Principal Problem: Severe major depression, single episode, without psychotic features [facility]) Discharge Diagnoses: Principal Problem:   Severe major depression, single episode, without psychotic features ([place]) Active Problems:   Opioid use disorder, severe, dependence ([place])   Severe benzodiazepine use disorder ([place])   Generalized anxiety disorder  Reason for admission: This is one of several psychiatric admissions/evaluations in this [facility] for this 52 year old Caucasian male with hx of major depressive disorder, chronic, generalized anxiety disorder, alcohol use disorder, benzodiazepine use disorder, opioid use disorder & cannabis use disorder. Admitted to the [facility] from the [facility] with complain of suicide attempt by hanging triggered by sever substance withdrawal symptoms (benzodiazepine & Kratom). Patient is currently receiving mental health care on an outpatient basis with Dr. Earlie Lou. Chart review indicated that patient reported at the ED that he was feeling overwhelmed from substance withdrawal, got frustrated & decided to hang himself 2 nights ago. After medical evaluation/stabilization/clearance, Jamieson was transferred to the [facility] for further psychiatric evaluation/treatments.                                    HOSPITAL COURSE During the patient's hospitalization, patient had extensive initial psychiatric evaluation, and follow-up psychiatric evaluations every day. Psychiatric diagnoses provided upon initial assessment are as listed above. Patient's psychiatric medications were adjusted on admission as follows:  -Continue Lexapro 20 mg po daily for depression. -Continue the [facility] detox protocols for benzodiazepine withdrawal management.  -Continue gabapentin 600 mg po qid for substance withdrawal syndrome.  -Continue Vistaril 50 mg po qid prn for anxiety.  -Continue Trazodone 50 mg po q hs  prn for insomnia.  -Continue Creon 12.00 mg po tid for pancreatic enzyme insufficiency.  -Continue Flovent inhaler 2 puffs bid prn for SOB. -Continue Protonix 40 mg po Q am for acid reflux.  During the hospitalization, other adjustments were made to the patient's psychiatric medication regimen. Discontinued Trazodone on 2/17 as pt complained that it caused grogginess. An additional Ativan taper for Benzodiazepine withdrawal was given after completion of initial taper due to complaints of continuous withdrawal symptoms as follows: '1mg'$  x TID x1 day, BID x 1 day, 0.5 mg BID x 1 day, then stopped. (Ended 2/24 @ 0800). Pt was given Clonidine 0.1 mg nightly x 2 days (hold for SBP<120 or DBP<90), and also had Clonidine 0.1 mg PRN for elevated Bps. Medications at discharge are as follows:  -Continue Lexapro 20 mg po daily for depression. -Continue gabapentin 600 mg po qid for substance withdrawal syndrome.  -Continue Vistaril 50 mg po qid prn for anxiety.  -Continue Seroquel to 50 mg BID for anxiety/mood  -Continue Seroquel to 150 mg nightly for sleep/mood -Continue Creon 12.00 mg po tid for pancreatic enzyme insufficiency.  -Continue Flovent inhaler 2 puffs bid prn for SOB. -Continue Protonix 40 mg po Q am for acid reflux.  Patient's care was discussed during the interdisciplinary team meeting every day during the hospitalization. The patient denies having side effects to prescribed psychiatric medication.  Gradually, patient started adjusting to milieu. The patient was evaluated each day by a clinical provider to ascertain response to treatment. Improvement was noted by the patient's report of decreasing symptoms, improved sleep and appetite, affect, medication tolerance, behavior, and participation in unit programming.  Patient was asked each day to complete a self inventory  noting mood, mental status, pain, new symptoms, anxiety and concerns.    Symptoms were reported as significantly decreased or  resolved completely by discharge. On day of discharge, the patient reports that their mood is stable. The patient denied having suicidal thoughts for more than 48 hours prior to discharge.  Patient denies having homicidal thoughts.  Patient denies having auditory hallucinations.  Patient denies any visual hallucinations or other symptoms of psychosis. The patient was motivated to continue taking medication with a goal of continued improvement in mental health.   The patient reports their target psychiatric symptoms of depression, anxiety & insomnia responded well to the psychiatric medications, and the patient reports overall benefit from this psychiatric hospitalization. Supportive psychotherapy was provided to the patient. The patient also participated in regular group therapy while hospitalized. Coping skills, problem solving as well as relaxation therapies were also part of the unit programming.  Labs were reviewed with the patient, and abnormal results were discussed with the patient.  The patient is able to verbalize their individual safety plan to this provider.  # It is recommended to the patient to continue psychiatric medications as prescribed, after discharge from the hospital.    # It is recommended to the patient to follow up with your outpatient psychiatric provider and PCP.  # It was discussed with the patient, the impact of alcohol, drugs, tobacco have been there overall psychiatric and medical wellbeing, and total abstinence from substance use was recommended the patient.ed.  # Prescriptions provided or sent directly to preferred pharmacy at discharge. Patient agreeable to plan. Given opportunity to ask questions. Appears to feel comfortable with discharge.    # In the event of worsening symptoms, the patient is instructed to call the crisis hotline, 911 and or go to the nearest ED for appropriate evaluation and treatment of symptoms. To follow-up with primary care provider for other  medical issues, concerns and or health care needs  # Patient was discharged home to his mother's home with a plan to follow up as noted below.   Total Time spent with patient: 45 minutes  Musculoskeletal: Strength & Muscle Tone: within normal limits Gait & Station: normal Patient leans: N/A  Psychiatric Specialty Exam  Presentation  General Appearance:  Appropriate for Environment; Neat  Eye Contact: Good  Speech: Clear and Coherent  Speech Volume: Normal  Handedness: Right   Mood and Affect  Mood: Euthymic  Duration of Depression Symptoms: No data recorded Affect: Congruent   Thought Process  Thought Processes: Coherent  Descriptions of Associations:Intact  Orientation:Full (Time, Place and Person)  Thought Content:Logical  History of Schizophrenia/Schizoaffective disorder:No data recorded Duration of Psychotic Symptoms:No data recorded Hallucinations:Hallucinations: None  Ideas of Reference:None  Suicidal Thoughts:Suicidal Thoughts: No  Homicidal Thoughts:Homicidal Thoughts: No   Sensorium  Memory: Immediate Good  Judgment: Good  Insight: Good   Executive Functions  Concentration: Good  Attention Span: Good  Recall: Good  Fund of Knowledge: Good  Language: Good   Psychomotor Activity  Psychomotor Activity: Psychomotor Activity: Normal  Assets  Assets: Communication Skills  Sleep  Sleep: Sleep: Good  Physical Exam: Physical Exam Constitutional:      Appearance: Normal appearance.  HENT:     Head: Normocephalic.     Nose: Nose normal.  Neurological:     Mental Status: He is alert and oriented to person, place, and time.  Psychiatric:        Mood and Affect: Mood normal.        Thought  Content: Thought content normal.    Review of Systems  Constitutional:  Negative for fever.  HENT: Negative.    Eyes: Negative.   Respiratory: Negative.    Cardiovascular: Negative.   Gastrointestinal: Negative.    Genitourinary: Negative.   Musculoskeletal: Negative.   Skin: Negative.   Neurological: Negative.   Psychiatric/Behavioral:  Positive for depression (Currently denies SI, denies HI, denies AVH, verbally contracts for safety outside of this [facility]) and substance abuse (Pt declines inpatient rehab at this time. Referral placed for [facility], but he states he will not go there, and prefers intensive outpatient substance abuse treatment. Educated on substance use cessation, verbalizes understanding). Negative for hallucinations, memory loss and suicidal ideas. The patient is nervous/anxious (resolving on current medications) and has insomnia (resolving on current medications).    Blood pressure 123/84, pulse 95, temperature 98.2 F (36.8 C), temperature source Oral, resp. rate 16, height '5\' 8"'$  (1.727 m), weight 65.8 kg, SpO2 100 %. Body mass index is 22.05 kg/m.  Mental Status Per Nursing Assessment::   On Admission:  Intention to act on suicide plan-Patient currently denies SI/HI/AVH, verbally contracts fo r safety outside of [facility].  Demographic Factors:  Male and Low socioeconomic status  Loss Factors: Financial problems/change in socioeconomic status  Historical Factors: Impulsivity  Risk Reduction Factors:   Sense of responsibility to family, Living with another person, especially a relative, and Positive social support  Continued Clinical Symptoms:  Alcohol/Substance Abuse/Dependencies-Pt reports that depressive symptoms have significantly reduced since hospitalization. He denies SI, denies HI, denies AVH, verbally contracts for safety outside of this hospital.   Cognitive Features That Contribute To Risk:  None    Suicide Risk:  Mild:  There are no identifiable suicide plans, no associated intent, mild dysphoria and related symptoms, good self-control (both objective and subjective assessment), few other risk factors, and identifiable protective factors, including available and  accessible social support.    [place] Follow up.   Specialty: Behavioral Health Why: Please go to this provider for an assessment, to obtain therapy and medication management services, on Monday through Friday, arrive by 7:20 am. Assessments will be provided on a first come, first served basis. Contact information: [address] [place]. Go on 02/26/2023.   Specialty: Behavioral Health Why: You have an appointment on 02/26/23 at 2:30 pm. This appointment will be held in person at [address], [place]. This program meets in-person M,W,F from 9 am to 12 pm and runs for 8 - 12 weeks. Clients can also receive individual and family therapy and MAT. There is weekly drug testing. The program is abstinence-based and AA, NA, [facility], etc. attendance is encouraged. For questions, please call [phone]. Contact information: [place], NP 02/25/2023, 10:22 AM

## 2023-02-26 ENCOUNTER — Ambulatory Visit (INDEPENDENT_AMBULATORY_CARE_PROVIDER_SITE_OTHER): Payer: Medicare Other | Admitting: Licensed Clinical Social Worker

## 2023-02-26 DIAGNOSIS — F1121 Opioid dependence, in remission: Secondary | ICD-10-CM

## 2023-02-26 DIAGNOSIS — F1920 Other psychoactive substance dependence, uncomplicated: Secondary | ICD-10-CM

## 2023-02-26 DIAGNOSIS — F1220 Cannabis dependence, uncomplicated: Secondary | ICD-10-CM

## 2023-02-26 DIAGNOSIS — F1020 Alcohol dependence, uncomplicated: Secondary | ICD-10-CM

## 2023-02-26 DIAGNOSIS — F909 Attention-deficit hyperactivity disorder, unspecified type: Secondary | ICD-10-CM

## 2023-02-26 NOTE — Progress Notes (Unsigned)
Swimmy feeling like seizure Had 8.5 years of sobriety last four years stopped going Was searching for something for anxiety and saw Kratom on-line Was dry drunk for four years until now Something happened at a meeting and used it as an excuse Fentanyl jumped over counter to rob 10 years ago [place] dreams never addressed with a Counselor Most stressful year-and-a-half "tore me apart" and "changed me mentally" Don't want Klonepin anymore; it's hard "mentally and spiritually broken"  In and out of detox 30 years; probably 25 detoxes [facility] about ten times 14 day programs [facility] [place] 28 days   Craving opioids still Suboxone [place] only tried it once On Vistaril now  Alcohol age 3 Pot before drinking age 47 Brother and sister peer pressure mama's boy Mom's mom alcoholic and brother has addiction issues with alcohol and Klonepin  Sister in [place] smokes pot Age 46 and on problems with alcohol Moved to [place] age 38 and introduced to benzos by doctor and immediately addicted [facility] age 47 girlfriend tried drinking self to death introduced to Hydrocodone and Oxy from guy in treatment age 34 back to 76  AA treatment [place] treatment get a year or year-and-a-half and never get 2 years 60 admitted to [facility] pancreatitis induced coma 30 days Got out of there addicted to Fentanyl; cut off from [employer] for 2 years and became a problem Pain management cold turkeyed 40  [age]; came out of prison with three years; meetings about 1.5 years and 4 years dry drunk Was smoking flower Day to go in hospital tried liquid courage to kill self and started throwing up Went back to [facility] almost two years ago 1 mg of Klonepin QID; last time was 2 mg 52 Kratom, THC gummies  Autism did not speak until age 76 disability 33 years ago Aphasia can't come up with the word ADHD Ritalin as a child  Addicted to disc golf threw for 5 hours Have excessive  handwashing Lexapro helps

## 2023-02-27 ENCOUNTER — Telehealth (HOSPITAL_COMMUNITY): Payer: Self-pay | Admitting: Licensed Clinical Social Worker

## 2023-02-27 NOTE — Telephone Encounter (Signed)
The therapist calls Joe Lawrence confirming his identity via two identifiers. He says that he is feeling worse today and that he did not sleep well. The therapist provides him with the contact information for [facility] in [facility] provided to him by the P.A. for CD IOP.   Joe Lawrence is encouraged to hang up and call them immediately explaining that he was recently discharged, has Medicare, and that Suboxone was recommended. He is to then call this therapist back at [phone].  The therapist again has to explain that Suboxone does not require going in daily to be dosed as during the brief time that he was at the [place] on Suboxone, he was apparently required to come in daily to get it.  Adam Phenix, [place], LCSW, [facility], [place] 02/27/2023

## 2023-02-27 NOTE — Telephone Encounter (Signed)
The therapist returns Nekt[place]' call confirming his identity via two identifiers. Morrie now has a appointment at [facility] on Monday to start Suboxone. He says that he is worst today and has been "shaking" and having that "skin crawling" feeling but took some Vistaril which helps some.   The therapist and [place] discuss when it might be best to start CD IOP with the plan for him to start the Suboxone on Monday, call this therapist to check-in, and then start on 03/05/23. He says that he has the number of a guy he can call in [place] and is going to attend a speaker meeting tonight.  He says that his primary reason to get sober is that he does not want to die and wants his mental health back as when he was attending the program.    Adam Phenix, [place], [place], [facility], [place] 02/27/2023

## 2023-03-04 ENCOUNTER — Telehealth: Payer: Self-pay | Admitting: Family Medicine

## 2023-03-04 MED ORDER — LISINOPRIL 10 MG PO TABS: 10.0000 mg | ORAL_TABLET | Freq: Every day | ORAL | 3 refills | Status: DC

## 2023-03-04 MED ORDER — PANCRELIPASE (LIP-PROT-AMYL) 12000-38000 UNITS PO CPEP: 24000.0000 [IU] | ORAL_CAPSULE | Freq: Three times a day (TID) | ORAL | 5 refills | Status: DC

## 2023-03-04 NOTE — Telephone Encounter (Signed)
Pt come in and is requesting a refill on his Creon says it needs to be 24,000 units not 12000 that was prescribed in Feb Pt also says it needs to be generic brand   Pt also needs his lisinopril     Pt has a appt  04/21/2023    Pt needs it to go to the [place], [place]

## 2023-03-05 ENCOUNTER — Telehealth (HOSPITAL_COMMUNITY): Payer: Self-pay | Admitting: Licensed Clinical Social Worker

## 2023-03-05 NOTE — Telephone Encounter (Signed)
The therapist attempts to reach [place] as he did not hear back from him as planned after his scheduled appointment with the Suboxone provider in [facility]. The therapist leaves a HIPAA-compliant voicemail with his direct callback number.   Adam Phenix, [place], LCSW, [facility] [place], [place] 03/05/2023

## 2023-03-06 ENCOUNTER — Telehealth (HOSPITAL_COMMUNITY): Payer: Self-pay | Admitting: Licensed Clinical Social Worker

## 2023-03-06 DIAGNOSIS — F411 Generalized anxiety disorder: Secondary | ICD-10-CM | POA: Diagnosis not present

## 2023-03-06 LAB — HM HIV SCREENING LAB: HM HIV Screening: NEGATIVE

## 2023-03-06 LAB — LAB REPORT - SCANNED: EGFR: 80

## 2023-03-06 LAB — HM HEPATITIS C SCREENING LAB: HM Hepatitis Screen: NEGATIVE

## 2023-03-06 NOTE — Telephone Encounter (Signed)
The therapist receives a message from Joe Lawrence saying that he started the Suboxone and is interested in starting CD IOP on 03/10/23. The therapist attempts to reach him leaving a HIPAA-compliant voicemail.  Adam Phenix, [place], LCSW, [facility], [place] 03/06/2023

## 2023-03-07 ENCOUNTER — Encounter (HOSPITAL_COMMUNITY): Payer: Self-pay

## 2023-03-07 ENCOUNTER — Telehealth (HOSPITAL_COMMUNITY): Payer: Self-pay | Admitting: Licensed Clinical Social Worker

## 2023-03-07 NOTE — Telephone Encounter (Signed)
The therapist returns Winfer[place]' call. He says that he is now on Suboxone and that it took away the "skin crawling" immediately and he is feeling better and no longer craving.  He will start CD IOP on 03/10/23.  Adam Phenix, [place], LCSW, [facility], [place] 03/07/2023

## 2023-03-10 ENCOUNTER — Telehealth (HOSPITAL_COMMUNITY): Payer: Self-pay | Admitting: Licensed Clinical Social Worker

## 2023-03-10 ENCOUNTER — Ambulatory Visit (HOSPITAL_COMMUNITY): Payer: Medicare Other

## 2023-03-10 ENCOUNTER — Encounter (HOSPITAL_COMMUNITY): Payer: Self-pay

## 2023-03-10 NOTE — Telephone Encounter (Signed)
The therapist attempts to reach [facility] in relation to his no showing for CD IOP today leaving a HIPAA-compliant voicemail with his direct callback number.  Adam Phenix, [place], LCSW, [facility], [place] 03/10/2023

## 2023-03-10 NOTE — Telephone Encounter (Signed)
The therapist receives a voicemail from [place] saying that he did not make it to group as he has had problems with his pancreatitis for the past week and if it does not improve today that he is going to have his mom take him to the hospital. Thus, he does not know when he will be able to start group but will call when able to do so.  Adam Phenix, [place], LCSW, [facility], [place] 03/10/2023

## 2023-03-12 ENCOUNTER — Ambulatory Visit (HOSPITAL_COMMUNITY): Payer: Medicare Other

## 2023-03-12 ENCOUNTER — Ambulatory Visit (INDEPENDENT_AMBULATORY_CARE_PROVIDER_SITE_OTHER): Payer: Medicare Other | Admitting: Family Medicine

## 2023-03-12 ENCOUNTER — Encounter: Payer: Self-pay | Admitting: Family Medicine

## 2023-03-12 VITALS — BP 100/66 | HR 106 | Temp 98.2°F | Resp 16 | Wt 139.2 lb

## 2023-03-12 DIAGNOSIS — F1910 Other psychoactive substance abuse, uncomplicated: Secondary | ICD-10-CM

## 2023-03-12 DIAGNOSIS — R300 Dysuria: Secondary | ICD-10-CM

## 2023-03-12 DIAGNOSIS — R1013 Epigastric pain: Secondary | ICD-10-CM

## 2023-03-12 DIAGNOSIS — R110 Nausea: Secondary | ICD-10-CM | POA: Diagnosis not present

## 2023-03-12 LAB — POCT URINALYSIS DIP (PROADVANTAGE DEVICE)
Blood, UA: NEGATIVE
Glucose, UA: NEGATIVE mg/dL
Ketones, POC UA: NEGATIVE mg/dL
Leukocytes, UA: NEGATIVE
Nitrite, UA: NEGATIVE
Protein Ur, POC: NEGATIVE mg/dL
Specific Gravity, Urine: 1.015
Urobilinogen, Ur: 0.2
pH, UA: 6 (ref 5.0–8.0)

## 2023-03-12 MED ORDER — ONDANSETRON HCL 4 MG PO TABS: 4.0000 mg | ORAL_TABLET | Freq: Three times a day (TID) | ORAL | 0 refills | Status: DC | PRN

## 2023-03-12 NOTE — Progress Notes (Signed)
   Subjective:    Patient ID: Joe Lawrence, male    DOB: [birth year], 52 y.o.   MRN: [id number]  HPI He is here for consult concerning recent difficulty with drug abuse, suicide attempt and continued difficulty with abdominal pain.  The medical record was reviewed.  It did show that he did have a suicide attempt.  He he is now involved in behavioral health and is also getting Suboxone therapy at [facility] family medicine in [place].  He mainly complains today of abdominal pain and thinks it is a recurrence of his pancreatitis.  He is taking Creon regularly but still having difficulty with nausea and abdominal pain.  He also complains of some slight dysuria as well.   Review of Systems     Objective:   Physical Exam Alert and in no distress.  His speech and demeanor were quite animated.  Tympanic membranes and canals are normal. Pharyngeal area is normal. Neck is supple without adenopathy or thyromegaly. Cardiac exam shows a regular sinus rhythm without murmurs or gallops. Lungs are clear to auscultation. Abdominal exam shows some slight midepigastric discomfort but no rebound.  Lab work from [facility] family medicine was reviewed.  Did show slightly low white blood count. The hospital medical record was reviewed.      Assessment & Plan:  Epigastric pain - Plan: CBC with Differential/Platelet, Comprehensive metabolic panel, Amylase, Lipase  Nausea - Plan: ondansetron (ZOFRAN) 4 MG tablet  Drug abuse (HCC) - Plan: CBC with Differential/Platelet, Comprehensive metabolic panel, Amylase, Lipase  Dysuria - Plan: Urinalysis Dipstick He will continue getting Suboxone as indicated.  He is to also continue with counseling through the mental health.  I will follow-up on his blood work.  Discussed the possibility of referring to GI if he shows no improvement.

## 2023-03-13 LAB — CBC WITH DIFFERENTIAL/PLATELET
Basophils Absolute: 0 10*3/uL (ref 0.0–0.2)
Basos: 0 %
EOS (ABSOLUTE): 0 10*3/uL (ref 0.0–0.4)
Eos: 0 %
Hematocrit: 37.9 % (ref 37.5–51.0)
Hemoglobin: 12.8 g/dL — ABNORMAL LOW (ref 13.0–17.7)
Immature Grans (Abs): 0 10*3/uL (ref 0.0–0.1)
Immature Granulocytes: 0 %
Lymphocytes Absolute: 1.1 10*3/uL (ref 0.7–3.1)
Lymphs: 32 %
MCH: 29.7 pg (ref 26.6–33.0)
MCHC: 33.8 g/dL (ref 31.5–35.7)
MCV: 88 fL (ref 79–97)
Monocytes Absolute: 0.5 10*3/uL (ref 0.1–0.9)
Monocytes: 13 %
Neutrophils Absolute: 1.9 10*3/uL (ref 1.4–7.0)
Neutrophils: 55 %
Platelets: 300 10*3/uL (ref 150–450)
RBC: 4.31 x10E6/uL (ref 4.14–5.80)
RDW: 12.6 % (ref 11.6–15.4)
WBC: 3.5 10*3/uL (ref 3.4–10.8)

## 2023-03-13 LAB — COMPREHENSIVE METABOLIC PANEL
ALT: 18 IU/L (ref 0–44)
AST: 15 IU/L (ref 0–40)
Albumin/Globulin Ratio: 2 (ref 1.2–2.2)
Albumin: 4.4 g/dL (ref 3.8–4.9)
Alkaline Phosphatase: 88 IU/L (ref 44–121)
BUN/Creatinine Ratio: 9 (ref 9–20)
BUN: 13 mg/dL (ref 6–24)
Bilirubin Total: 0.4 mg/dL (ref 0.0–1.2)
CO2: 22 mmol/L (ref 20–29)
Calcium: 10 mg/dL (ref 8.7–10.2)
Chloride: 104 mmol/L (ref 96–106)
Creatinine, Ser: 1.5 mg/dL — ABNORMAL HIGH (ref 0.76–1.27)
Globulin, Total: 2.2 g/dL (ref 1.5–4.5)
Glucose: 103 mg/dL — ABNORMAL HIGH (ref 70–99)
Potassium: 3.9 mmol/L (ref 3.5–5.2)
Sodium: 141 mmol/L (ref 134–144)
Total Protein: 6.6 g/dL (ref 6.0–8.5)
eGFR: 55 mL/min/{1.73_m2} — ABNORMAL LOW (ref >59)

## 2023-03-13 LAB — LIPASE: Lipase: 19 U/L (ref 13–78)

## 2023-03-13 LAB — AMYLASE: Amylase: 42 U/L (ref 31–110)

## 2023-03-14 ENCOUNTER — Ambulatory Visit (HOSPITAL_COMMUNITY): Payer: Medicare Other | Admitting: Licensed Clinical Social Worker

## 2023-03-14 ENCOUNTER — Encounter (HOSPITAL_COMMUNITY): Payer: Self-pay

## 2023-03-14 ENCOUNTER — Telehealth: Payer: Self-pay | Admitting: Family Medicine

## 2023-03-14 MED ORDER — PANCRELIPASE (LIP-PROT-AMYL) 12000-38000 UNITS PO CPEP: 24000.0000 [IU] | ORAL_CAPSULE | Freq: Three times a day (TID) | ORAL | 5 refills | Status: DC

## 2023-03-14 NOTE — Telephone Encounter (Signed)
done

## 2023-03-14 NOTE — Telephone Encounter (Signed)
Pt needs creon sent to [place], [place]

## 2023-03-17 ENCOUNTER — Ambulatory Visit (HOSPITAL_COMMUNITY): Payer: Medicare Other

## 2023-03-19 ENCOUNTER — Ambulatory Visit (HOSPITAL_COMMUNITY): Payer: Medicare Other

## 2023-03-21 ENCOUNTER — Ambulatory Visit (HOSPITAL_COMMUNITY): Payer: Medicare Other

## 2023-03-24 ENCOUNTER — Ambulatory Visit (HOSPITAL_COMMUNITY): Payer: Medicare Other

## 2023-03-26 ENCOUNTER — Ambulatory Visit (HOSPITAL_COMMUNITY): Payer: Medicare Other

## 2023-03-27 ENCOUNTER — Telehealth: Payer: Self-pay | Admitting: Family Medicine

## 2023-03-27 NOTE — Telephone Encounter (Signed)
Called patient to schedule Medicare Annual Wellness Visit (AWV). Left message for patient to call back and schedule Medicare Annual Wellness Visit (AWV).  Last date of AWV: 03/22/22  Please schedule an appointment at any time with [facility].  If any questions, please contact me at [phone].  Thank you ,  Barkley Boards AWV direct phone # [phone]

## 2023-03-28 ENCOUNTER — Ambulatory Visit (HOSPITAL_COMMUNITY): Payer: Medicare Other

## 2023-03-31 ENCOUNTER — Ambulatory Visit: Payer: Medicare Other | Admitting: Family Medicine

## 2023-03-31 ENCOUNTER — Telehealth (HOSPITAL_COMMUNITY): Payer: Self-pay | Admitting: Licensed Clinical Social Worker

## 2023-03-31 ENCOUNTER — Ambulatory Visit (HOSPITAL_COMMUNITY): Payer: Medicare Other

## 2023-03-31 NOTE — Telephone Encounter (Signed)
The therapist receives a voicemail from [place] requesting to schedule an individual session with this therapist indicating that he would like to be seen on a monthly basis versus doing CD IOP. He says that his pancreatitis is better and that he has gained back over 10 pounds.  The therapist will request that the Reception desk contact him to assist him in getting scheduled.   Adam Phenix, [place], LCSW, [facility], [place] 03/31/2023

## 2023-04-01 ENCOUNTER — Encounter (HOSPITAL_COMMUNITY): Payer: Self-pay

## 2023-04-01 ENCOUNTER — Telehealth: Payer: Self-pay | Admitting: Internal Medicine

## 2023-04-01 ENCOUNTER — Ambulatory Visit (HOSPITAL_COMMUNITY): Payer: Medicare Other | Admitting: Licensed Clinical Social Worker

## 2023-04-01 DIAGNOSIS — F909 Attention-deficit hyperactivity disorder, unspecified type: Secondary | ICD-10-CM

## 2023-04-01 DIAGNOSIS — F1220 Cannabis dependence, uncomplicated: Secondary | ICD-10-CM

## 2023-04-01 DIAGNOSIS — F39 Unspecified mood [affective] disorder: Secondary | ICD-10-CM

## 2023-04-01 DIAGNOSIS — F1020 Alcohol dependence, uncomplicated: Secondary | ICD-10-CM

## 2023-04-01 DIAGNOSIS — F1920 Other psychoactive substance dependence, uncomplicated: Secondary | ICD-10-CM

## 2023-04-01 NOTE — Progress Notes (Signed)
THERAPIST PROGRESS NOTE  Session Time: 2 p.m. to 3 p.m.   Type of Therapy: Individual   Therapist Response/Interventions: Solution Focused/The therapist makes [name] aware of how to request his mom be evaluated by the [facility] and encourages him to not confront her head-on noting that she may have no insight into her impairment and thus will become defensive. He recommends [name] and/or his family alerting her PCP so she can be evaluated and treatment.   The therapist normalizes Charlies' continued elevated anxiety as he has only been of benzodiazepines for a month-and-a-half. The therapist educates him about PAWS and the need for support encouraging him to resume meeting attendance.   Treatment Goals addressed:  Active     Substance Use and Mood Disorder     [name] will abstain from drugs and alcohol on a daily basis per self-report and random UDS while continuing to attend meetings no less than three times per week and remaining in contact with his Sponsor.  (Initial)     Start:  04/01/23    Expected End:  10/01/23         [name] will report a reduction in his symptoms of depression and anxiety as evidenced by his GAD-7 and PHQ-9 score both being a 4 or less.  (Initial)     Start:  04/01/23    Expected End:  10/01/23            Summary: [name] presents having not seen this therapist since the end of February noting that he had problems with his pancreas which finally improved recently such that he has gained about 10 pounds back in a week. He continues on Suboxone noting that on it he does not think of using which is a relief.   He says that he has not attended a meeting in three weeks noting that he went through a period of depression due to his pancrease problems. The major focus of today's session involves his problems with his mother with whom he lives. She is experiencing memory problems and [name] says that she is no longer safe driving. He tries to point  these things out to her with his mom becoming defensive and snapping at him leading to his snapping back and feeling guilty.   At the conclusion of the session, he indicates that he can talk to his sister about his mother's memory and driving problems and resume meeting attendance beginning tonight at 6 p.m.   Progress Towards Goals: Initial  Suicidal/Homicidal: No SI or HI  Plan: Return again in 4 weeks per Juanda Crumble' request.  Diagnosis: Alcohol Use Disorder, Severe; Cannabis Use Disorder, Severe; Other psychoactive substance dependence, uncomplicated ; ADHD; and Unspecified Affective Disorder  Collaboration of Care: Other N/A  Patient/Guardian was advised Release of Information must be obtained prior to any record release in order to collaborate their care with an outside provider. Patient/Guardian was advised if they have not already done so to contact the registration department to sign all necessary forms in order for [place] to release information regarding their care.   Consent: Patient/Guardian gives verbal consent for treatment and assignment of benefits for services provided during this visit. Patient/Guardian expressed understanding and agreed to proceed.   Adam Phenix, [place], LCSW, [facility], [place] 04/01/2023

## 2023-04-01 NOTE — Telephone Encounter (Signed)
Insurance company will not cover Creon but will cover ZENPEP. Please advise

## 2023-04-02 ENCOUNTER — Ambulatory Visit (HOSPITAL_COMMUNITY): Payer: Medicare Other

## 2023-04-02 MED ORDER — ZENPEP 10000-32000 UNITS PO CPEP: 2.0000 | ORAL_CAPSULE | Freq: Three times a day (TID) | ORAL | 1 refills | Status: DC

## 2023-04-02 NOTE — Telephone Encounter (Signed)
I have switched to Zenpep

## 2023-04-02 NOTE — Telephone Encounter (Signed)
Left message for pt that he was being switched

## 2023-04-04 ENCOUNTER — Ambulatory Visit (HOSPITAL_COMMUNITY): Payer: Medicare Other

## 2023-04-15 ENCOUNTER — Telehealth: Payer: Self-pay | Admitting: Family Medicine

## 2023-04-15 DIAGNOSIS — K219 Gastro-esophageal reflux disease without esophagitis: Secondary | ICD-10-CM

## 2023-04-15 MED ORDER — PANTOPRAZOLE SODIUM 40 MG PO TBEC: 40.0000 mg | DELAYED_RELEASE_TABLET | Freq: Two times a day (BID) | ORAL | 3 refills | Status: DC

## 2023-04-15 NOTE — Telephone Encounter (Signed)
Tadao called and said that you are supposed to be filling his PROTONIX even though I see it was filled prior by another provider but he says you are aware. He prefers  [facility] - [place], [place] - [address]

## 2023-04-17 DIAGNOSIS — F419 Anxiety disorder, unspecified: Secondary | ICD-10-CM | POA: Diagnosis not present

## 2023-04-17 DIAGNOSIS — F39 Unspecified mood [affective] disorder: Secondary | ICD-10-CM | POA: Diagnosis not present

## 2023-04-21 ENCOUNTER — Ambulatory Visit: Payer: Medicare Other | Admitting: Family Medicine

## 2023-04-29 ENCOUNTER — Ambulatory Visit (INDEPENDENT_AMBULATORY_CARE_PROVIDER_SITE_OTHER): Payer: Medicare Other | Admitting: Licensed Clinical Social Worker

## 2023-04-29 DIAGNOSIS — F1020 Alcohol dependence, uncomplicated: Secondary | ICD-10-CM | POA: Diagnosis not present

## 2023-04-29 DIAGNOSIS — F39 Unspecified mood [affective] disorder: Secondary | ICD-10-CM

## 2023-04-29 DIAGNOSIS — F1920 Other psychoactive substance dependence, uncomplicated: Secondary | ICD-10-CM

## 2023-04-29 DIAGNOSIS — F909 Attention-deficit hyperactivity disorder, unspecified type: Secondary | ICD-10-CM

## 2023-04-29 DIAGNOSIS — F1220 Cannabis dependence, uncomplicated: Secondary | ICD-10-CM

## 2023-04-29 NOTE — Progress Notes (Signed)
THERAPIST PROGRESS NOTE  Session Time: 2 p.m. to 3 p.m.   Type of Therapy: Individual   Therapist Response/Interventions: Solution Focused/The therapist recommends that Billey Gosling talk to his provider about possibly putting him on Prazosin and encourages him to do 90 in 90 and to get a Sponsor.   The therapist makes the observation that perhaps his mother's pressuring him to attend meetings may be making him more reluctant to do so. The therapist encourages him to stop comparing his new experience with the old and suggests that as Billey Gosling was able to overcome his anxiety and make friends before that he should be able to do it again.  He is supportive of [name]'s trying NA if he has never attended as he does not feel comfortable with AA's not allowing discussion of other substances besides alcohol.   Treatment Goals addressed:  Active     Substance Use and Mood Disorder     Billey Gosling will abstain from drugs and alcohol on a daily basis per self-report and random UDS while continuing to attend meetings no less than three times per week and remaining in contact with his Sponsor.  (Progressing)     Start:  04/01/23    Expected End:  10/01/23         Billey Gosling will report a reduction in his symptoms of depression and anxiety as evidenced by his GAD-7 and PHQ-9 score both being a 4 or less.  (Not Progressing)     Start:  04/01/23    Expected End:  10/01/23              Summary: Billey Gosling returns saying that his mom has given in on the driving thing as she has knee surgery in three weeks. He says that it went better than expected which is good as he did not have to talk with is sister.  He has been attending two to three meetings per week and no longer has the same Sponsor. His previous Sponsor fired him as he was not happy with the number of meetings Billey Gosling was attending. Billey Gosling says that he is having a hard time getting back into AA as the rooms are full of people but people he does not  know.  He recognizes that he is attending the meeting right when it starts and leaving right when it's over. He says that his anxiety level is "down a lot." He continues on Suboxone. He is now on 12 mg. He is testing positive for marijuana each time he goes but the level is going down each time.  [name] eventually admits that he did not get fired by his previous therapist but fired Government social research officer as he is not comfortable with him.   His PHQ-9 is a 6 and GAD-7 is a 4. The provider who prescribes his Suboxone also prescribes his psychotropics.   He says that the main issue that he wants to address are prison nightmares which he has almost nightly.   Progress Towards Goals: Progressing  Suicidal/Homicidal: No SI or HI  Plan: Return again in 4 weeks per Leonette Most' request.  Diagnosis: Alcohol Use Disorder, Severe; Cannabis Use Disorder, Severe; Other psychoactive substance dependence, uncomplicated ; ADHD; and Unspecified Affective Disorder  Collaboration of Care: Other N/A  Patient/Guardian was advised Release of Information must be obtained prior to any record release in order to collaborate their care with an outside provider. Patient/Guardian was advised if they have not already done so to contact the registration department to sign all necessary  forms in order for [place] to release information regarding their care.   Consent: Patient/Guardian gives verbal consent for treatment and assignment of benefits for services provided during this visit. Patient/Guardian expressed understanding and agreed to proceed.   Myrna Blazer, MA, LCSW, [facility], LCAS 04/29/2023

## 2023-05-27 ENCOUNTER — Ambulatory Visit (HOSPITAL_COMMUNITY): Payer: Medicare Other | Admitting: Licensed Clinical Social Worker

## 2023-05-29 ENCOUNTER — Ambulatory Visit (INDEPENDENT_AMBULATORY_CARE_PROVIDER_SITE_OTHER): Payer: Medicare Other | Admitting: Licensed Clinical Social Worker

## 2023-05-29 DIAGNOSIS — F39 Unspecified mood [affective] disorder: Secondary | ICD-10-CM

## 2023-05-29 DIAGNOSIS — F1020 Alcohol dependence, uncomplicated: Secondary | ICD-10-CM

## 2023-05-29 DIAGNOSIS — F1220 Cannabis dependence, uncomplicated: Secondary | ICD-10-CM

## 2023-05-29 DIAGNOSIS — F1920 Other psychoactive substance dependence, uncomplicated: Secondary | ICD-10-CM

## 2023-05-29 NOTE — Progress Notes (Signed)
THERAPIST PROGRESS NOTE  Session Time: 2:05 p.m. to 3 p.m.   Type of Therapy: Individual   Therapist Response/Interventions: Other/The therapist addresses [name]'s bewilderment that his mother can drink in a controlled fashion and not abuse her pain medication as her mother was an alcoholic. The therapist explains that just like any other inheritable trait that addiction is not a one-to-one transmission such that three siblings can have addiction problems while two may not.   The therapist observes that Billey Gosling appears to be stable at present per his PHQ-9 and GAD-7 scores and that he is doing what he needs to be doing in attending several meetings per week and working with a Marketing executive as well as continuing on his Suboxone. The therapist questions if Billey Gosling would like to schedule on a p.r.n. basis given how he is doing presently with Billey Gosling being in agreement with his plan.  The therapist encourages Billey Gosling to maintain the status quo in regard to his medications, meetings, etcetera.  Treatment Goals addressed:  Active     Substance Use and Mood Disorder     Billey Gosling will abstain from drugs and alcohol on a daily basis per self-report and random UDS while continuing to attend meetings no less than three times per week and remaining in contact with his Sponsor.  (Progressing)     Start:  04/01/23    Expected End:  10/01/23         Billey Gosling will report a reduction in his symptoms of depression and anxiety as evidenced by his GAD-7 and PHQ-9 score both being a 4 or less.  (Progressing)     Start:  04/01/23    Expected End:  10/01/23                Summary: Billey Gosling returns saying that his mother had her surgery and is doing well. He talked to his mom and she has stopped mentioning meetings. He is going to 4-5 meetings and has a Armed forces operational officer. He tried one NA meeting and did not like the format. He did not like the "hugging part" of it. He also says that there was a "lot of cussing."    He says that he has been sober since leaving inpatient at [facility] which is about 90 days. He realizes he can pick up his 90 day chip noting he will do this this evening.   He continues on Lexapro, Gabapentin, and Suboxone wit his PHQ-9 dropping from a 6 to a 1 and anxiety from a 4 to a 0.  The therapist observes that Billey Gosling currently has no clinical depression or anxiety and is doing well with his recovery from addiction so questions what he would like to address today. He says that the only thing is that it may be stressful caring for his mother as she recovers.   He continues to have prison nightmares saying that they are less thinking that this is due to talking about them. He will talk to his med prescriber about them when he sees her next month.   Progress Towards Goals: Progressing  Suicidal/Homicidal: No SI or HI  Plan: Return again in 4 weeks per Leonette Most' request.  Diagnosis: Alcohol Use Disorder, Severe; Cannabis Use Disorder, Severe; Other psychoactive substance dependence, uncomplicated ; ADHD; and Unspecified Affective Disorder  Collaboration of Care: Other N/A  Patient/Guardian was advised Release of Information must be obtained prior to any record release in order to collaborate their care with an outside provider. Patient/Guardian was advised if they have  not already done so to contact the registration department to sign all necessary forms in order for [place] to release information regarding their care.   Consent: Patient/Guardian gives verbal consent for treatment and assignment of benefits for services provided during this visit. Patient/Guardian expressed understanding and agreed to proceed.   Myrna Blazer, MA, LCSW, [facility], LCAS 05/29/2023

## 2023-06-19 DIAGNOSIS — F419 Anxiety disorder, unspecified: Secondary | ICD-10-CM | POA: Diagnosis not present

## 2023-06-19 DIAGNOSIS — F39 Unspecified mood [affective] disorder: Secondary | ICD-10-CM | POA: Diagnosis not present

## 2023-08-07 ENCOUNTER — Ambulatory Visit: Payer: Medicare Other | Admitting: Family Medicine

## 2023-08-11 DIAGNOSIS — Z79899 Other long term (current) drug therapy: Secondary | ICD-10-CM | POA: Diagnosis not present

## 2023-08-21 ENCOUNTER — Encounter: Payer: Self-pay | Admitting: Family Medicine

## 2023-08-21 ENCOUNTER — Ambulatory Visit (INDEPENDENT_AMBULATORY_CARE_PROVIDER_SITE_OTHER): Payer: Medicare Other | Admitting: Family Medicine

## 2023-08-21 VITALS — BP 150/80 | HR 82 | Temp 97.8°F | Ht 69.0 in | Wt 190.4 lb

## 2023-08-21 DIAGNOSIS — M5442 Lumbago with sciatica, left side: Secondary | ICD-10-CM

## 2023-08-21 DIAGNOSIS — Z1211 Encounter for screening for malignant neoplasm of colon: Secondary | ICD-10-CM

## 2023-08-21 DIAGNOSIS — Z8719 Personal history of other diseases of the digestive system: Secondary | ICD-10-CM

## 2023-08-21 DIAGNOSIS — F322 Major depressive disorder, single episode, severe without psychotic features: Secondary | ICD-10-CM

## 2023-08-21 DIAGNOSIS — F1011 Alcohol abuse, in remission: Secondary | ICD-10-CM

## 2023-08-21 DIAGNOSIS — F429 Obsessive-compulsive disorder, unspecified: Secondary | ICD-10-CM

## 2023-08-21 DIAGNOSIS — J4530 Mild persistent asthma, uncomplicated: Secondary | ICD-10-CM

## 2023-08-21 DIAGNOSIS — F418 Other specified anxiety disorders: Secondary | ICD-10-CM

## 2023-08-21 DIAGNOSIS — J301 Allergic rhinitis due to pollen: Secondary | ICD-10-CM

## 2023-08-21 DIAGNOSIS — M5441 Lumbago with sciatica, right side: Secondary | ICD-10-CM

## 2023-08-21 DIAGNOSIS — F411 Generalized anxiety disorder: Secondary | ICD-10-CM

## 2023-08-21 DIAGNOSIS — K219 Gastro-esophageal reflux disease without esophagitis: Secondary | ICD-10-CM

## 2023-08-21 DIAGNOSIS — F1120 Opioid dependence, uncomplicated: Secondary | ICD-10-CM

## 2023-08-21 DIAGNOSIS — K8689 Other specified diseases of pancreas: Secondary | ICD-10-CM

## 2023-08-21 NOTE — Progress Notes (Signed)
Joe Lawrence is a 52 y.o. male who presents for annual wellness visit and follow-up on chronic medical conditions.  He has the following concerns: He has started smoking again an effort to try and lose some weight.  He thinks that is also interfering with his stomach and pancreas and that his bowel habits have become more loose but not diarrhea-like.  He states that his back is doing fine.  He is taking Claritin for his allergies.  He does complain of some arthritic pain in his neck and hands.  The neck has been bothering him for the last 3 months especially on the left-hand side.  He does have underlying asthma and presently is not using Flovent and only uses a rescue inhaler maybe once per month.  He continues in counseling and on multiple medications.  He states that he feels that he is in a very good spot at the present time.  He is not having any reflux symptoms.  He continues on buprenorphine/naloxone which has helped him a lot.  He is very comfortable with that and the other medications he is getting from his therapist.   Immunizations and Health Maintenance Immunization History  Administered Date(s) Administered   Influenza, High Dose Seasonal PF 08/25/2017   Influenza,inj,Quad PF,6+ Mos 12/20/2014, 02/03/2017, 12/02/2017, 09/03/2018, 09/22/2019, 12/14/2020   Influenza-Unspecified 09/07/2021   PFIZER(Purple Top)SARS-COV-2 Vaccination 03/23/2020, 04/17/2020, 10/27/2020, 04/06/2021   PNEUMOCOCCAL CONJUGATE-20 02/14/2023   PPD Test 04/22/1985   Pfizer Covid-19 Vaccine Bivalent Booster 71yrs & up 09/07/2021   Pneumococcal Polysaccharide-23 09/29/2012   Tdap 08/14/2021   Zoster Recombinant(Shingrix) 08/14/2021, 10/23/2021   Health Maintenance Due  Topic Date Due   COVID-19 Vaccine (6 - 2023-24 season) 08/30/2022   Fecal DNA (Cologuard)  10/11/2022   INFLUENZA VACCINE  07/31/2023    Last colonoscopy: never Last PSA: never   Dentist: more than 1 year ago Ophtho: 2 year  ago Exercise: 5 times a week  Other doctors caring for patient include: Behavioral health: Dr. Alene Mires   Advanced Directives:  no  Depression screen:  See questionnaire below.        05/29/2023    2:28 PM 04/29/2023    2:30 PM 02/26/2023    4:23 PM 03/22/2022    3:09 PM 05/11/2021    9:27 AM  Depression screen PHQ 2/9  Decreased Interest    3 0  Down, Depressed, Hopeless    3 0  PHQ - 2 Score    6 0  Altered sleeping    3 0  Tired, decreased energy    3 0  Change in appetite    0 0  Feeling bad or failure about yourself     3 0  Trouble concentrating    3 3  Moving slowly or fidgety/restless    0 3  Suicidal thoughts    0 0  PHQ-9 Score    18 6  Difficult doing work/chores    Very difficult Somewhat difficult     Information is confidential and restricted. Go to Review Flowsheets to unlock data.    Fall Screen: See Questionaire below.      08/21/2023    3:34 PM 03/22/2022    3:08 PM 09/22/2019    1:55 PM 02/03/2017   11:14 AM  Fall Risk   Falls in the past year? 0 0 0 No  Number falls in past yr: 0     Injury with Fall? 0     Risk for fall due  to : No Fall Risks Medication side effect    Follow up Falls evaluation completed Falls evaluation completed;Education provided;Falls prevention discussed      ADL screen:  See questionnaire below.  Functional Status Survey: Is the patient deaf or have difficulty hearing?: No Does the patient have difficulty seeing, even when wearing glasses/contacts?: No Does the patient have difficulty concentrating, remembering, or making decisions?: No Does the patient have difficulty walking or climbing stairs?: No Does the patient have difficulty dressing or bathing?: No Does the patient have difficulty doing errands alone such as visiting a doctor's office or shopping?: No   Review of Systems  Constitutional: -, -unexpected weight change, -anorexia, -fatigue Allergy: -sneezing, -itching, -congestion  Respiratory: -cough,  -shortness of breath, -dyspnea on exertion, -wheezing,  Gastroenterology: -abdominal pain, -nausea, -vomiting, -diarrhea, -constipation, -dysphagia Hematology: -bleeding or bruising problems Musculoskeletal: -arthralgias, -myalgias, -joint swelling, -back pain, - Ophthalmology: -vision changes,  Urology: -dysuria, -difficulty urinating,  -urinary frequency, -urgency, incontinence Neurology: -, -numbness, , -memory loss, -falls, -dizziness    PHYSICAL EXAM:  BP (!) 150/80   Pulse 82   Temp 97.8 F (36.6 C) (Oral)   Ht 5\' 9"  (1.753 m)   Wt 190 lb 6.4 oz (86.4 kg)   SpO2 95% Comment: room air  BMI 28.12 kg/m   General Appearance: Alert, cooperative, no distress, appears stated age Head: Normocephalic, without obvious abnormality, atraumatic.  Exam of the left neck shows no particular concerns. Eyes: PERRL, conjunctiva/corneas clear, EOM's intact,  Ears: Normal TM's and external ear canals Nose: Nares normal, mucosa normal, no drainage or sinus   tenderness Throat: Lips, mucosa, and tongue normal; teeth and gums normal Neck: Supple, no lymphadenopathy, thyroid:no enlargement/tenderness/nodules; no carotid bruit or JVD Lungs: Clear to auscultation bilaterally without wheezes, rales or ronchi; respirations unlabored Heart: Regular rate and rhythm, S1 and S2 normal, no murmur, rub or gallop Abdomen: Soft, non-tender, nondistended, normoactive bowel sounds, no masses, no hepatosplenomegaly Skin: Skin color, texture, turgor normal, no rashes or lesions Lymph nodes: Cervical, supraclavicular, and axillary nodes normal Neurologic: CNII-XII intact, normal strength, sensation and gait; reflexes 2+ and symmetric throughout   Psych: Normal mood, affect, hygiene and grooming  ASSESSMENT/PLAN: Bilateral low back pain with bilateral sciatica, unspecified chronicity  History of alcohol abuse  Generalized anxiety disorder  Gastroesophageal reflux disease without esophagitis  Depression with  anxiety  Mild persistent asthma without complication  Obsessive-compulsive disorder, unspecified type  Severe major depression, single episode, without psychotic features (HCC)  Screening for colon cancer - Plan: Cologuard  History of pancreatitis - Plan: Lipase, Amylase, Comprehensive metabolic panel  Chronic seasonal allergic rhinitis due to pollen  Opioid use disorder, severe, dependence (HCC)  Pancreatic insufficiency   Overall he seems to doing quite nicely.  I did recommend that he use Tylenol for his aches and pains and to avoid using the NSAIDs if he can.  Immunization recommendations discussed.  Colonoscopy recommendations reviewed.  I will do amylase and lipase at his request.  Strongly encouraged him to go ahead and quit smoking again and that he does not need a medication to help with that as he is only smoking half a pack per day.   Medicare Attestation I have personally reviewed: The patient's medical and social history Their use of alcohol, tobacco or illicit drugs Their current medications and supplements The patient's functional ability including ADLs,fall risks, home safety risks, cognitive, and hearing and visual impairment Diet and physical activities Evidence for depression or mood disorders  The  patient's weight, height, and BMI have been recorded in the chart.  I have made referrals, counseling, and provided education to the patient based on review of the above and I have provided the patient with a written personalized care plan for preventive services.     Sharlot Gowda, MD   08/21/2023

## 2023-08-22 LAB — COMPREHENSIVE METABOLIC PANEL
ALT: 10 IU/L (ref 0–44)
AST: 22 IU/L (ref 0–40)
Albumin: 4.5 g/dL (ref 3.8–4.9)
Alkaline Phosphatase: 88 IU/L (ref 44–121)
BUN/Creatinine Ratio: 14 (ref 9–20)
BUN: 14 mg/dL (ref 6–24)
Bilirubin Total: 0.4 mg/dL (ref 0.0–1.2)
CO2: 22 mmol/L (ref 20–29)
Calcium: 9.6 mg/dL (ref 8.7–10.2)
Chloride: 102 mmol/L (ref 96–106)
Creatinine, Ser: 0.99 mg/dL (ref 0.76–1.27)
Globulin, Total: 2.3 g/dL (ref 1.5–4.5)
Glucose: 89 mg/dL (ref 70–99)
Potassium: 4.1 mmol/L (ref 3.5–5.2)
Sodium: 138 mmol/L (ref 134–144)
Total Protein: 6.8 g/dL (ref 6.0–8.5)
eGFR: 91 mL/min/{1.73_m2} (ref >59)

## 2023-08-22 LAB — LIPASE: Lipase: 14 U/L (ref 13–78)

## 2023-08-22 LAB — AMYLASE: Amylase: 52 U/L (ref 31–110)

## 2023-09-02 DIAGNOSIS — Z1211 Encounter for screening for malignant neoplasm of colon: Secondary | ICD-10-CM | POA: Diagnosis not present

## 2023-09-05 ENCOUNTER — Telehealth: Payer: Self-pay | Admitting: Family Medicine

## 2023-09-05 ENCOUNTER — Other Ambulatory Visit: Payer: Self-pay

## 2023-09-05 ENCOUNTER — Encounter: Payer: Self-pay | Admitting: Nurse Practitioner

## 2023-09-05 ENCOUNTER — Telehealth (INDEPENDENT_AMBULATORY_CARE_PROVIDER_SITE_OTHER): Payer: Medicare Other | Admitting: Nurse Practitioner

## 2023-09-05 VITALS — BP 129/79 | Temp 100.8°F | Wt 183.0 lb

## 2023-09-05 DIAGNOSIS — U071 COVID-19: Secondary | ICD-10-CM

## 2023-09-05 MED ORDER — NIRMATRELVIR/RITONAVIR (PAXLOVID)TABLET: 3.0000 | ORAL_TABLET | Freq: Two times a day (BID) | ORAL | 0 refills | Status: DC

## 2023-09-05 MED ORDER — NIRMATRELVIR/RITONAVIR (PAXLOVID)TABLET: 3.0000 | ORAL_TABLET | Freq: Two times a day (BID) | ORAL | 0 refills | Status: AC

## 2023-09-05 NOTE — Progress Notes (Signed)
Virtual Visit Encounter mychart visit.   I connected with  Althea Grimmer on 09/05/23 at  3:00 PM EDT by secure video and audio telemedicine application. I verified that I am speaking with the correct person using two identifiers.   I introduced myself as a Publishing rights manager with the practice. The limitations of evaluation and management by telemedicine discussed with the patient and the availability of in person appointments. The patient expressed verbal understanding and consent to proceed.  Participating parties in this visit include: Myself and patient  The patient is: Patient Location: Home I am: Provider Location: Office/Clinic Subjective:    CC and HPI: Yasim Derck is a 52 y.o. year old male presenting for new evaluation and treatment of cough, congestion, body aches. Patient reports the following:  Javaun presents today with concerns new onset over the last 3 days of fever, body aches, headache, cough, congestion, loss of taste (as of today), fatigue, and nausea. He reports that he has been taking tylenol to help, but this has not been very effective. He expresses concerns about possible [place] Virus as he was outdoors smoking and was bit by a mosquito recently. He has not tested for COVID as of yet, but he does have a test at home.   Past medical history, Surgical history, Family history not pertinant except as noted below, Social history, Allergies, and medications have been entered into the medical record, reviewed, and corrections made.   Review of Systems:  All review of systems negative except what is listed in the HPI  Objective:    Alert and oriented x 4 Audibly congested. Cough present.  Speaking in clear sentences with no shortness of breath. No distress.  Impression and Recommendations:    Problem List Items Addressed This Visit     COVID-19 - Primary    Symptoms and presentation are significantly consistent with COVID 19 infection  symptoms. I discussed with patient that we have seen a tremendous influx in covid positive cases in the immediate area in the last few weeks and even more in the last 2 days. We have seen patients who initially test negative, but after day 6-7 test positive, which does make it very difficult to reduce the risks of spreading the virus and prevents appropriate treatment as the time frame to initiate is limited to the first 5 days.  At this time I recommend at home testing, but will send in the order for paxlovid for management. If the testing is negative, I still recommend starting the treatment as it will not cause harm, but in the event of a false negative, it can do a great deal of benefit.  It is highly unlikely that he has contracted [place] Nile Virus from the mosquito bite recently. While there have been 3 positive cases of [place] in [place] in 2024, these cases were all significantly west of our area.   Addend: The patient called back to report his test was negative for COVID. I recommend continue with paxlovid treatment while he is within the treatment window and use all normal COVID precautions. He may retest tomorrow to see if there is a positive result.  We did let him know that there is a blood test that can be performed for WNV as he mentioned his concerns about this, again. He may come in to have this blood draw performed if he would like.        orders and follow up as documented in EMR I discussed  the assessment and treatment plan with the patient. The patient was provided an opportunity to ask questions and all were answered. The patient agreed with the plan and demonstrated an understanding of the instructions.   The patient was advised to call back or seek an in-person evaluation if the symptoms worsen or if the condition fails to improve as anticipated.  Follow-Up: prn  I provided 25 minutes of non-face-to-face interaction with this non face-to-face encounter including intake,  same-day documentation, and chart review.   Tollie Eth, NP , DNP, AGNP-c [facility]

## 2023-09-05 NOTE — Telephone Encounter (Signed)
Pt needs paxlovid sent to [id number] - [place], [place] - [address] AT [facility] OF [address], other pharmacy is out of stock

## 2023-09-05 NOTE — Assessment & Plan Note (Signed)
Symptoms and presentation are significantly consistent with COVID 19 infection symptoms. I discussed with patient that we have seen a tremendous influx in covid positive cases in the immediate area in the last few weeks and even more in the last 2 days. We have seen patients who initially test negative, but after day 6-7 test positive, which does make it very difficult to reduce the risks of spreading the virus and prevents appropriate treatment as the time frame to initiate is limited to the first 5 days.  At this time I recommend at home testing, but will send in the order for paxlovid for management. If the testing is negative, I still recommend starting the treatment as it will not cause harm, but in the event of a false negative, it can do a great deal of benefit.  It is highly unlikely that he has contracted [place] Virus from the mosquito bite recently. While there have been 3 positive cases of [place] in [place] in 2024, these cases were all significantly west of our area.   Addend: The patient called back to report his test was negative for COVID. I recommend continue with paxlovid treatment while he is within the treatment window and use all normal COVID precautions. He may retest tomorrow to see if there is a positive result.  We did let him know that there is a blood test that can be performed for WNV as he mentioned his concerns about this, again. He may come in to have this blood draw performed if he would like.

## 2023-09-07 LAB — COLOGUARD: COLOGUARD: NEGATIVE

## 2023-09-11 DIAGNOSIS — Z23 Encounter for immunization: Secondary | ICD-10-CM | POA: Diagnosis not present

## 2023-10-01 DIAGNOSIS — Z23 Encounter for immunization: Secondary | ICD-10-CM | POA: Diagnosis not present

## 2023-10-30 DIAGNOSIS — F419 Anxiety disorder, unspecified: Secondary | ICD-10-CM | POA: Diagnosis not present

## 2023-10-30 DIAGNOSIS — F39 Unspecified mood [affective] disorder: Secondary | ICD-10-CM | POA: Diagnosis not present

## 2023-11-06 ENCOUNTER — Encounter: Payer: Self-pay | Admitting: Family Medicine

## 2023-11-06 ENCOUNTER — Ambulatory Visit (INDEPENDENT_AMBULATORY_CARE_PROVIDER_SITE_OTHER): Payer: Medicare Other | Admitting: Family Medicine

## 2023-11-06 VITALS — BP 130/70 | HR 80 | Ht 69.0 in | Wt 191.6 lb

## 2023-11-06 DIAGNOSIS — F418 Other specified anxiety disorders: Secondary | ICD-10-CM

## 2023-11-06 DIAGNOSIS — Z8719 Personal history of other diseases of the digestive system: Secondary | ICD-10-CM

## 2023-11-06 DIAGNOSIS — Z5181 Encounter for therapeutic drug level monitoring: Secondary | ICD-10-CM

## 2023-11-06 DIAGNOSIS — M5442 Lumbago with sciatica, left side: Secondary | ICD-10-CM

## 2023-11-06 DIAGNOSIS — F1120 Opioid dependence, uncomplicated: Secondary | ICD-10-CM

## 2023-11-06 DIAGNOSIS — R5383 Other fatigue: Secondary | ICD-10-CM

## 2023-11-06 DIAGNOSIS — Z79899 Other long term (current) drug therapy: Secondary | ICD-10-CM | POA: Diagnosis not present

## 2023-11-06 DIAGNOSIS — D649 Anemia, unspecified: Secondary | ICD-10-CM | POA: Diagnosis not present

## 2023-11-06 DIAGNOSIS — M62830 Muscle spasm of back: Secondary | ICD-10-CM

## 2023-11-06 DIAGNOSIS — M5441 Lumbago with sciatica, right side: Secondary | ICD-10-CM | POA: Diagnosis not present

## 2023-11-06 MED ORDER — METHOCARBAMOL 500 MG PO TABS: 500.0000 mg | ORAL_TABLET | Freq: Three times a day (TID) | ORAL | 0 refills | Status: DC | PRN

## 2023-11-06 NOTE — Progress Notes (Signed)
Chief Complaint  Patient presents with   Back Pain    Low back pain x 2 months. Has severe pain inner thighs, outer thighs, legs and entire middle of his body. When he eats sugary foods it makes it worse. Takes seroquel and has gained 50lbs in about 6-7 months. 4 days ago he bent over to pick up his shoes and his back seized up. Has had really low energy. He is wondering if he could have had a mild heart attack vs back spasm. He doesn't feel well, he is scared and would like labs. (Kidneys, white blood cells, B12, lipase, pancreas).   LBP x 2 months, worse on R lower back. Asking for referral to ortho. Has seen Dr. Farris Has in the past (ortho) Feels some sort of "soft tumor" on R lower back x several months.  No change in size, not sore to touch, not what is painful/bothering him.  Has radiculopathy in the R leg, sometimes into the R great toe, moves around. Occ also notes on the left.  Heat helps.  He has aches and pains all over, hurts to touch--inner thighs. lower legs mainly, at medial knees. Sometimes hurts to touch arms too, not currently.  4 days ago he bent down to reach something--he felt a spasm across thoracic back, which radiated around to his chest. He is worried this could have been a heart attack. He hasn't had any pain since.  Takes suboxone for opioid use disorder.  "I'm an addict".   Dose was increased this month due to pain. It has helped some. He is on gabapentin chronically.  He has a h/o chronic LBP with sciatica, per chart review. Last MRI 03/2012--Mild osseus and disk changes, no focal herniation.  No imaging since.  He is worried about diabetes, kidneys.  Wanting labs done. Last saw [facility] for AWV.  Labs done at that time.  No weight change since that visit. States he weighed 150 most of his life. Chart and recent labs reviewed   PMH, PSH, SH reviewed   Outpatient Encounter Medications as of 11/06/2023  Medication Sig Note   Buprenorphine HCl-Naloxone HCl  (SUBOXONE) 8-2 MG FILM Place 0.5 each under the tongue 5 (five) times daily.    cloNIDine (CATAPRES) 0.1 MG tablet Take 0.1 mg by mouth 2 (two) times daily.    escitalopram (LEXAPRO) 20 MG tablet Take 1 tablet (20 mg total) by mouth daily.    gabapentin (NEURONTIN) 300 MG capsule Take 2 capsules (600 mg total) by mouth 4 (four) times daily.    olopatadine (PATADAY) 0.1 % ophthalmic solution Place 1 drop into both eyes daily as needed (itching).    pantoprazole (PROTONIX) 40 MG tablet Take 1 tablet (40 mg total) by mouth 2 (two) times daily.    prazosin (MINIPRESS) 1 MG capsule Take 1 mg by mouth at bedtime.    QUEtiapine (SEROQUEL) 50 MG tablet Take 1 tablet (50 mg total) by mouth 2 (two) times daily. 11/06/2023: 50mg  qam, 50mg  qnoon, 50mg  q6pm, 150mg  qpm   [DISCONTINUED] XIIDRA 5 % SOLN     levalbuterol (XOPENEX) 0.63 MG/3ML nebulizer solution Take 0.63 mg by nebulization every 4 (four) hours as needed for wheezing or shortness of breath. (Patient not taking: Reported on 11/06/2023) 11/06/2023: As needed   [DISCONTINUED] buprenorphine-naloxone (SUBOXONE) 8-2 mg SUBL SL tablet Place 0.5 tablets under the tongue 2 (two) times daily.    [DISCONTINUED] fluticasone (FLOVENT HFA) 220 MCG/ACT inhaler Inhale 2 puffs into the lungs 2 (two)  times daily as needed (shortness of breath). (Patient not taking: Reported on 11/06/2023) 11/06/2023: As needed   [DISCONTINUED] hydrOXYzine (VISTARIL) 50 MG capsule Take 50 mg by mouth 3 (three) times daily as needed. (Patient not taking: Reported on 09/05/2023)    [DISCONTINUED] Pancrelipase, Lip-Prot-Amyl, (ZENPEP) 10000-32000 units CPEP Take 2 tablets by mouth 3 (three) times daily. (Patient not taking: Reported on 11/06/2023)    No facility-administered encounter medications on file as of 11/06/2023.   Allergies  Allergen Reactions   Celexa [Citalopram]     Muscle tighness   Chlorpromazine Hcl Other (See Comments)    Pt does not remember this allergy.  May be 20 years  ago.   Iodine     Skin has burning sensation.   Sulfonamide Derivatives     Patient does not know of this allergy or the reaction.   Voltaren [Diclofenac Sodium]     voltaren has caused his arm to swell in the past, does not remember when this happened.     ROS: No f/c, URI symptoms.  No GI complaints. +fatigue +LBP per HPI. No bladder/bowel changes No numbness/tingling or weakness. No polydipsia, polyuria. No chest pain, shortness of breath, DOE. Episode of thoracic back pain radiating to chest just once, per HPI. Reports sobriety.    PHYSICAL EXAM:  BP 130/70   Pulse 80   Ht 5\' 9"  (1.753 m)   Wt 191 lb 9.6 oz (86.9 kg)   BMI 28.29 kg/m   Wt Readings from Last 3 Encounters:  11/06/23 191 lb 9.6 oz (86.9 kg)  09/05/23 183 lb (83 kg)  08/21/23 190 lb 6.4 oz (86.4 kg)   Anxious male, in no acute distress. HEENT: conjunctiva and sclera are clear, EOMI Heart: regular rate and rhythm Lungs: clear bilaterally Abdomen: soft, nontender, no mass Neck: no lymphadenopathy or mass, no cervical tenderness Back: no spinal tenderness--tender at R lower back, above SI joint and along paraspinous muscles. +spasm. Mobile cyst just lateral to R SI joint, nontender. Neuro: alert and oriented.  Cranial nerves grossly intact. Normal strength. DTR's 2+ and symmetric.  Negative SLR. Psych: anxious, normal affect. Normal hygiene, grooming, eye contact and speech.    ASSESSMENT/PLAN:  Bilateral low back pain with bilateral sciatica, unspecified chronicity - chronic LBP; current exacerbation related to muscle spasm. Trial robaxin and PT. (PCP can consider referral if not responding) - Plan: Ambulatory referral to Physical Therapy  Muscle spasm of back - heat, stretches, massage/supportive measures reviewed.  Muscle relaxant and PT - Plan: Ambulatory referral to Physical Therapy, methocarbamol (ROBAXIN) 500 MG tablet  Medication monitoring encounter - Plan: Comprehensive metabolic panel, CBC  with Differential/Platelet, Vitamin B12, Magnesium  Fatigue, unspecified type - check labs. Complex history. If ongoing fatigue, will defer back to PCP for further eval. - Plan: Comprehensive metabolic panel, CBC with Differential/Platelet, TSH  Depression with anxiety - under care of psych. All questions answered, reassured, and will recheck labs. weight stable, no sx DM or cardiac dz.  Opioid use disorder, severe, dependence (HCC) - compliant with clinic and suboxone.  History of pancreatitis - reassured--normal abdominal exam, no GI complaints. No need for labs to check this.  Significant time spent trying to familiarize myself with pt's history, given his many complaints and anxiety about his health today.  Reviewed recent labs with pt (nl labs 01/2023, 07/2023)  All questions answered.  Unclear etiology for his diffuse pain in his LE's, other than neuropathy. Given his meds (such as longterm PPI), will recheck labs.  Already on gabapentin.  No pain meds for him.  Advised that muscle relaxants will help with his RLB where there is spasm, not his more diffuse pain (setting expectations).  I spent 45-50 minutes dedicated to the care of this patient, including pre-visit review of records, face to face time, post-visit ordering of testing and documentation.

## 2023-11-07 ENCOUNTER — Encounter: Payer: Self-pay | Admitting: Family Medicine

## 2023-11-07 LAB — COMPREHENSIVE METABOLIC PANEL
ALT: 12 [IU]/L (ref 0–44)
AST: 17 [IU]/L (ref 0–40)
Albumin: 4.2 g/dL (ref 3.8–4.9)
Alkaline Phosphatase: 95 [IU]/L (ref 44–121)
BUN/Creatinine Ratio: 13 (ref 9–20)
BUN: 12 mg/dL (ref 6–24)
Bilirubin Total: 0.2 mg/dL (ref 0.0–1.2)
CO2: 22 mmol/L (ref 20–29)
Calcium: 9.2 mg/dL (ref 8.7–10.2)
Chloride: 98 mmol/L (ref 96–106)
Creatinine, Ser: 0.95 mg/dL (ref 0.76–1.27)
Globulin, Total: 2.6 g/dL (ref 1.5–4.5)
Glucose: 91 mg/dL (ref 70–99)
Potassium: 4.1 mmol/L (ref 3.5–5.2)
Sodium: 136 mmol/L (ref 134–144)
Total Protein: 6.8 g/dL (ref 6.0–8.5)
eGFR: 95 mL/min/{1.73_m2} (ref >59)

## 2023-11-07 LAB — CBC WITH DIFFERENTIAL/PLATELET
Basophils Absolute: 0 10*3/uL (ref 0.0–0.2)
Basos: 1 %
EOS (ABSOLUTE): 0.2 10*3/uL (ref 0.0–0.4)
Eos: 4 %
Hematocrit: 36.3 % — ABNORMAL LOW (ref 37.5–51.0)
Hemoglobin: 11.6 g/dL — ABNORMAL LOW (ref 13.0–17.7)
Immature Grans (Abs): 0 10*3/uL (ref 0.0–0.1)
Immature Granulocytes: 0 %
Lymphocytes Absolute: 1.1 10*3/uL (ref 0.7–3.1)
Lymphs: 22 %
MCH: 28.2 pg (ref 26.6–33.0)
MCHC: 32 g/dL (ref 31.5–35.7)
MCV: 88 fL (ref 79–97)
Monocytes Absolute: 0.7 10*3/uL (ref 0.1–0.9)
Monocytes: 14 %
Neutrophils Absolute: 2.9 10*3/uL (ref 1.4–7.0)
Neutrophils: 59 %
Platelets: 268 10*3/uL (ref 150–450)
RBC: 4.11 x10E6/uL — ABNORMAL LOW (ref 4.14–5.80)
RDW: 14.3 % (ref 11.6–15.4)
WBC: 5.1 10*3/uL (ref 3.4–10.8)

## 2023-11-07 LAB — TSH: TSH: 1.94 u[IU]/mL (ref 0.450–4.500)

## 2023-11-07 LAB — VITAMIN B12: Vitamin B-12: 401 pg/mL (ref 232–1245)

## 2023-11-07 LAB — MAGNESIUM: Magnesium: 2 mg/dL (ref 1.6–2.3)

## 2023-11-10 ENCOUNTER — Telehealth: Payer: Self-pay | Admitting: *Deleted

## 2023-11-10 ENCOUNTER — Other Ambulatory Visit: Payer: Self-pay | Admitting: *Deleted

## 2023-11-10 DIAGNOSIS — M62830 Muscle spasm of back: Secondary | ICD-10-CM

## 2023-11-10 DIAGNOSIS — D508 Other iron deficiency anemias: Secondary | ICD-10-CM

## 2023-11-10 MED ORDER — METHOCARBAMOL 500 MG PO TABS: 500.0000 mg | ORAL_TABLET | Freq: Three times a day (TID) | ORAL | 0 refills | Status: DC | PRN

## 2023-11-10 NOTE — Telephone Encounter (Signed)
Patient called and stated that the robaxin is helping. He scheduled with PT. He has to pay out of pocket for the robaxin, the 20 tablets were $17.50 and that was for 4 days worth. He is asking if you will send in a refill for 2 weeks or for 1 month worth.

## 2023-11-10 NOTE — Telephone Encounter (Signed)
Spoke with patient and advised of Dr. Delford Field recommendations. He would be very pleased if he could get into another place sooner-sent message to [place].

## 2023-11-10 NOTE — Telephone Encounter (Signed)
Directions state to use 1-2 every 8 hours as needed. As he is improving, he should be able to cut back to 1 tablet at a time, and they will last longer (or may need it less frequently). I'm sending in another #40 only.  Should be plenty to get him through this. Hopefully PT will be very helpful, and he won't need as much of the muscle relaxant.  That is the plan.  Looks like PT isn't scheduled until 11/27.  If he wants, we can try changing the referral to somewhere else that can get him in sooner (such as [facility] or [facility]). See if he wants [place] to try (and then contact Caryn to change referral).  Thanks

## 2023-11-11 DIAGNOSIS — D508 Other iron deficiency anemias: Secondary | ICD-10-CM | POA: Diagnosis not present

## 2023-11-11 LAB — IRON,TIBC AND FERRITIN PANEL
Ferritin: 65 ng/mL (ref 30–400)
Iron Saturation: 7 % — CL (ref 15–55)
Iron: 22 ug/dL — ABNORMAL LOW (ref 38–169)
Total Iron Binding Capacity: 294 ug/dL (ref 250–450)
UIBC: 272 ug/dL (ref 111–343)

## 2023-11-11 LAB — SPECIMEN STATUS REPORT

## 2023-11-14 LAB — FECAL OCCULT BLOOD, IMMUNOCHEMICAL: Fecal Occult Bld: NEGATIVE

## 2023-11-17 DIAGNOSIS — M5416 Radiculopathy, lumbar region: Secondary | ICD-10-CM | POA: Diagnosis not present

## 2023-11-18 ENCOUNTER — Other Ambulatory Visit: Payer: Self-pay | Admitting: Family Medicine

## 2023-11-18 DIAGNOSIS — K219 Gastro-esophageal reflux disease without esophagitis: Secondary | ICD-10-CM

## 2023-11-21 DIAGNOSIS — M5416 Radiculopathy, lumbar region: Secondary | ICD-10-CM | POA: Diagnosis not present

## 2023-11-26 ENCOUNTER — Ambulatory Visit: Payer: Medicare Other | Admitting: Physical Therapy

## 2023-11-26 DIAGNOSIS — M5416 Radiculopathy, lumbar region: Secondary | ICD-10-CM | POA: Diagnosis not present

## 2023-11-28 DIAGNOSIS — M5416 Radiculopathy, lumbar region: Secondary | ICD-10-CM | POA: Diagnosis not present

## 2023-12-09 ENCOUNTER — Other Ambulatory Visit: Payer: Self-pay

## 2023-12-09 ENCOUNTER — Inpatient Hospital Stay (HOSPITAL_COMMUNITY)
Admission: EM | Admit: 2023-12-09 | Discharge: 2023-12-14 | DRG: 193 | Disposition: A | Discharge status: Home / Self Care | Payer: Medicare Other | Attending: Internal Medicine | Admitting: Internal Medicine

## 2023-12-09 ENCOUNTER — Encounter (HOSPITAL_COMMUNITY): Payer: Self-pay

## 2023-12-09 ENCOUNTER — Emergency Department (HOSPITAL_COMMUNITY): Payer: Medicare Other

## 2023-12-09 DIAGNOSIS — F1120 Opioid dependence, uncomplicated: Secondary | ICD-10-CM | POA: Diagnosis present

## 2023-12-09 DIAGNOSIS — Z882 Allergy status to sulfonamides status: Secondary | ICD-10-CM

## 2023-12-09 DIAGNOSIS — K219 Gastro-esophageal reflux disease without esophagitis: Secondary | ICD-10-CM | POA: Diagnosis present

## 2023-12-09 DIAGNOSIS — Z9151 Personal history of suicidal behavior: Secondary | ICD-10-CM

## 2023-12-09 DIAGNOSIS — F419 Anxiety disorder, unspecified: Secondary | ICD-10-CM | POA: Diagnosis present

## 2023-12-09 DIAGNOSIS — J9601 Acute respiratory failure with hypoxia: Secondary | ICD-10-CM | POA: Diagnosis present

## 2023-12-09 DIAGNOSIS — J189 Pneumonia, unspecified organism: Secondary | ICD-10-CM | POA: Diagnosis not present

## 2023-12-09 DIAGNOSIS — E66811 Obesity, class 1: Secondary | ICD-10-CM | POA: Diagnosis present

## 2023-12-09 DIAGNOSIS — Z811 Family history of alcohol abuse and dependence: Secondary | ICD-10-CM

## 2023-12-09 DIAGNOSIS — D649 Anemia, unspecified: Secondary | ICD-10-CM | POA: Diagnosis present

## 2023-12-09 DIAGNOSIS — R0602 Shortness of breath: Principal | ICD-10-CM

## 2023-12-09 DIAGNOSIS — F17210 Nicotine dependence, cigarettes, uncomplicated: Secondary | ICD-10-CM | POA: Diagnosis present

## 2023-12-09 DIAGNOSIS — E8770 Fluid overload, unspecified: Secondary | ICD-10-CM | POA: Diagnosis present

## 2023-12-09 DIAGNOSIS — Z79899 Other long term (current) drug therapy: Secondary | ICD-10-CM

## 2023-12-09 DIAGNOSIS — J45909 Unspecified asthma, uncomplicated: Secondary | ICD-10-CM | POA: Diagnosis present

## 2023-12-09 DIAGNOSIS — F322 Major depressive disorder, single episode, severe without psychotic features: Secondary | ICD-10-CM | POA: Diagnosis present

## 2023-12-09 DIAGNOSIS — F1011 Alcohol abuse, in remission: Secondary | ICD-10-CM | POA: Diagnosis not present

## 2023-12-09 DIAGNOSIS — Z6830 Body mass index (BMI) 30.0-30.9, adult: Secondary | ICD-10-CM

## 2023-12-09 DIAGNOSIS — Z1152 Encounter for screening for COVID-19: Secondary | ICD-10-CM

## 2023-12-09 DIAGNOSIS — Z888 Allergy status to other drugs, medicaments and biological substances status: Secondary | ICD-10-CM

## 2023-12-09 DIAGNOSIS — E8809 Other disorders of plasma-protein metabolism, not elsewhere classified: Secondary | ICD-10-CM | POA: Diagnosis present

## 2023-12-09 DIAGNOSIS — D72819 Decreased white blood cell count, unspecified: Secondary | ICD-10-CM | POA: Diagnosis present

## 2023-12-09 DIAGNOSIS — E871 Hypo-osmolality and hyponatremia: Secondary | ICD-10-CM | POA: Diagnosis present

## 2023-12-09 LAB — RESP PANEL BY RT-PCR (RSV, FLU A&B, COVID)  RVPGX2
Influenza A by PCR: NEGATIVE
Influenza B by PCR: NEGATIVE
Resp Syncytial Virus by PCR: NEGATIVE
SARS Coronavirus 2 by RT PCR: NEGATIVE

## 2023-12-09 LAB — BASIC METABOLIC PANEL
Anion gap: 8 (ref 5–15)
BUN: 17 mg/dL (ref 6–20)
CO2: 22 mmol/L (ref 22–32)
Calcium: 8.8 mg/dL — ABNORMAL LOW (ref 8.9–10.3)
Chloride: 103 mmol/L (ref 98–111)
Creatinine, Ser: 1.12 mg/dL (ref 0.61–1.24)
GFR, Estimated: >60 mL/min (ref >60)
Glucose, Bld: 139 mg/dL — ABNORMAL HIGH (ref 70–99)
Potassium: 3.8 mmol/L (ref 3.5–5.1)
Sodium: 133 mmol/L — ABNORMAL LOW (ref 135–145)

## 2023-12-09 LAB — CBC
HCT: 36.6 % — ABNORMAL LOW (ref 39.0–52.0)
Hemoglobin: 11.8 g/dL — ABNORMAL LOW (ref 13.0–17.0)
MCH: 29.3 pg (ref 26.0–34.0)
MCHC: 32.2 g/dL (ref 30.0–36.0)
MCV: 90.8 fL (ref 80.0–100.0)
Platelets: 196 10*3/uL (ref 150–400)
RBC: 4.03 MIL/uL — ABNORMAL LOW (ref 4.22–5.81)
RDW: 14.9 % (ref 11.5–15.5)
WBC: 6.9 10*3/uL (ref 4.0–10.5)
nRBC: 0 % (ref 0.0–0.2)

## 2023-12-09 LAB — TROPONIN I (HIGH SENSITIVITY)
Troponin I (High Sensitivity): 4 ng/L (ref <18)
Troponin I (High Sensitivity): 7 ng/L (ref <18)

## 2023-12-09 LAB — LIPASE, BLOOD: Lipase: 22 U/L (ref 11–51)

## 2023-12-09 MED ORDER — ESCITALOPRAM OXALATE 10 MG PO TABS
20.0000 mg | ORAL_TABLET | Freq: Every day | ORAL | Status: DC
  Administered 2023-12-10 – 2023-12-14 (×5): 20 mg via ORAL
  Filled 2023-12-09 (×5): qty 2

## 2023-12-09 MED ORDER — FERROUS SULFATE 325 (65 FE) MG PO TABS
325.0000 mg | ORAL_TABLET | Freq: Two times a day (BID) | ORAL | Status: DC
  Administered 2023-12-10 – 2023-12-14 (×9): 325 mg via ORAL
  Filled 2023-12-09 (×9): qty 1

## 2023-12-09 MED ORDER — PANTOPRAZOLE SODIUM 40 MG PO TBEC
40.0000 mg | DELAYED_RELEASE_TABLET | Freq: Two times a day (BID) | ORAL | Status: DC
  Administered 2023-12-10 – 2023-12-14 (×9): 40 mg via ORAL
  Filled 2023-12-09 (×9): qty 1

## 2023-12-09 MED ORDER — QUETIAPINE FUMARATE 50 MG PO TABS
50.0000 mg | ORAL_TABLET | Freq: Four times a day (QID) | ORAL | Status: DC
  Administered 2023-12-10: 50 mg via ORAL
  Administered 2023-12-10: 100 mg via ORAL
  Administered 2023-12-10: 50 mg via ORAL
  Administered 2023-12-10 – 2023-12-11 (×2): 150 mg via ORAL
  Filled 2023-12-09 (×7): qty 3

## 2023-12-09 MED ORDER — CLONIDINE HCL 0.1 MG PO TABS
0.1000 mg | ORAL_TABLET | Freq: Two times a day (BID) | ORAL | Status: DC
Start: 2023-12-10 — End: 2023-12-14
  Administered 2023-12-10 – 2023-12-14 (×8): 0.1 mg via ORAL
  Filled 2023-12-09 (×9): qty 1

## 2023-12-09 MED ORDER — SODIUM CHLORIDE 0.9 % IV SOLN
500.0000 mg | INTRAVENOUS | Status: DC
  Administered 2023-12-10 – 2023-12-11 (×2): 500 mg via INTRAVENOUS
  Filled 2023-12-09 (×2): qty 5

## 2023-12-09 MED ORDER — ENOXAPARIN SODIUM 40 MG/0.4ML IJ SOSY
40.0000 mg | PREFILLED_SYRINGE | INTRAMUSCULAR | Status: DC
  Administered 2023-12-10 – 2023-12-14 (×5): 40 mg via SUBCUTANEOUS
  Filled 2023-12-09 (×5): qty 0.4

## 2023-12-09 MED ORDER — BUPRENORPHINE HCL-NALOXONE HCL 8-2 MG SL SUBL
0.5000 | SUBLINGUAL_TABLET | Freq: Every day | SUBLINGUAL | Status: DC
  Administered 2023-12-10 – 2023-12-14 (×23): 0.5 via SUBLINGUAL
  Filled 2023-12-09 (×23): qty 1

## 2023-12-09 MED ORDER — GABAPENTIN 300 MG PO CAPS
600.0000 mg | ORAL_CAPSULE | Freq: Four times a day (QID) | ORAL | Status: DC
  Administered 2023-12-10 – 2023-12-14 (×17): 600 mg via ORAL
  Filled 2023-12-09 (×17): qty 2

## 2023-12-09 MED ORDER — CEFTRIAXONE SODIUM 1 G IJ SOLR
1.0000 g | Freq: Once | INTRAMUSCULAR | Status: AC
  Administered 2023-12-09: 1 g via INTRAVENOUS
  Filled 2023-12-09: qty 10

## 2023-12-09 MED ORDER — LORATADINE 10 MG PO TABS
10.0000 mg | ORAL_TABLET | Freq: Every day | ORAL | Status: DC
  Administered 2023-12-10 – 2023-12-14 (×5): 10 mg via ORAL
  Filled 2023-12-09 (×5): qty 1

## 2023-12-09 MED ORDER — IPRATROPIUM-ALBUTEROL 0.5-2.5 (3) MG/3ML IN SOLN
9.0000 mL | Freq: Once | RESPIRATORY_TRACT | Status: AC
  Administered 2023-12-09: 9 mL via RESPIRATORY_TRACT
  Filled 2023-12-09: qty 9

## 2023-12-09 MED ORDER — SODIUM CHLORIDE 0.9 % IV SOLN
500.0000 mg | Freq: Once | INTRAVENOUS | Status: AC
  Administered 2023-12-09: 500 mg via INTRAVENOUS
  Filled 2023-12-09: qty 5

## 2023-12-09 MED ORDER — OLOPATADINE HCL 0.1 % OP SOLN
1.0000 [drp] | Freq: Every day | OPHTHALMIC | Status: DC | PRN
Start: 2023-12-09 — End: 2023-12-14

## 2023-12-09 MED ORDER — LEVALBUTEROL HCL 0.63 MG/3ML IN NEBU: 0.6300 mg | INHALATION_SOLUTION | RESPIRATORY_TRACT | Status: DC | PRN

## 2023-12-09 MED ORDER — METHYLPREDNISOLONE SODIUM SUCC 125 MG IJ SOLR
125.0000 mg | Freq: Once | INTRAMUSCULAR | Status: AC
  Administered 2023-12-09: 125 mg via INTRAVENOUS
  Filled 2023-12-09: qty 2

## 2023-12-09 MED ORDER — SODIUM CHLORIDE 0.9 % IV SOLN
2.0000 g | INTRAVENOUS | Status: DC
  Administered 2023-12-10 – 2023-12-13 (×4): 2 g via INTRAVENOUS
  Filled 2023-12-09 (×4): qty 20

## 2023-12-09 MED ORDER — ALPRAZOLAM 0.5 MG PO TABS
1.0000 mg | ORAL_TABLET | Freq: Every day | ORAL | Status: DC
  Administered 2023-12-09 – 2023-12-13 (×5): 1 mg via ORAL
  Filled 2023-12-09 (×5): qty 2

## 2023-12-09 NOTE — Assessment & Plan Note (Signed)
Pt adamantly denies any recent EtOH use or risk of withdrawal symptoms (I asked after I noticed some tremor which he attributes to the albuterol breathing treatment)..  Has been clean for "a while" he says due to pancreatitis. Pt been through withdrawal multiple times in past.

## 2023-12-09 NOTE — ED Triage Notes (Signed)
Pt reports increased SOB x 1 day and O2 sat low when he measured it at home. He has used his albuterol inhaler for his asthma which helps for only short periods. Pt speaking in full sentences without signs of distress. Pt also reports intermittent left sided chest pain x 2 weeks.

## 2023-12-09 NOTE — Assessment & Plan Note (Signed)
Patient has acute respiratory failure with hypoxia due to having a new oxygen requirement.  That is the patient has a PaO2 < 60 (pulse Ox < 90%) on room air.

## 2023-12-09 NOTE — Assessment & Plan Note (Addendum)
Cont lexapro Cont Seroquel. Cont Xanax QHS

## 2023-12-09 NOTE — Assessment & Plan Note (Signed)
Multifocal / atypical PNA Reportedly COVID negative at home, repeat test pending here. PNA pathway Check RVP Rocephin + azithromycin EDP tells me that health department just issued a notice about mycoplasma pneumonia outbreak in the local area: Covered with azithromycin empirically Adding mycoplasma IgM to work up. Cont pulse ox / O2 via [facility]

## 2023-12-09 NOTE — Assessment & Plan Note (Signed)
Continue home suboxone SL 5x daily.

## 2023-12-09 NOTE — ED Notes (Signed)
ED TO INPATIENT HANDOFF REPORT  ED Nurse Name and Phone #: Reatha Harps Name/Age/Gender Joe Lawrence 52 y.o. male Room/Bed: RESA/RESA  Code Status   Code Status: Full Code  Home/SNF/Other Home Patient oriented to: self, place, time, and situation Is this baseline? Yes   Triage Complete: Triage complete  Chief Complaint Multifocal pneumonia [J18.9]  Triage Note Pt reports increased SOB x 1 day and O2 sat low when he measured it at home. He has used his albuterol inhaler for his asthma which helps for only short periods. Pt speaking in full sentences without signs of distress. Pt also reports intermittent left sided chest pain x 2 weeks.    Allergies Allergies  Allergen Reactions   Celexa [Citalopram]     Muscle tighness   Chlorpromazine Hcl Other (See Comments)    Pt does not remember this allergy.  May be 20 years ago.   Iodine     Skin has burning sensation.   Sulfonamide Derivatives     Patient does not know of this allergy or the reaction.   Voltaren [Diclofenac Sodium]     voltaren has caused his arm to swell in the past, does not remember when this happened.    Level of Care/Admitting Diagnosis ED Disposition     ED Disposition  Admit   Condition  --   Comment  Hospital Area: [facility] [[id number]]  Level of Care: Telemetry [5]  Admit to tele based on following criteria: Complex arrhythmia (Bradycardia/Tachycardia)  May place patient in observation at [facility] or [facility] if equivalent level of care is available:: No  Covid Evaluation: Asymptomatic - no recent exposure (last 10 days) testing not required  Diagnosis: Multifocal pneumonia [[id number]]  Admitting Physician: Hillary Bow [id number]  Attending Physician: Hillary Bow [[id number]]          B Medical/Surgery History Past Medical History:  Diagnosis Date   Anxiety    Asthma    Bipolar disorder (HCC)    Bronchitis    Chronic back pain    Chronic pancreatitis  (HCC)    Depression    GERD (gastroesophageal reflux disease)    Hx of suicide attempt    x 3   Pancreatitis    PPD positive    Tobacco abuse    Past Surgical History:  Procedure Laterality Date   BACK SURGERY     internal nerve stimulator placement and removal   Epidural Steroid Injection  02/23/2011   Right Arm Surgery     Shave Right 01/08/2021   seborrheic keratosis, inflamed     A IV Location/Drains/Wounds Patient Lines/Drains/Airways Status     Active Line/Drains/Airways     Name Placement date Placement time Site Days   Peripheral IV 12/09/23 18 G Anterior;Distal;Left;Upper Arm 12/09/23  2033  Arm  less than 1   Peripheral IV 12/09/23 20 G Anterior;Proximal;Right Forearm 12/09/23  2259  Forearm  less than 1   Peripheral IV 12/09/23 20 G Distal;Left;Posterior Forearm 12/09/23  2309  Forearm  less than 1            Intake/Output Last 24 hours No intake or output data in the 24 hours ending 12/09/23 2321  Labs/Imaging Results for orders placed or performed during the hospital encounter of 12/09/23 (from the past 48 hour(s))  Basic metabolic panel     Status: Abnormal   Collection Time: 12/09/23  8:32 PM  Result Value Ref Range   Sodium 133 (L) 135 -  145 mmol/L   Potassium 3.8 3.5 - 5.1 mmol/L   Chloride 103 98 - 111 mmol/L   CO2 22 22 - 32 mmol/L   Glucose, Bld 139 (H) 70 - 99 mg/dL    Comment: Glucose reference range applies only to samples taken after fasting for at least 8 hours.   BUN 17 6 - 20 mg/dL   Creatinine, Ser 0.10 0.61 - 1.24 mg/dL   Calcium 8.8 (L) 8.9 - 10.3 mg/dL   GFR, Estimated >27 >25 mL/min    Comment: (NOTE) Calculated using the CKD-EPI Creatinine Equation (2021)    Anion gap 8 5 - 15    Comment: Performed at [facility], [address] [place], [place] [address]  CBC     Status: Abnormal   Collection Time: 12/09/23  8:32 PM  Result Value Ref Range   WBC 6.9 4.0 - 10.5 K/uL   RBC 4.03 (L) 4.22 - 5.81 MIL/uL    Hemoglobin 11.8 (L) 13.0 - 17.0 g/dL   HCT 03.4 (L) 74.2 - 59.5 %   MCV 90.8 80.0 - 100.0 fL   MCH 29.3 26.0 - 34.0 pg   MCHC 32.2 30.0 - 36.0 g/dL   RDW 63.8 75.6 - 43.3 %   Platelets 196 150 - 400 K/uL   nRBC 0.0 0.0 - 0.2 %    Comment: Performed at [facility], [address] [place], [place] [address]  Troponin I (High Sensitivity)     Status: None   Collection Time: 12/09/23  8:32 PM  Result Value Ref Range   Troponin I (High Sensitivity) 4 <18 ng/L    Comment: (NOTE) Elevated high sensitivity troponin I (hsTnI) values and significant  changes across serial measurements may suggest ACS but many other  chronic and acute conditions are known to elevate hsTnI results.  Refer to the "Links" section for chest pain algorithms and additional  guidance. Performed at [facility], [address] [place], [place] [address]   Lipase, blood     Status: None   Collection Time: 12/09/23  8:32 PM  Result Value Ref Range   Lipase 22 11 - 51 U/L    Comment: Performed at [facility], [address], [place], [place] [address]   DG Chest 2 View  Result Date: 12/09/2023 CLINICAL DATA:  Shortness of breath EXAM: CHEST - 2 VIEW COMPARISON:  10/18/2020 FINDINGS: Cardiac shadow is within normal limits. Lungs are hypoinflated. Diffuse airspace opacity is noted bilaterally consistent with multifocal infiltrate. No sizable effusion is seen. No bony abnormality is noted. IMPRESSION: Multifocal bilateral infiltrates. Electronically Signed   By: Alcide Clever M.D.   On: 12/09/2023 20:57    Pending Labs Unresulted Labs (From admission, onward)     Start     Ordered   12/10/23 0500  CBC  Tomorrow morning,   R        12/09/23 2242   12/10/23 0500  Basic metabolic panel  Tomorrow morning,   R        12/09/23 2242   12/09/23 2243  Mycoplasma pneumoniae antibody, IgM  Once,   R        12/09/23 2242   12/09/23 2240  Respiratory (~20 pathogens)  panel by PCR  (Respiratory panel by PCR (~20 pathogens, ~24 hr TAT)  w precautions)  Once,   URGENT        12/09/23 2239   12/09/23 2240  HIV Antibody (routine testing w  rflx)  (HIV Antibody (Routine testing w reflex) panel)  Once,   R        12/09/23 2242   12/09/23 2153  Blood culture (routine x 2)  BLOOD CULTURE X 2,   R (with STAT occurrences)      12/09/23 2152   12/09/23 2152  Resp panel by RT-PCR (RSV, Flu A&B, Covid) Anterior Nasal Swab  Once,   URGENT        12/09/23 2152            Vitals/Pain Today's Vitals   12/09/23 2003 12/09/23 2009 12/09/23 2012 12/09/23 2030  BP: (!) 167/85   129/84  Pulse: (!) 106   88  Resp: 20   19  Temp: 99.5 F (37.5 C)     TempSrc: Oral     SpO2: 90%  90% 93%  Weight:  90.7 kg    Height:  5\' 8"  (1.727 m)    PainSc:  2       Isolation Precautions Droplet precaution  Medications Medications  azithromycin (ZITHROMAX) 500 mg in sodium chloride 0.9 % 250 mL IVPB (500 mg Intravenous New Bag/Given 12/09/23 2313)  cefTRIAXone (ROCEPHIN) 1 g in sodium chloride 0.9 % 100 mL IVPB (1 g Intravenous New Bag/Given 12/09/23 2310)  buprenorphine-naloxone (SUBOXONE) 8-2 mg per SL tablet 0.5 tablet (has no administration in time range)  QUEtiapine (SEROQUEL) tablet 50-150 mg (has no administration in time range)  gabapentin (NEURONTIN) capsule 600 mg (has no administration in time range)  ALPRAZolam (XANAX) tablet 1 mg (1 mg Oral Given 12/09/23 2243)  cloNIDine (CATAPRES) tablet 0.1 mg (has no administration in time range)  levalbuterol (XOPENEX) nebulizer solution 0.63 mg (has no administration in time range)  escitalopram (LEXAPRO) tablet 20 mg (has no administration in time range)  pantoprazole (PROTONIX) EC tablet 40 mg (has no administration in time range)  olopatadine (PATANOL) 0.1 % ophthalmic solution 1 drop (has no administration in time range)  ferrous sulfate tablet 325 mg (has no administration in time range)  loratadine (CLARITIN)  tablet 10 mg (has no administration in time range)  enoxaparin (LOVENOX) injection 40 mg (has no administration in time range)  cefTRIAXone (ROCEPHIN) 2 g in sodium chloride 0.9 % 100 mL IVPB (has no administration in time range)  azithromycin (ZITHROMAX) 500 mg in sodium chloride 0.9 % 250 mL IVPB (has no administration in time range)  ipratropium-albuterol (DUONEB) 0.5-2.5 (3) MG/3ML nebulizer solution 9 mL (9 mLs Nebulization Given 12/09/23 2100)  methylPREDNISolone sodium succinate (SOLU-MEDROL) 125 mg/2 mL injection 125 mg (125 mg Intravenous Given 12/09/23 2100)    Mobility walks     Focused Assessments Cardiac Assessment Handoff:  Cardiac Rhythm: Normal sinus rhythm Lab Results  Component Value Date   CKTOTAL 115 01/06/2012   Lab Results  Component Value Date   DDIMER  06/07/2008    0.33        AT THE INHOUSE ESTABLISHED CUTOFF VALUE OF 0.48 ug/mL FEU, THIS ASSAY HAS BEEN DOCUMENTED IN THE LITERATURE TO HAVE   Does the Patient currently have chest pain? No    R Recommendations: See Admitting Provider Note  Report given to:   Additional Notes:

## 2023-12-09 NOTE — Assessment & Plan Note (Deleted)
Cont home Xanax at bedtime

## 2023-12-09 NOTE — H&P (Signed)
History and Physical    Patient: Joe Lawrence YQM:[id number] DOB: 1971-05-19 DOA: 12/09/2023 DOS: the patient was seen and examined on 12/09/2023 PCP: Ronnald Nian, MD  Patient coming from: Home  Chief Complaint:  Chief Complaint  Patient presents with   Shortness of Breath   HPI: Joe Lawrence is a 52 y.o. male with medical history significant of opiate abuse on suboxone, EtOH abuse in remission, recurrent EtOH pancreatitis, asthma.  Pt in to ED with c/o SOB and low O2 sat at home for past 1 day.  Also intermittent L sided CP for past 2 weeks.  No reported sick contacts at home.  Desatting to the 80s on RA at home.   Review of Systems: As mentioned in the history of present illness. All other systems reviewed and are negative. Past Medical History:  Diagnosis Date   Anxiety    Asthma    Bipolar disorder (HCC)    Bronchitis    Chronic back pain    Chronic pancreatitis (HCC)    Depression    GERD (gastroesophageal reflux disease)    Hx of suicide attempt    x 3   Pancreatitis    PPD positive    Tobacco abuse    Past Surgical History:  Procedure Laterality Date   BACK SURGERY     internal nerve stimulator placement and removal   Epidural Steroid Injection  02/23/2011   Right Arm Surgery     Shave Right 01/08/2021   seborrheic keratosis, inflamed   Social History:  reports that he has been smoking cigarettes. He has a 1 pack-year smoking history. He has never used smokeless tobacco. He reports that he does not currently use alcohol. He reports current drug use.  Allergies  Allergen Reactions   Celexa [Citalopram]     Muscle tighness   Chlorpromazine Hcl Other (See Comments)    Pt does not remember this allergy.  May be 20 years ago.   Iodine     Skin has burning sensation.   Sulfonamide Derivatives     Patient does not know of this allergy or the reaction.   Voltaren [Diclofenac Sodium]     voltaren has caused his arm to swell in the  past, does not remember when this happened.    Family History  Problem Relation Age of Onset   Alcohol abuse Brother     Prior to Admission medications   Medication Sig Start Date End Date Taking? Authorizing Provider  Buprenorphine HCl-Naloxone HCl (SUBOXONE) 8-2 MG FILM Place 0.5 each under the tongue 5 (five) times daily.    [provider]  cloNIDine (CATAPRES) 0.1 MG tablet Take 0.1 mg by mouth 2 (two) times daily. 08/19/23   [provider]  escitalopram (LEXAPRO) 20 MG tablet Take 1 tablet (20 mg total) by mouth daily. 02/25/23   Starleen Blue, NP  gabapentin (NEURONTIN) 300 MG capsule Take 2 capsules (600 mg total) by mouth 4 (four) times daily. 02/25/23   Starleen Blue, NP  levalbuterol Pauline Aus) 0.63 MG/3ML nebulizer solution Take 0.63 mg by nebulization every 4 (four) hours as needed for wheezing or shortness of breath. Patient not taking: Reported on 11/06/2023    [provider]  methocarbamol (ROBAXIN) 500 MG tablet Take 1-2 tablets (500-1,000 mg total) by mouth every 8 (eight) hours as needed for muscle spasms. 11/10/23   Joselyn Arrow, MD  olopatadine (PATADAY) 0.1 % ophthalmic solution Place 1 drop into both eyes daily as needed (itching). 02/25/23  Starleen Blue, NP  pantoprazole (PROTONIX) 40 MG tablet Take 1 tablet (40 mg total) by mouth 2 (two) times daily. 11/18/23   Ronnald Nian, MD  prazosin (MINIPRESS) 1 MG capsule Take 1 mg by mouth at bedtime. 10/30/23   [provider]  QUEtiapine (SEROQUEL) 50 MG tablet Take 1 tablet (50 mg total) by mouth 2 (two) times daily. 02/25/23   Starleen Blue, NP    Physical Exam: Vitals:   12/09/23 2003 12/09/23 2009 12/09/23 2012 12/09/23 2030  BP: (!) 167/85   129/84  Pulse: (!) 106   88  Resp: 20   19  Temp: 99.5 F (37.5 C)     TempSrc: Oral     SpO2: 90%  90% 93%  Weight:  90.7 kg    Height:  5\' 8"  (1.727 m)     Constitutional: NAD, calm, comfortable Respiratory: clear to auscultation  bilaterally, no wheezing, no crackles. Normal respiratory effort. No accessory muscle use.  Cardiovascular: Mild tachycardia Abdomen: no tenderness, no masses palpated. No hepatosplenomegaly. Bowel sounds positive.  Neurologic: CN 2-12 grossly intact. Sensation intact, DTR normal. Strength 5/5 in all 4. Hand tremor noted Psychiatric: Normal judgment and insight. Alert and oriented x 3. Normal mood.   Data Reviewed:    Labs on Admission: I have personally reviewed following labs and imaging studies  CBC: Recent Labs  Lab 12/09/23 2032  WBC 6.9  HGB 11.8*  HCT 36.6*  MCV 90.8  PLT 196   Basic Metabolic Panel: Recent Labs  Lab 12/09/23 2032  NA 133*  K 3.8  CL 103  CO2 22  GLUCOSE 139*  BUN 17  CREATININE 1.12  CALCIUM 8.8*   GFR: Estimated Creatinine Clearance: 82.4 mL/min (by C-G formula based on SCr of 1.12 mg/dL). Liver Function Tests: No results for input(s): "AST", "ALT", "ALKPHOS", "BILITOT", "PROT", "ALBUMIN" in the last 168 hours. Recent Labs  Lab 12/09/23 2032  LIPASE 22   No results for input(s): "AMMONIA" in the last 168 hours. Coagulation Profile: No results for input(s): "INR", "PROTIME" in the last 168 hours. Cardiac Enzymes: No results for input(s): "CKTOTAL", "CKMB", "CKMBINDEX", "TROPONINI" in the last 168 hours. BNP (last 3 results) No results for input(s): "PROBNP" in the last 8760 hours. HbA1C: No results for input(s): "HGBA1C" in the last 72 hours. CBG: No results for input(s): "GLUCAP" in the last 168 hours. Lipid Profile: No results for input(s): "CHOL", "HDL", "LDLCALC", "TRIG", "CHOLHDL", "LDLDIRECT" in the last 72 hours. Thyroid Function Tests: No results for input(s): "TSH", "T4TOTAL", "FREET4", "T3FREE", "THYROIDAB" in the last 72 hours. Anemia Panel: No results for input(s): "VITAMINB12", "FOLATE", "FERRITIN", "TIBC", "IRON", "RETICCTPCT" in the last 72 hours. Urine analysis:    Component Value Date/Time   COLORURINE YELLOW  10/17/2020 1949   APPEARANCEUR CLEAR 10/17/2020 1949   LABSPEC 1.015 03/12/2023 1719   PHURINE 7.0 10/17/2020 1949   GLUCOSEU NEGATIVE 10/17/2020 1949   HGBUR NEGATIVE 10/17/2020 1949   BILIRUBINUR small (A) 03/12/2023 1719   KETONESUR negative 03/12/2023 1719   KETONESUR NEGATIVE 10/17/2020 1949   PROTEINUR negative 03/12/2023 1719   PROTEINUR NEGATIVE 10/17/2020 1949   UROBILINOGEN 0.2 09/26/2012 1830   NITRITE Negative 03/12/2023 1719   NITRITE NEGATIVE 10/17/2020 1949   LEUKOCYTESUR Negative 03/12/2023 1719   LEUKOCYTESUR NEGATIVE 10/17/2020 1949    Radiological Exams on Admission: DG Chest 2 View  Result Date: 12/09/2023 CLINICAL DATA:  Shortness of breath EXAM: CHEST - 2 VIEW COMPARISON:  10/18/2020 FINDINGS: Cardiac shadow is within  normal limits. Lungs are hypoinflated. Diffuse airspace opacity is noted bilaterally consistent with multifocal infiltrate. No sizable effusion is seen. No bony abnormality is noted. IMPRESSION: Multifocal bilateral infiltrates. Electronically Signed   By: Alcide Clever M.D.   On: 12/09/2023 20:57    EKG: Independently reviewed.   Assessment and Plan: * Multifocal pneumonia Multifocal / atypical PNA Reportedly COVID negative at home, repeat test pending here. PNA pathway Check RVP Rocephin + azithromycin EDP tells me that health department just issued a notice about mycoplasma pneumonia outbreak in the local area: Covered with azithromycin empirically Adding mycoplasma IgM to work up. Cont pulse ox / O2 via [place]  Acute respiratory failure with hypoxia (HCC) Patient has acute respiratory failure with hypoxia due to having a new oxygen requirement.  That is the patient has a PaO2 < 60 (pulse Ox < 90%) on room air.  Severe major depression, single episode, without psychotic features (HCC) Cont lexapro Cont Seroquel. Cont Xanax QHS  Opioid use disorder, severe, dependence (HCC) Continue home suboxone SL 5x daily.  History of alcohol  abuse Pt adamantly denies any recent EtOH use or risk of withdrawal symptoms (I asked after I noticed some tremor which he attributes to the albuterol breathing treatment)..  Has been clean for "a while" he says due to pancreatitis. Pt been through withdrawal multiple times in past.      Advance Care Planning:   Code Status: Full Code  Consults: None  Family Communication: No family in room  Severity of Illness: The appropriate patient status for this patient is OBSERVATION. Observation status is judged to be reasonable and necessary in order to provide the required intensity of service to ensure the patient's safety. The patient's presenting symptoms, physical exam findings, and initial radiographic and laboratory data in the context of their medical condition is felt to place them at decreased risk for further clinical deterioration. Furthermore, it is anticipated that the patient will be medically stable for discharge from the hospital within 2 midnights of admission.   Author: Hillary Bow., DO 12/09/2023 10:46 PM  For on call review [email or web address].

## 2023-12-09 NOTE — ED Notes (Addendum)
8:30 PM  Patient desatting at 86% on RA. Patient placed on 2L [place] and oxygen saturation improves to 93%.   8:48 PM  Patient transported to xray.

## 2023-12-10 ENCOUNTER — Observation Stay (HOSPITAL_COMMUNITY): Payer: Medicare Other

## 2023-12-10 DIAGNOSIS — R918 Other nonspecific abnormal finding of lung field: Secondary | ICD-10-CM | POA: Diagnosis not present

## 2023-12-10 DIAGNOSIS — D72819 Decreased white blood cell count, unspecified: Secondary | ICD-10-CM | POA: Diagnosis present

## 2023-12-10 DIAGNOSIS — F322 Major depressive disorder, single episode, severe without psychotic features: Secondary | ICD-10-CM | POA: Diagnosis present

## 2023-12-10 DIAGNOSIS — R0609 Other forms of dyspnea: Secondary | ICD-10-CM | POA: Diagnosis not present

## 2023-12-10 DIAGNOSIS — J90 Pleural effusion, not elsewhere classified: Secondary | ICD-10-CM | POA: Diagnosis not present

## 2023-12-10 DIAGNOSIS — F17210 Nicotine dependence, cigarettes, uncomplicated: Secondary | ICD-10-CM | POA: Diagnosis present

## 2023-12-10 DIAGNOSIS — E871 Hypo-osmolality and hyponatremia: Secondary | ICD-10-CM | POA: Diagnosis present

## 2023-12-10 DIAGNOSIS — R0602 Shortness of breath: Secondary | ICD-10-CM | POA: Diagnosis not present

## 2023-12-10 DIAGNOSIS — J189 Pneumonia, unspecified organism: Secondary | ICD-10-CM | POA: Diagnosis present

## 2023-12-10 DIAGNOSIS — E8770 Fluid overload, unspecified: Secondary | ICD-10-CM | POA: Diagnosis present

## 2023-12-10 DIAGNOSIS — Z882 Allergy status to sulfonamides status: Secondary | ICD-10-CM | POA: Diagnosis not present

## 2023-12-10 DIAGNOSIS — E8809 Other disorders of plasma-protein metabolism, not elsewhere classified: Secondary | ICD-10-CM | POA: Diagnosis present

## 2023-12-10 DIAGNOSIS — K219 Gastro-esophageal reflux disease without esophagitis: Secondary | ICD-10-CM | POA: Diagnosis present

## 2023-12-10 DIAGNOSIS — E66811 Obesity, class 1: Secondary | ICD-10-CM | POA: Diagnosis present

## 2023-12-10 DIAGNOSIS — Z1152 Encounter for screening for COVID-19: Secondary | ICD-10-CM | POA: Diagnosis not present

## 2023-12-10 DIAGNOSIS — J9601 Acute respiratory failure with hypoxia: Secondary | ICD-10-CM | POA: Diagnosis present

## 2023-12-10 DIAGNOSIS — F1120 Opioid dependence, uncomplicated: Secondary | ICD-10-CM | POA: Diagnosis present

## 2023-12-10 DIAGNOSIS — Z79899 Other long term (current) drug therapy: Secondary | ICD-10-CM | POA: Diagnosis not present

## 2023-12-10 DIAGNOSIS — Z811 Family history of alcohol abuse and dependence: Secondary | ICD-10-CM | POA: Diagnosis not present

## 2023-12-10 DIAGNOSIS — F1011 Alcohol abuse, in remission: Secondary | ICD-10-CM | POA: Diagnosis present

## 2023-12-10 DIAGNOSIS — Z888 Allergy status to other drugs, medicaments and biological substances status: Secondary | ICD-10-CM | POA: Diagnosis not present

## 2023-12-10 DIAGNOSIS — R0989 Other specified symptoms and signs involving the circulatory and respiratory systems: Secondary | ICD-10-CM | POA: Diagnosis not present

## 2023-12-10 DIAGNOSIS — D649 Anemia, unspecified: Secondary | ICD-10-CM | POA: Diagnosis present

## 2023-12-10 DIAGNOSIS — Z9151 Personal history of suicidal behavior: Secondary | ICD-10-CM | POA: Diagnosis not present

## 2023-12-10 DIAGNOSIS — Z6830 Body mass index (BMI) 30.0-30.9, adult: Secondary | ICD-10-CM | POA: Diagnosis not present

## 2023-12-10 DIAGNOSIS — J45909 Unspecified asthma, uncomplicated: Secondary | ICD-10-CM | POA: Diagnosis present

## 2023-12-10 DIAGNOSIS — F419 Anxiety disorder, unspecified: Secondary | ICD-10-CM | POA: Diagnosis present

## 2023-12-10 LAB — BASIC METABOLIC PANEL
Anion gap: 8 (ref 5–15)
BUN: 16 mg/dL (ref 6–20)
CO2: 22 mmol/L (ref 22–32)
Calcium: 8.8 mg/dL — ABNORMAL LOW (ref 8.9–10.3)
Chloride: 101 mmol/L (ref 98–111)
Creatinine, Ser: 1.07 mg/dL (ref 0.61–1.24)
GFR, Estimated: >60 mL/min (ref >60)
Glucose, Bld: 173 mg/dL — ABNORMAL HIGH (ref 70–99)
Potassium: 4.1 mmol/L (ref 3.5–5.1)
Sodium: 131 mmol/L — ABNORMAL LOW (ref 135–145)

## 2023-12-10 LAB — CBC
HCT: 37 % — ABNORMAL LOW (ref 39.0–52.0)
Hemoglobin: 11.4 g/dL — ABNORMAL LOW (ref 13.0–17.0)
MCH: 28.9 pg (ref 26.0–34.0)
MCHC: 30.8 g/dL (ref 30.0–36.0)
MCV: 93.7 fL (ref 80.0–100.0)
Platelets: 194 10*3/uL (ref 150–400)
RBC: 3.95 MIL/uL — ABNORMAL LOW (ref 4.22–5.81)
RDW: 14.7 % (ref 11.5–15.5)
WBC: 6.9 10*3/uL (ref 4.0–10.5)
nRBC: 0 % (ref 0.0–0.2)

## 2023-12-10 LAB — RESPIRATORY PANEL BY PCR

## 2023-12-10 LAB — HIV ANTIBODY (ROUTINE TESTING W REFLEX): HIV Screen 4th Generation wRfx: NONREACTIVE

## 2023-12-10 LAB — HEPATIC FUNCTION PANEL
ALT: 13 U/L (ref 0–44)
AST: 29 U/L (ref 15–41)
Albumin: 3.5 g/dL (ref 3.5–5.0)
Alkaline Phosphatase: 51 U/L (ref 38–126)
Bilirubin, Direct: <0.1 mg/dL (ref 0.0–0.2)
Total Bilirubin: 0.4 mg/dL (ref <1.2)
Total Protein: 7.2 g/dL (ref 6.5–8.1)

## 2023-12-10 LAB — MAGNESIUM: Magnesium: 2.5 mg/dL — ABNORMAL HIGH (ref 1.7–2.4)

## 2023-12-10 LAB — PHOSPHORUS: Phosphorus: 1.9 mg/dL — ABNORMAL LOW (ref 2.5–4.6)

## 2023-12-10 MED ORDER — HYDROCOD POLI-CHLORPHE POLI ER 10-8 MG/5ML PO SUER: 5.0000 mL | Freq: Two times a day (BID) | ORAL | Status: DC | PRN

## 2023-12-10 MED ORDER — IPRATROPIUM BROMIDE 0.02 % IN SOLN
0.5000 mg | Freq: Four times a day (QID) | RESPIRATORY_TRACT | Status: DC
  Administered 2023-12-10 – 2023-12-11 (×3): 0.5 mg via RESPIRATORY_TRACT
  Filled 2023-12-10 (×4): qty 2.5

## 2023-12-10 MED ORDER — GUAIFENESIN ER 600 MG PO TB12
1200.0000 mg | ORAL_TABLET | Freq: Two times a day (BID) | ORAL | Status: DC
  Administered 2023-12-10 – 2023-12-14 (×9): 1200 mg via ORAL
  Filled 2023-12-10 (×9): qty 2

## 2023-12-10 MED ORDER — SODIUM PHOSPHATES 45 MMOLE/15ML IV SOLN
15.0000 mmol | Freq: Once | INTRAVENOUS | Status: AC
  Administered 2023-12-10: 15 mmol via INTRAVENOUS
  Filled 2023-12-10: qty 5

## 2023-12-10 MED ORDER — LEVALBUTEROL HCL 0.63 MG/3ML IN NEBU
0.6300 mg | INHALATION_SOLUTION | Freq: Four times a day (QID) | RESPIRATORY_TRACT | Status: DC
  Administered 2023-12-10 – 2023-12-11 (×3): 0.63 mg via RESPIRATORY_TRACT
  Filled 2023-12-10 (×4): qty 3

## 2023-12-10 MED ORDER — HYDROCODONE BIT-HOMATROP MBR 5-1.5 MG/5ML PO SOLN: 5.0000 mL | Freq: Four times a day (QID) | ORAL | Status: DC | PRN

## 2023-12-10 NOTE — ED Provider Notes (Signed)
[facility] Provider Note   CSN: [id number] Arrival date & time: 12/09/23  1958     History Chief Complaint  Patient presents with   Shortness of Breath    HPI Joe Lawrence is a 52 y.o. male presenting for chest pain shortness of breath.  History of smoking and asthma.   Patient's recorded medical, surgical, social, medication list and allergies were reviewed in the Snapshot window as part of the initial history.   Review of Systems   Review of Systems  Constitutional:  Negative for chills and fever.  HENT:  Negative for ear pain and sore throat.   Eyes:  Negative for pain and visual disturbance.  Respiratory:  Positive for cough, shortness of breath and wheezing. Negative for choking.   Cardiovascular:  Negative for chest pain and palpitations.  Gastrointestinal:  Negative for abdominal pain and vomiting.  Genitourinary:  Negative for dysuria and hematuria.  Musculoskeletal:  Negative for arthralgias and back pain.  Skin:  Negative for color change and rash.  Neurological:  Negative for seizures and syncope.  All other systems reviewed and are negative.   Physical Exam Updated Vital Signs BP 118/74 (BP Location: Right Arm)   Pulse (!) 102   Temp 98.4 F (36.9 C) (Oral)   Resp 18   Ht 5\' 8"  (1.727 m)   Wt 90.7 kg   SpO2 94%   BMI 30.41 kg/m  Physical Exam Vitals and nursing note reviewed.  Constitutional:      General: He is not in acute distress.    Appearance: He is well-developed.  HENT:     Head: Normocephalic and atraumatic.  Eyes:     Conjunctiva/sclera: Conjunctivae normal.  Cardiovascular:     Rate and Rhythm: Normal rate and regular rhythm.     Heart sounds: No murmur heard. Pulmonary:     Effort: Respiratory distress present.     Breath sounds: Decreased breath sounds and wheezing present.  Abdominal:     Palpations: Abdomen is soft.     Tenderness: There is no abdominal tenderness.   Musculoskeletal:        General: No swelling.     Cervical back: Neck supple.  Skin:    General: Skin is warm and dry.     Capillary Refill: Capillary refill takes less than 2 seconds.  Neurological:     Mental Status: He is alert.  Psychiatric:        Mood and Affect: Mood normal.      ED Course/ Medical Decision Making/ A&P    Procedures .Critical Care  Performed by: Glyn Ade, MD Authorized by: Glyn Ade, MD   Critical care provider statement:    Critical care time (minutes):  30   Critical care was necessary to treat or prevent imminent or life-threatening deterioration of the following conditions:  Respiratory failure   Critical care was time spent personally by me on the following activities:  Development of treatment plan with patient or surrogate, discussions with consultants, evaluation of patient's response to treatment, examination of patient, ordering and review of laboratory studies, ordering and review of radiographic studies, ordering and performing treatments and interventions, pulse oximetry, re-evaluation of patient's condition and review of old charts   Care discussed with: admitting provider      Medications Ordered in ED Medications  buprenorphine-naloxone (SUBOXONE) 8-2 mg per SL tablet 0.5 tablet (has no administration in time range)  QUEtiapine (SEROQUEL) tablet 50-150 mg (has no  administration in time range)  gabapentin (NEURONTIN) capsule 600 mg (has no administration in time range)  ALPRAZolam (XANAX) tablet 1 mg (1 mg Oral Given 12/09/23 2243)  cloNIDine (CATAPRES) tablet 0.1 mg (has no administration in time range)  levalbuterol (XOPENEX) nebulizer solution 0.63 mg (has no administration in time range)  escitalopram (LEXAPRO) tablet 20 mg (has no administration in time range)  pantoprazole (PROTONIX) EC tablet 40 mg (has no administration in time range)  olopatadine (PATANOL) 0.1 % ophthalmic solution 1 drop (has no administration  in time range)  ferrous sulfate tablet 325 mg (has no administration in time range)  loratadine (CLARITIN) tablet 10 mg (has no administration in time range)  enoxaparin (LOVENOX) injection 40 mg (has no administration in time range)  cefTRIAXone (ROCEPHIN) 2 g in sodium chloride 0.9 % 100 mL IVPB (has no administration in time range)  azithromycin (ZITHROMAX) 500 mg in sodium chloride 0.9 % 250 mL IVPB (has no administration in time range)  ipratropium-albuterol (DUONEB) 0.5-2.5 (3) MG/3ML nebulizer solution 9 mL (9 mLs Nebulization Given 12/09/23 2100)  methylPREDNISolone sodium succinate (SOLU-MEDROL) 125 mg/2 mL injection 125 mg (125 mg Intravenous Given 12/09/23 2100)  azithromycin (ZITHROMAX) 500 mg in sodium chloride 0.9 % 250 mL IVPB (500 mg Intravenous New Bag/Given 12/09/23 2313)  cefTRIAXone (ROCEPHIN) 1 g in sodium chloride 0.9 % 100 mL IVPB (0 g Intravenous Stopped 12/09/23 2345)    Medical Decision Making:   52 year old male presenting with acute hypoxic respiratory failure. New 3 L oxygen requirement.  History of asthma treated with triplicate of DuoNeb's, Solu-Medrol and only moderately improving.  X-ray was performed that demonstrated a multilobular pneumonia. Given these findings he started on antibiotics and arrange for admission for further care and management given new oxygen requirement.  Disposition:   Based on the above findings, I believe this patient is stable for admission.    Patient/family educated about specific findings on our evaluation and explained exact reasons for admission.  Patient/family educated about clinical situation and time was allowed to answer questions.   Admission team communicated with and agreed with need for admission. Patient admitted. Patient  ready to move at this time.     Emergency Department Medication Summary:   Medications  buprenorphine-naloxone (SUBOXONE) 8-2 mg per SL tablet 0.5 tablet (has no administration in time range)   QUEtiapine (SEROQUEL) tablet 50-150 mg (has no administration in time range)  gabapentin (NEURONTIN) capsule 600 mg (has no administration in time range)  ALPRAZolam (XANAX) tablet 1 mg (1 mg Oral Given 12/09/23 2243)  cloNIDine (CATAPRES) tablet 0.1 mg (has no administration in time range)  levalbuterol (XOPENEX) nebulizer solution 0.63 mg (has no administration in time range)  escitalopram (LEXAPRO) tablet 20 mg (has no administration in time range)  pantoprazole (PROTONIX) EC tablet 40 mg (has no administration in time range)  olopatadine (PATANOL) 0.1 % ophthalmic solution 1 drop (has no administration in time range)  ferrous sulfate tablet 325 mg (has no administration in time range)  loratadine (CLARITIN) tablet 10 mg (has no administration in time range)  enoxaparin (LOVENOX) injection 40 mg (has no administration in time range)  cefTRIAXone (ROCEPHIN) 2 g in sodium chloride 0.9 % 100 mL IVPB (has no administration in time range)  azithromycin (ZITHROMAX) 500 mg in sodium chloride 0.9 % 250 mL IVPB (has no administration in time range)  ipratropium-albuterol (DUONEB) 0.5-2.5 (3) MG/3ML nebulizer solution 9 mL (9 mLs Nebulization Given 12/09/23 2100)  methylPREDNISolone sodium succinate (SOLU-MEDROL) 125 mg/2 mL  injection 125 mg (125 mg Intravenous Given 12/09/23 2100)  azithromycin (ZITHROMAX) 500 mg in sodium chloride 0.9 % 250 mL IVPB (500 mg Intravenous New Bag/Given 12/09/23 2313)  cefTRIAXone (ROCEPHIN) 1 g in sodium chloride 0.9 % 100 mL IVPB (0 g Intravenous Stopped 12/09/23 2345)         Clinical Impression:  1. Shortness of breath      Admit   Final Clinical Impression(s) / ED Diagnoses Final diagnoses:  Shortness of breath    Rx / DC Orders ED Discharge Orders     None         Glyn Ade, MD 12/10/23 [phone]

## 2023-12-10 NOTE — Hospital Course (Addendum)
The patient is a 52 year old obese Caucasian male with a past medical history significant for abdominal to opioid abuse on Suboxone, alcohol abuse in remission, recurrent alcoholic pancreatitis, asthma as well as other comorbidities who presented to the hospital with shortness of breath.  Came to the ED with complaints of shortness of breath and low O2 saturations for the last day or so and had intermittent left-sided chest discomfort for last few weeks.  Had no sick contacts and was noted to be desaturating on room air to the 80s.    Subsequently was worked up and found to have a multifocal pneumonia and is on CAP coverage and have added multiple breathing treatments but given his lack of response there may be some component of heart failure so we will start diuresis and check an echocardiogram.   Was a little bit better with diuresis so we will continue with diuresis today and awaiting echocardiogram results.  Assessment and Plan:  Multifocal Pneumonia -Multifocal / Atypical PNA -Reportedly COVID negative at home, repeat test in the ED negative for influenza A/B, RSV and COVID-19 -Checking respiratory virus panel x 20 and placed on droplet precautions -Given a continuous DuoNeb 9 mL once yesterday will now place on Xopenex/Atrovent every 6 scheduled, and continue with antibiotics with Ceftriaxone and Azithromycin IV -Will place on guaifenesin 1200 mg p.o. twice daily, and order flutter valve, incentive spirometry -C/w Brovana and Budesonide  -Was Given IV Solu-Medrol 125 mg x 1 but currently not wheezing so we will hold further doses -There is a Mycoplasma pneumonia outbreak in the local area and so is being covered with azithromycin empirically and getting a mycoplasma IgM -Continue with loratadine 10 mg p.o. daily -Add Tussionex -Repeat chest x-ray done and showed "The heart size and mediastinal contours are within normal limits. Minimal interstitial densities are noted throughout both lungs  concerning for possible pneumonia or edema. The visualized skeletal structures are unremarkable." -If not improving or worsening we will obtain a CT scan of the chest and discuss with Pulmonary about further consultation but will try diuresis first given Elevated BNP and CXR findings: Currently awaiting ECHO to be done  -Ambulatory home O2 screen done and he did desaturate   Acute Respiratory Failure with Hypoxia (HCC) -In the Setting of Above with multifocal/atypical pneumonia and ? Fluid from Volume overload and Suspected CHF -Patient has acute respiratory failure with hypoxia due to having a new oxygen requirement.   -That is the patient has a PaO2 < 60 (pulse Ox < 90%) on room air. -SpO2: 90 % O2 Flow Rate (L/min): 4 L/min -BNP was 312.8 so will trial Diuresis with IV Lasix 40 mg x1 -Continuous pulse oximetry maintain O2 saturation greater than 90% -Continue supplemental oxygen nasal cannula wean O2 as tolerated -See below -Patient will need an ambulatory home O2 screen prior to discharge and repeat chest x-ray in a.m.  Suspected CHF -BNP was 312.8 and will repeat in the AM -ECHO in 2021 was normal -CXR yesterday AM done and showed "No interval improvement or worsening. Interstitial and patchy hazy opacities of the left-greater-than-right lung fields, pneumonia versus edema or combination, continue unaltered. Minimal pleural effusions." -CXR This AM done and showed "The heart size and mediastinal contours are within normal limits. Minimal interstitial densities are noted throughout both lungs concerning for possible pneumonia or edema. The visualized skeletal structures are unremarkable." -Check ECHO here and still pending to be done -Strict I's and O's and Daily Weights;  Intake/Output Summary (Last 24 hours)  at 12/12/2023 1453 Last data filed at 12/11/2023 1622 Gross per 24 hour  Intake 350 ml  Output --  Net 350 ml   -Give a dose of IV Lasix 40 mg x1 yesterday and will give  another dose this AM -Continue to Monitor for S/Sx of Volume overload and repeat CXR in the AM   Opioid use disorder, severe, dependence (HCC) -Continue home Buprenorphine-Naloxone 0.5 mg SL 5 Times Daily    History of Alcohol aAbuse -Pt adamantly denies any recent EtOH use or risk of withdrawal symptoms (Dr. Julian Reil asked after I noticed some tremor which he attributes to the albuterol breathing treatment)..   -Has been clean for "a while" he says due to pancreatitis. -Pt been through withdrawal multiple times in past. -Continue to Monitor Carefully   Anxiety, Depression and Bipolar Disorder  -Has a Hx of Suicide Attempt x3 but denies any SI, HI, or AVH -C/w Alprazolam 1 mg po at bedtime, Clonidine 0.1 mg po BID, and Queitapine 50 mg po TID and 150 mg po qHS -C/w Escitalopram 20 mg po Daily   Hyponatremia -Na+ Trend: Recent Labs  Lab 12/09/23 2032 12/10/23 0507 12/11/23 0547 12/12/23 0523  NA 133* 131* 136 136  -Continue to Monitor and Trend and Repeat CMP in the AM  Hypophosphatemia -Phos Level Trend: Recent Labs  Lab 12/10/23 0504 12/11/23 0547 12/12/23 0523  PHOS 1.9* 2.4* 3.8  -Continue To Monitor and Replete as Necessary -Repeat Phos Level in the AM  Normocytic Anemia -Had Anemia Panel done recently as an outpatient last month  -Continue with ferrous sulfate 325 mg p.o. twice daily -Hgb/Hct Trend: Recent Labs  Lab 12/09/23 2032 12/10/23 0507 12/11/23 0547 12/12/23 0523  HGB 11.8* 11.4* 11.5* 11.6*  HCT 36.6* 37.0* 37.9* 38.1*  MCV 90.8 93.7 94.0 95.5  -Continue to Monitor for S/Sx of Bleeding; No overt bleeding noted and denies any Melena and Hematochezia or Hematuria -Needs outpatient Colonoscopy given Stability of Hgb -Repeat CBC in the AM   GERD/GI Prophylaxis -C/w Pantoprazole 40 mg po BID  Hypoalbuminemia -Patient's Albumin Trend: Recent Labs  Lab 12/10/23 0504 12/11/23 0547 12/12/23 0523  ALBUMIN 3.5 3.5 3.4*  -Continue to Monitor and  Trend and repeat CMP in the AM  Class I Obesity -Complicates overall prognosis and care -Estimated body mass index is 30.41 kg/m as calculated from the following:   Height as of this encounter: 5\' 8"  (1.727 m).   Weight as of this encounter: 90.7 kg.  -Weight Loss and Dietary Counseling given

## 2023-12-10 NOTE — Plan of Care (Signed)
  Problem: Clinical Measurements: Goal: Diagnostic test results will improve Outcome: Progressing Goal: Respiratory complications will improve Outcome: Progressing Goal: Cardiovascular complication will be avoided Outcome: Progressing   Problem: Activity: Goal: Risk for activity intolerance will decrease Outcome: Progressing   Problem: Nutrition: Goal: Adequate nutrition will be maintained Outcome: Progressing   Problem: Pain Management: Goal: General experience of comfort will improve Outcome: Progressing   Problem: Safety: Goal: Ability to remain free from injury will improve Outcome: Progressing

## 2023-12-10 NOTE — Progress Notes (Addendum)
PROGRESS NOTE    Joe Lawrence  ZDG:[id number] DOB: 1971-09-30 DOA: 12/09/2023 PCP: Ronnald Nian, MD   Brief Narrative:  The patient is a 52 year old obese Caucasian male with a past medical history significant for abdominal to opioid abuse on Suboxone, alcohol abuse in remission, recurrent alcoholic pancreatitis, asthma as well as other comorbidities who presented to the hospital with shortness of breath.  Came to the ED with complaints of shortness of breath and low O2 saturations for the last day or so and had intermittent left-sided chest discomfort for last few weeks.  Had no sick contacts and was noted to be desaturating on room air to the 80s.  Subsequently was worked up and found to have a multifocal pneumonia and is on CAP coverage and have added multiple breathing treatments.  Assessment and Plan:  Multifocal Pneumonia -Multifocal / atypical PNA -Reportedly COVID negative at home, repeat test in the ED negative for influenza A/B, RSV and COVID-19 -Checking respiratory virus panel x 20 and placed on droplet precautions -Given a continuous DuoNeb 9 mL once yesterday will now place on Xopenex/Atrovent every 6 scheduled, and continue with antibiotics with ceftriaxone and azithromycin IV -Will place on guaifenesin 1200 mg p.o. twice daily, and order flutter valve, incentive spirometry -If necessary will add Brovana and budesonide -Given IV Solu-Medrol 1 two 5 mg x 1 but currently not wheezing so we will hold further doses -There is a mycoplasma pneumonia outbreak in the local area and so is being covered with azithromycin empirically and getting a mycoplasma IgM -Continue with loratadine 10 mg p.o. daily -Add Tussionex -Repeat chest x-ray done and showed "The heart size and mediastinal contours are within normal limits. Normal pulmonary vascularity. Slightly improved aeration compared to yesterday. Diffuse hazy opacities throughout both lungs are not significantly changed.  No pleural effusion or pneumothorax. No acute osseous abnormality." -If not improving or worsening we will obtain a CT scan of the chest and discuss with pulmonary about further consultation   Acute respiratory failure with hypoxia (HCC) -In the Setting of Above with multifocal/atypical pneumonia -Patient has acute respiratory failure with hypoxia due to having a new oxygen requirement.   -That is the patient has a PaO2 < 60 (pulse Ox < 90%) on room air. -SpO2: 93 % O2 Flow Rate (L/min): 4 L/min -Continuous pulse oximetry maintain O2 saturation greater than 90% -Continue supplemental oxygen nasal cannula wean O2 as tolerated -Patient will need an ambulatory home O2 screen prior to discharge and repeat chest x-ray in a.m.   Opioid use disorder, severe, dependence (HCC) -Continue home Buprenorphine-Naloxone 0.5 mg SL 5 Times Daily    History of alcohol abuse -Pt adamantly denies any recent EtOH use or risk of withdrawal symptoms (Dr. Julian Reil asked after I noticed some tremor which he attributes to the albuterol breathing treatment)..   -Has been clean for "a while" he says due to pancreatitis. -Pt been through withdrawal multiple times in past. -Continue to Monitor Carefully   Anxiety, Depression and Bipolar Disorder  -Has a Hx of Suicide Attempt x3 but denies any SI, HI, or AVH -C/w Alprazolam 1 mg po at bedtime, Clonidine 0.1 mg po BID, and Queitapine 50-150 mg po 4 times daily -C/w Escitalopram 20 mg po Daily   Hyponatremia -Na+ Trend: Recent Labs  Lab 12/09/23 2032 12/10/23 0507  NA 133* 131*  -Replete with IV NaPhos 15 mmol -Continue to Monitor and Trend and Repeat CMP in the AM  Hypophosphatemia -Phos Level Trend: Recent Labs  Lab 12/10/23 0504  PHOS 1.9*  -Replete with IV NaPhos 15 mmol -Continue To Monitor and Replete as Necessary -Repeat Phos Level in the AM  Normocytic Anemia -Had Anemia Panel done recently as an outpatient -Continue with ferrous sulfate 325  mg p.o. twice daily -Hgb/Hct Trend: Recent Labs  Lab 12/09/23 2032 12/10/23 0507  HGB 11.8* 11.4*  HCT 36.6* 37.0*  MCV 90.8 93.7  -Continue to Monitor for S/Sx of Bleeding; No overt bleeding noted and denies any Melena and Hematochezia or Hematuria -Needs outpatient Colonoscopy -Repeat CBC in the AM   GERD/GI Prophylaxis -C/w Pantoprazole 40 mg po BID  Class I Obesity -Complicates overall prognosis and care -Estimated body mass index is 30.41 kg/m as calculated from the following:   Height as of this encounter: 5\' 8"  (1.727 m).   Weight as of this encounter: 90.7 kg.  -Weight Loss and Dietary Counseling given   DVT prophylaxis: enoxaparin (LOVENOX) injection 40 mg Start: 12/10/23 1000    Code Status: Full Code Family Communication: No family currently at bedside  Disposition Plan:  Level of care: Telemetry Status is: Inpatient Remains inpatient appropriate because: Needs further clinical improvement in his Respiratory Status   Consultants:  None  Procedures:  As delineated as above  Antimicrobials:  Anti-infectives (From admission, onward)    Start     Dose/Rate Route Frequency Ordered Stop   12/10/23 2200  cefTRIAXone (ROCEPHIN) 2 g in sodium chloride 0.9 % 100 mL IVPB        2 g 200 mL/hr over 30 Minutes Intravenous Every 24 hours 12/09/23 2242 12/15/23 2159   12/10/23 2200  azithromycin (ZITHROMAX) 500 mg in sodium chloride 0.9 % 250 mL IVPB        500 mg 250 mL/hr over 60 Minutes Intravenous Every 24 hours 12/09/23 2242 12/15/23 2159   12/09/23 2200  azithromycin (ZITHROMAX) 500 mg in sodium chloride 0.9 % 250 mL IVPB        500 mg 250 mL/hr over 60 Minutes Intravenous  Once 12/09/23 2152 12/10/23 0017   12/09/23 2200  cefTRIAXone (ROCEPHIN) 1 g in sodium chloride 0.9 % 100 mL IVPB        1 g 200 mL/hr over 30 Minutes Intravenous  Once 12/09/23 2152 12/09/23 2345       Subjective: Seen and examined at bedside and states that he is not feeling much  better at all.  Continues to have chest discomfort and states that it hurts extremely with him taking a deep breath in.  No nausea or vomiting.  Feels okay.  No other concerns or complaints at this time.  Objective: Vitals:   12/10/23 0722 12/10/23 1216 12/10/23 1218 12/10/23 1228  BP: 117/82   124/70  Pulse: 71   73  Resp: 18   16  Temp: 98.1 F (36.7 C)   97.8 F (36.6 C)  TempSrc: Oral     SpO2: 92% 97% 97% 93%  Weight:      Height:        Intake/Output Summary (Last 24 hours) at 12/10/2023 1823 Last data filed at 12/10/2023 0715 Gross per 24 hour  Intake 250.03 ml  Output --  Net 250.03 ml   Filed Weights   12/09/23 2009  Weight: 90.7 kg   Examination: Physical Exam:  Constitutional: WN/WD obese Caucasian male who is in some mild respiratory distress and is anxious Respiratory: Diminished to auscultation bilaterally with coarse breath sounds and has no wheezing but does have some rhonchi  and some slight crackles.. Normal respiratory effort and patient is not tachypenic. No accessory muscle use.  Wearing supplemental oxygen nasal cannula Cardiovascular: RRR, no murmurs / rubs / gallops. S1 and S2 auscultated. No extremity edema.   Abdomen: Soft, non-tender, distended secondary to body habitus. Bowel sounds positive.  GU: Deferred. Musculoskeletal: No clubbing / cyanosis of digits/nails. No joint deformity upper and lower extremities.  Skin: No rashes, lesions, ulcers on a limited skin evaluation. No induration; Warm and dry.  Neurologic: CN 2-12 grossly intact with no focal deficits. Romberg sign and cerebellar reflexes not assessed.  Psychiatric: Normal judgment and insight. Alert and oriented x 3. Anxious mood  Data Reviewed: I have personally reviewed following labs and imaging studies  CBC: Recent Labs  Lab 12/09/23 2032 12/10/23 0507  WBC 6.9 6.9  HGB 11.8* 11.4*  HCT 36.6* 37.0*  MCV 90.8 93.7  PLT 196 194   Basic Metabolic Panel: Recent Labs  Lab  12/09/23 2032 12/10/23 0504 12/10/23 0507  NA 133*  --  131*  K 3.8  --  4.1  CL 103  --  101  CO2 22  --  22  GLUCOSE 139*  --  173*  BUN 17  --  16  CREATININE 1.12  --  1.07  CALCIUM 8.8*  --  8.8*  MG  --  2.5*  --   PHOS  --  1.9*  --    GFR: Estimated Creatinine Clearance: 86.3 mL/min (by C-G formula based on SCr of 1.07 mg/dL). Liver Function Tests: Recent Labs  Lab 12/10/23 0504  AST 29  ALT 13  ALKPHOS 51  BILITOT 0.4  PROT 7.2  ALBUMIN 3.5   Recent Labs  Lab 12/09/23 2032  LIPASE 22   No results for input(s): "AMMONIA" in the last 168 hours. Coagulation Profile: No results for input(s): "INR", "PROTIME" in the last 168 hours. Cardiac Enzymes: No results for input(s): "CKTOTAL", "CKMB", "CKMBINDEX", "TROPONINI" in the last 168 hours. BNP (last 3 results) No results for input(s): "PROBNP" in the last 8760 hours. HbA1C: No results for input(s): "HGBA1C" in the last 72 hours. CBG: No results for input(s): "GLUCAP" in the last 168 hours. Lipid Profile: No results for input(s): "CHOL", "HDL", "LDLCALC", "TRIG", "CHOLHDL", "LDLDIRECT" in the last 72 hours. Thyroid Function Tests: No results for input(s): "TSH", "T4TOTAL", "FREET4", "T3FREE", "THYROIDAB" in the last 72 hours. Anemia Panel: No results for input(s): "VITAMINB12", "FOLATE", "FERRITIN", "TIBC", "IRON", "RETICCTPCT" in the last 72 hours. Sepsis Labs: No results for input(s): "PROCALCITON", "LATICACIDVEN" in the last 168 hours.  Recent Results (from the past 240 hour(s))  Resp panel by RT-PCR (RSV, Flu A&B, Covid) Anterior Nasal Swab     Status: None   Collection Time: 12/09/23  9:52 PM   Specimen: Anterior Nasal Swab  Result Value Ref Range Status   SARS Coronavirus 2 by RT PCR NEGATIVE NEGATIVE Final    Comment: (NOTE) SARS-CoV-2 target nucleic acids are NOT DETECTED.  The SARS-CoV-2 RNA is generally detectable in upper respiratory specimens during the acute phase of infection. The  lowest concentration of SARS-CoV-2 viral copies this assay can detect is 138 copies/mL. A negative result does not preclude SARS-Cov-2 infection and should not be used as the sole basis for treatment or other patient management decisions. A negative result may occur with  improper specimen collection/handling, submission of specimen other than nasopharyngeal swab, presence of viral mutation(s) within the areas targeted by this assay, and inadequate number of viral copies(<138 copies/mL).  A negative result must be combined with clinical observations, patient history, and epidemiological information. The expected result is Negative.  Fact Sheet for Patients:  BloggerCourse.com  Fact Sheet for Healthcare Providers:  SeriousBroker.it  This test is no t yet approved or cleared by the [place] FDA and  has been authorized for detection and/or diagnosis of SARS-CoV-2 by FDA under an Emergency Use Authorization (EUA). This EUA will remain  in effect (meaning this test can be used) for the duration of the COVID-19 declaration under Section 564(b)(1) of the Act, 21 U.S.C.section 360bbb-3(b)(1), unless the authorization is terminated  or revoked sooner.       Influenza A by PCR NEGATIVE NEGATIVE Final   Influenza B by PCR NEGATIVE NEGATIVE Final    Comment: (NOTE) The Xpert Xpress SARS-CoV-2/FLU/RSV plus assay is intended as an aid in the diagnosis of influenza from Nasopharyngeal swab specimens and should not be used as a sole basis for treatment. Nasal washings and aspirates are unacceptable for Xpert Xpress SARS-CoV-2/FLU/RSV testing.  Fact Sheet for Patients: BloggerCourse.com  Fact Sheet for Healthcare Providers: SeriousBroker.it  This test is not yet approved or cleared by the [place] FDA and has been authorized for detection and/or diagnosis of SARS-CoV-2 by FDA under  an Emergency Use Authorization (EUA). This EUA will remain in effect (meaning this test can be used) for the duration of the COVID-19 declaration under Section 564(b)(1) of the Act, 21 U.S.C. section 360bbb-3(b)(1), unless the authorization is terminated or revoked.     Resp Syncytial Virus by PCR NEGATIVE NEGATIVE Final    Comment: (NOTE) Fact Sheet for Patients: BloggerCourse.com  Fact Sheet for Healthcare Providers: SeriousBroker.it  This test is not yet approved or cleared by the [place] FDA and has been authorized for detection and/or diagnosis of SARS-CoV-2 by FDA under an Emergency Use Authorization (EUA). This EUA will remain in effect (meaning this test can be used) for the duration of the COVID-19 declaration under Section 564(b)(1) of the Act, 21 U.S.C. section 360bbb-3(b)(1), unless the authorization is terminated or revoked.  Performed at [facility], [address], [place], [place] [address]   Respiratory (~20 pathogens) panel by PCR     Status: None   Collection Time: 12/09/23 10:40 PM   Specimen: Nasopharyngeal Swab; Respiratory  Result Value Ref Range Status   Adenovirus NOT DETECTED NOT DETECTED Final   Coronavirus 229E NOT DETECTED NOT DETECTED Final    Comment: (NOTE) The Coronavirus on the Respiratory Panel, DOES NOT test for the novel  Coronavirus (2019 nCoV)    Coronavirus HKU1 NOT DETECTED NOT DETECTED Final   Coronavirus NL63 NOT DETECTED NOT DETECTED Final   Coronavirus OC43 NOT DETECTED NOT DETECTED Final   Metapneumovirus NOT DETECTED NOT DETECTED Final   Rhinovirus / Enterovirus NOT DETECTED NOT DETECTED Final   Influenza A NOT DETECTED NOT DETECTED Final   Influenza B NOT DETECTED NOT DETECTED Final   Parainfluenza Virus 1 NOT DETECTED NOT DETECTED Final   Parainfluenza Virus 2 NOT DETECTED NOT DETECTED Final   Parainfluenza Virus 3 NOT DETECTED NOT DETECTED Final    Parainfluenza Virus 4 NOT DETECTED NOT DETECTED Final   Respiratory Syncytial Virus NOT DETECTED NOT DETECTED Final   Bordetella pertussis NOT DETECTED NOT DETECTED Final   Bordetella Parapertussis NOT DETECTED NOT DETECTED Final   Chlamydophila pneumoniae NOT DETECTED NOT DETECTED Final   Mycoplasma pneumoniae NOT DETECTED NOT DETECTED Final    Comment: Performed at [facility],  [address] [place], [place] [address]  Blood culture (routine x 2)     Status: None (Preliminary result)   Collection Time: 12/09/23 10:54 PM   Specimen: BLOOD RIGHT FOREARM  Result Value Ref Range Status   Specimen Description   Final    BLOOD RIGHT FOREARM Performed at [facility], [address] [place], [place] [address]    Special Requests   Final    BOTTLES DRAWN AEROBIC AND ANAEROBIC Blood Culture adequate volume Performed at [facility], [address], [place], [place] [address]    Culture   Final    NO GROWTH < 12 HOURS Performed at [facility], [address], [place], [place] [address]    Report Status PENDING  Incomplete  Blood culture (routine x 2)     Status: None (Preliminary result)   Collection Time: 12/09/23 11:06 PM   Specimen: BLOOD LEFT WRIST  Result Value Ref Range Status   Specimen Description   Final    BLOOD LEFT WRIST Performed at [facility], [address] [place], [place] [address]    Special Requests   Final    BOTTLES DRAWN AEROBIC AND ANAEROBIC Blood Culture adequate volume Performed at [facility], [address] [place], [place] [address]    Culture   Final    NO GROWTH < 12 HOURS Performed at [facility], [address], [place], [place] [address]    Report Status PENDING  Incomplete    Radiology Studies: DG CHEST PORT 1 VIEW  Result Date: 12/10/2023 CLINICAL DATA:  Shortness of breath. EXAM: PORTABLE CHEST 1 VIEW COMPARISON:  Chest x-ray from  yesterday. FINDINGS: The heart size and mediastinal contours are within normal limits. Normal pulmonary vascularity. Slightly improved aeration compared to yesterday. Diffuse hazy opacities throughout both lungs are not significantly changed. No pleural effusion or pneumothorax. No acute osseous abnormality. IMPRESSION: 1. Unchanged multifocal pulmonary infiltrates. Electronically Signed   By: Obie Dredge M.D.   On: 12/10/2023 13:09   DG Chest 2 View  Result Date: 12/09/2023 CLINICAL DATA:  Shortness of breath EXAM: CHEST - 2 VIEW COMPARISON:  10/18/2020 FINDINGS: Cardiac shadow is within normal limits. Lungs are hypoinflated. Diffuse airspace opacity is noted bilaterally consistent with multifocal infiltrate. No sizable effusion is seen. No bony abnormality is noted. IMPRESSION: Multifocal bilateral infiltrates. Electronically Signed   By: Alcide Clever M.D.   On: 12/09/2023 20:57    Scheduled Meds:  ALPRAZolam  1 mg Oral QHS   buprenorphine-naloxone  0.5 tablet Sublingual 5 X Daily   cloNIDine  0.1 mg Oral BID   enoxaparin (LOVENOX) injection  40 mg Subcutaneous Q24H   escitalopram  20 mg Oral Daily   ferrous sulfate  325 mg Oral BID WC   gabapentin  600 mg Oral QID   guaiFENesin  1,200 mg Oral BID   ipratropium  0.5 mg Nebulization Q6H   levalbuterol  0.63 mg Nebulization Q6H   loratadine  10 mg Oral Daily   pantoprazole  40 mg Oral BID   QUEtiapine  50-150 mg Oral QID   Continuous Infusions:  azithromycin     cefTRIAXone (ROCEPHIN)  IV      LOS: 0 days   Marguerita Merles, DO [facility] Available via Epic secure chat 7am-7pm After these hours, please refer to coverage provider listed on [email or web address] 12/10/2023, 6:23 PM

## 2023-12-10 NOTE — Progress Notes (Signed)
Mobility Specialist - Progress Note   12/10/23 0953  Mobility  Activity Ambulated with assistance in hallway  Level of Assistance Independent after set-up  Assistive Device None  Distance Ambulated (ft) 200 ft  Range of Motion/Exercises Active  Activity Response Tolerated well  Mobility Referral Yes  Mobility visit 1 Mobility  Mobility Specialist Start Time (ACUTE ONLY) [id number]  Mobility Specialist Stop Time (ACUTE ONLY) 0947  Mobility Specialist Time Calculation (min) (ACUTE ONLY) 9 min   Received in bed and agreed to mobility.   On Room air at rest, pt SpO2% desat to 78%. Returned pt to 4L and recovered to 90%.  During ambulation on 4L, pt desat to 84%, had some c/o SOB. Returned to room to recover.  Recovered in <2 mins at saturation of 90%.  Left in bed with all needs met.  Marilynne Halsted Mobility Specialist

## 2023-12-11 ENCOUNTER — Inpatient Hospital Stay (HOSPITAL_COMMUNITY): Payer: Medicare Other

## 2023-12-11 DIAGNOSIS — F1011 Alcohol abuse, in remission: Secondary | ICD-10-CM | POA: Diagnosis not present

## 2023-12-11 DIAGNOSIS — J189 Pneumonia, unspecified organism: Secondary | ICD-10-CM | POA: Diagnosis not present

## 2023-12-11 DIAGNOSIS — R7989 Other specified abnormal findings of blood chemistry: Secondary | ICD-10-CM

## 2023-12-11 DIAGNOSIS — F1120 Opioid dependence, uncomplicated: Secondary | ICD-10-CM | POA: Diagnosis not present

## 2023-12-11 DIAGNOSIS — J9601 Acute respiratory failure with hypoxia: Secondary | ICD-10-CM | POA: Diagnosis not present

## 2023-12-11 LAB — COMPREHENSIVE METABOLIC PANEL
ALT: 14 U/L (ref 0–44)
AST: 21 U/L (ref 15–41)
Albumin: 3.5 g/dL (ref 3.5–5.0)
Alkaline Phosphatase: 55 U/L (ref 38–126)
Anion gap: 9 (ref 5–15)
BUN: 21 mg/dL — ABNORMAL HIGH (ref 6–20)
CO2: 24 mmol/L (ref 22–32)
Calcium: 9.1 mg/dL (ref 8.9–10.3)
Chloride: 103 mmol/L (ref 98–111)
Creatinine, Ser: 1.02 mg/dL (ref 0.61–1.24)
GFR, Estimated: >60 mL/min (ref >60)
Glucose, Bld: 101 mg/dL — ABNORMAL HIGH (ref 70–99)
Potassium: 4.5 mmol/L (ref 3.5–5.1)
Sodium: 136 mmol/L (ref 135–145)
Total Bilirubin: 0.3 mg/dL (ref <1.2)
Total Protein: 7 g/dL (ref 6.5–8.1)

## 2023-12-11 LAB — CBC WITH DIFFERENTIAL/PLATELET
Abs Immature Granulocytes: 0.03 10*3/uL (ref 0.00–0.07)
Basophils Absolute: 0 10*3/uL (ref 0.0–0.1)
Basophils Relative: 0 %
Eosinophils Absolute: 0.1 10*3/uL (ref 0.0–0.5)
Eosinophils Relative: 1 %
HCT: 37.9 % — ABNORMAL LOW (ref 39.0–52.0)
Hemoglobin: 11.5 g/dL — ABNORMAL LOW (ref 13.0–17.0)
Immature Granulocytes: 0 %
Lymphocytes Relative: 15 %
Lymphs Abs: 1.1 10*3/uL (ref 0.7–4.0)
MCH: 28.5 pg (ref 26.0–34.0)
MCHC: 30.3 g/dL (ref 30.0–36.0)
MCV: 94 fL (ref 80.0–100.0)
Monocytes Absolute: 0.8 10*3/uL (ref 0.1–1.0)
Monocytes Relative: 11 %
Neutro Abs: 5.4 10*3/uL (ref 1.7–7.7)
Neutrophils Relative %: 73 %
Platelets: 217 10*3/uL (ref 150–400)
RBC: 4.03 MIL/uL — ABNORMAL LOW (ref 4.22–5.81)
RDW: 15 % (ref 11.5–15.5)
WBC: 7.4 10*3/uL (ref 4.0–10.5)
nRBC: 0 % (ref 0.0–0.2)

## 2023-12-11 LAB — PHOSPHORUS: Phosphorus: 2.4 mg/dL — ABNORMAL LOW (ref 2.5–4.6)

## 2023-12-11 LAB — MYCOPLASMA PNEUMONIAE ANTIBODY, IGM: Mycoplasma pneumo IgM: <770 U/mL (ref 0–769)

## 2023-12-11 LAB — BRAIN NATRIURETIC PEPTIDE: B Natriuretic Peptide: 312.8 pg/mL — ABNORMAL HIGH (ref 0.0–100.0)

## 2023-12-11 LAB — MAGNESIUM: Magnesium: 2.4 mg/dL (ref 1.7–2.4)

## 2023-12-11 MED ORDER — LEVALBUTEROL HCL 0.63 MG/3ML IN NEBU
0.6300 mg | INHALATION_SOLUTION | Freq: Three times a day (TID) | RESPIRATORY_TRACT | Status: DC
Start: 2023-12-11 — End: 2023-12-12
  Administered 2023-12-11 – 2023-12-12 (×2): 0.63 mg via RESPIRATORY_TRACT
  Filled 2023-12-11 (×3): qty 3

## 2023-12-11 MED ORDER — QUETIAPINE FUMARATE 25 MG PO TABS
50.0000 mg | ORAL_TABLET | Freq: Three times a day (TID) | ORAL | Status: DC
  Administered 2023-12-11 – 2023-12-14 (×8): 50 mg via ORAL
  Filled 2023-12-11 (×9): qty 2

## 2023-12-11 MED ORDER — ARFORMOTEROL TARTRATE 15 MCG/2ML IN NEBU
15.0000 ug | INHALATION_SOLUTION | Freq: Two times a day (BID) | RESPIRATORY_TRACT | Status: DC
Start: 2023-12-11 — End: 2023-12-14
  Administered 2023-12-11 – 2023-12-14 (×6): 15 ug via RESPIRATORY_TRACT
  Filled 2023-12-11 (×6): qty 2

## 2023-12-11 MED ORDER — FUROSEMIDE 10 MG/ML IJ SOLN
40.0000 mg | Freq: Once | INTRAMUSCULAR | Status: AC
  Administered 2023-12-11: 40 mg via INTRAVENOUS
  Filled 2023-12-11: qty 4

## 2023-12-11 MED ORDER — BUDESONIDE 0.25 MG/2ML IN SUSP
0.2500 mg | Freq: Two times a day (BID) | RESPIRATORY_TRACT | Status: DC
  Administered 2023-12-11 – 2023-12-14 (×6): 0.25 mg via RESPIRATORY_TRACT
  Filled 2023-12-11 (×6): qty 2

## 2023-12-11 MED ORDER — K PHOS MONO-SOD PHOS DI & MONO 155-852-130 MG PO TABS
500.0000 mg | ORAL_TABLET | Freq: Once | ORAL | Status: AC
  Administered 2023-12-11: 500 mg via ORAL
  Filled 2023-12-11: qty 2

## 2023-12-11 MED ORDER — ACETAMINOPHEN 500 MG PO TABS
1000.0000 mg | ORAL_TABLET | Freq: Four times a day (QID) | ORAL | Status: DC | PRN
  Administered 2023-12-11: 1000 mg via ORAL
  Filled 2023-12-11: qty 2

## 2023-12-11 MED ORDER — IPRATROPIUM BROMIDE 0.02 % IN SOLN
0.5000 mg | Freq: Three times a day (TID) | RESPIRATORY_TRACT | Status: DC
  Administered 2023-12-11 – 2023-12-12 (×2): 0.5 mg via RESPIRATORY_TRACT
  Filled 2023-12-11 (×3): qty 2.5

## 2023-12-11 MED ORDER — QUETIAPINE FUMARATE 25 MG PO TABS
150.0000 mg | ORAL_TABLET | Freq: Every day | ORAL | Status: DC
  Administered 2023-12-11 – 2023-12-13 (×3): 150 mg via ORAL
  Filled 2023-12-11 (×3): qty 2

## 2023-12-11 NOTE — Progress Notes (Signed)
Mobility Specialist - Progress Note   12/11/23 1319  Oxygen Therapy  SpO2 91 %  O2 Device Nasal Cannula  O2 Flow Rate (L/min) 4 L/min  Patient Activity (if Appropriate) Ambulating  Mobility  Activity Ambulated independently in hallway  Level of Assistance Independent  Assistive Device None  Distance Ambulated (ft) 500 ft  Activity Response Tolerated well  Mobility Referral Yes  Mobility visit 1 Mobility  Mobility Specialist Start Time (ACUTE ONLY) 1301  Mobility Specialist Stop Time (ACUTE ONLY) 1317  Mobility Specialist Time Calculation (min) (ACUTE ONLY) 16 min   Pt received in bed and agreeable to mobility. Pt took x1 rest break. No complaints during session. Pt to bed after session with all needs met.    Pre-mobility: 76 HR, 94% SpO2 (4L [place]) During mobility: 79 HR, 91% SpO2 (4L [place]) Post-mobility: 75 HR, 91% SPO2 (4L [place])  Chief Technology Officer

## 2023-12-11 NOTE — Plan of Care (Signed)

## 2023-12-11 NOTE — Progress Notes (Signed)
PROGRESS NOTE    Joe Lawrence  ZOX:[id number] DOB: 1971/07/29 DOA: 12/09/2023 PCP: Ronnald Nian, MD   Brief Narrative:  The patient is a 52 year old obese Caucasian male with a past medical history significant for abdominal to opioid abuse on Suboxone, alcohol abuse in remission, recurrent alcoholic pancreatitis, asthma as well as other comorbidities who presented to the hospital with shortness of breath.  Came to the ED with complaints of shortness of breath and low O2 saturations for the last day or so and had intermittent left-sided chest discomfort for last few weeks.  Had no sick contacts and was noted to be desaturating on room air to the 80s.  Subsequently was worked up and found to have a multifocal pneumonia and is on CAP coverage and have added multiple breathing treatments given his lack of response there may be some component of heart failure so we will start diuresis and check an echocardiogram.  Assessment and Plan:  Multifocal Pneumonia -Multifocal / atypical PNA -Reportedly COVID negative at home, repeat test in the ED negative for influenza A/B, RSV and COVID-19 -Checking respiratory virus panel x 20 and placed on droplet precautions -Given a continuous DuoNeb 9 mL once yesterday will now place on Xopenex/Atrovent every 6 scheduled, and continue with antibiotics with ceftriaxone and azithromycin IV -Will place on guaifenesin 1200 mg p.o. twice daily, and order flutter valve, incentive spirometry -Will add Brovana and Budesonide today  -Given IV Solu-Medrol 125 mg x 1 but currently not wheezing so we will hold further doses -There is a mycoplasma pneumonia outbreak in the local area and so is being covered with azithromycin empirically and getting a mycoplasma IgM -Continue with loratadine 10 mg p.o. daily -Add Tussionex -Repeat chest x-ray done and showed "No interval improvement or worsening. Interstitial and patchy hazy opacities of the left-greater-than-right  lung fields, pneumonia versus edema or combination, continue unaltered. Minimal pleural effusions." -If not improving or worsening we will obtain a CT scan of the chest and discuss with pulmonary about further consultation but will try diuresis first given Elevated BNP and CXR findings    Acute Respiratory Failure with Hypoxia (HCC) -In the Setting of Above with multifocal/atypical pneumonia and ? Fluid from Volume overload and Suspected CHF -Patient has acute respiratory failure with hypoxia due to having a new oxygen requirement.   -That is the patient has a PaO2 < 60 (pulse Ox < 90%) on room air. -SpO2: (!) 87 % O2 Flow Rate (L/min): 4 L/min -BNP was 312.8 so will trial Diuresis with IV Lasix 40 mg x1 -Continuous pulse oximetry maintain O2 saturation greater than 90% -Continue supplemental oxygen nasal cannula wean O2 as tolerated -See below -Patient will need an ambulatory home O2 screen prior to discharge and repeat chest x-ray in a.m.  Suspected CHF -BNP was 312.8 -ECHO in 2021 was normal -CXR this AM done and showed "No interval improvement or worsening. Interstitial and patchy hazy opacities of the left-greater-than-right lung fields, pneumonia versus edema or combination, continue unaltered. Minimal pleural effusions." -Check ECHO here -Strict I's and O's and Daily Weights; No intake or output data in the 24 hours ending 12/11/23 1614  -Give a dose of IV Lasix 40 mg x1 given suspected edema on CXR -Continue to Monitor for S/Sx of Volume overload and repeat CXR in the AM   Opioid use disorder, severe, dependence (HCC) -Continue home Buprenorphine-Naloxone 0.5 mg SL 5 Times Daily    History of alcohol abuse -Pt adamantly denies any recent EtOH  use or risk of withdrawal symptoms (Dr. Julian Reil asked after I noticed some tremor which he attributes to the albuterol breathing treatment)..   -Has been clean for "a while" he says due to pancreatitis. -Pt been through withdrawal multiple  times in past. -Continue to Monitor Carefully   Anxiety, Depression and Bipolar Disorder  -Has a Hx of Suicide Attempt x3 but denies any SI, HI, or AVH -C/w Alprazolam 1 mg po at bedtime, Clonidine 0.1 mg po BID, and Queitapine 50 mg po TID and 150 mg po qHS -C/w Escitalopram 20 mg po Daily   Hyponatremia -Na+ Trend: Recent Labs  Lab 12/09/23 2032 12/10/23 0507 12/11/23 0547  NA 133* 131* 136  -Replete with IV NaPhos 15 mmol yesterday  -Continue to Monitor and Trend and Repeat CMP in the AM  Hypophosphatemia -Phos Level Trend: Recent Labs  Lab 12/10/23 0504 12/11/23 0547  PHOS 1.9* 2.4*  -Replete with IV NaPhos 15 mmol yesterday and will replete with po K Phos Neutral 500 mg x1 -Continue To Monitor and Replete as Necessary -Repeat Phos Level in the AM  Normocytic Anemia -Had Anemia Panel done recently as an outpatient -Continue with ferrous sulfate 325 mg p.o. twice daily -Hgb/Hct Trend: Recent Labs  Lab 12/09/23 2032 12/10/23 0507 12/11/23 0547  HGB 11.8* 11.4* 11.5*  HCT 36.6* 37.0* 37.9*  MCV 90.8 93.7 94.0  -Continue to Monitor for S/Sx of Bleeding; No overt bleeding noted and denies any Melena and Hematochezia or Hematuria -Needs outpatient Colonoscopy given Stability of Hgb -Repeat CBC in the AM   GERD/GI Prophylaxis -C/w Pantoprazole 40 mg po BID  Class I Obesity -Complicates overall prognosis and care -Estimated body mass index is 30.41 kg/m as calculated from the following:   Height as of this encounter: 5\' 8"  (1.727 m).   Weight as of this encounter: 90.7 kg.  -Weight Loss and Dietary Counseling given   DVT prophylaxis: enoxaparin (LOVENOX) injection 40 mg Start: 12/10/23 1000    Code Status: Full Code Family Communication: No family currently at bedside  Disposition Plan:  Level of care: Telemetry Status is: Inpatient Remains inpatient appropriate because: Needs further clinical improvement and workup   Consultants:  None  Procedures:   ECHOCARDIOGRAM ordered  Antimicrobials:  Anti-infectives (From admission, onward)    Start     Dose/Rate Route Frequency Ordered Stop   12/10/23 2200  cefTRIAXone (ROCEPHIN) 2 g in sodium chloride 0.9 % 100 mL IVPB        2 g 200 mL/hr over 30 Minutes Intravenous Every 24 hours 12/09/23 2242 12/15/23 2159   12/10/23 2200  azithromycin (ZITHROMAX) 500 mg in sodium chloride 0.9 % 250 mL IVPB        500 mg 250 mL/hr over 60 Minutes Intravenous Every 24 hours 12/09/23 2242 12/15/23 2159   12/09/23 2200  azithromycin (ZITHROMAX) 500 mg in sodium chloride 0.9 % 250 mL IVPB        500 mg 250 mL/hr over 60 Minutes Intravenous  Once 12/09/23 2152 12/10/23 0017   12/09/23 2200  cefTRIAXone (ROCEPHIN) 1 g in sodium chloride 0.9 % 100 mL IVPB        1 g 200 mL/hr over 30 Minutes Intravenous  Once 12/09/23 2152 12/09/23 2345       Subjective: Seen and examined at bedside and states that he is not feeling much better at all and continues to have some pain on inspiration.  Continues to feel dyspneic as well.  No nausea or vomiting.  States that he slept fairly well last night.  No other concerns or complaints at this time.  Objective: Vitals:   12/11/23 0851 12/11/23 0926 12/11/23 1319 12/11/23 1554  BP:  121/75  92/62  Pulse:    71  Resp:      Temp:    98.3 F (36.8 C)  TempSrc:      SpO2: 93%  91% (!) 87%  Weight:      Height:       No intake or output data in the 24 hours ending 12/11/23 1619 Filed Weights   12/09/23 2009  Weight: 90.7 kg   Examination: Physical Exam:  Constitutional: WN/WD obese Caucasian male in no acute distress.  Calm Respiratory: Diminished to auscultation bilaterally with some coarse breath sounds and has some rhonchi some slight crackles but no appreciable wheezing or rales. Normal respiratory effort and patient is not tachypenic. No accessory muscle use.  Wearing supplemental oxygen via nasal cannula Cardiovascular: RRR, no murmurs / rubs / gallops. S1  and S2 auscultated.  No appreciable extremity edema Abdomen: Soft, non-tender, distended secondary body habitus. Bowel sounds positive.  GU: Deferred. Musculoskeletal: No clubbing / cyanosis of digits/nails. No joint deformity upper and lower extremities.  Skin: No rashes, lesions, ulcers on limited skin evaluation. No induration; Warm and dry.  Neurologic: CN 2-12 grossly intact with no focal deficits.  Romberg sign and cerebellar reflexes not assessed.  Psychiatric: Normal judgment and insight. Alert and oriented x 3. Normal mood and appropriate affect.   Data Reviewed: I have personally reviewed following labs and imaging studies  CBC: Recent Labs  Lab 12/09/23 2032 12/10/23 0507 12/11/23 0547  WBC 6.9 6.9 7.4  NEUTROABS  --   --  5.4  HGB 11.8* 11.4* 11.5*  HCT 36.6* 37.0* 37.9*  MCV 90.8 93.7 94.0  PLT 196 194 217   Basic Metabolic Panel: Recent Labs  Lab 12/09/23 2032 12/10/23 0504 12/10/23 0507 12/11/23 0547  NA 133*  --  131* 136  K 3.8  --  4.1 4.5  CL 103  --  101 103  CO2 22  --  22 24  GLUCOSE 139*  --  173* 101*  BUN 17  --  16 21*  CREATININE 1.12  --  1.07 1.02  CALCIUM 8.8*  --  8.8* 9.1  MG  --  2.5*  --  2.4  PHOS  --  1.9*  --  2.4*   GFR: Estimated Creatinine Clearance: 90.5 mL/min (by C-G formula based on SCr of 1.02 mg/dL). Liver Function Tests: Recent Labs  Lab 12/10/23 0504 12/11/23 0547  AST 29 21  ALT 13 14  ALKPHOS 51 55  BILITOT 0.4 0.3  PROT 7.2 7.0  ALBUMIN 3.5 3.5   Recent Labs  Lab 12/09/23 2032  LIPASE 22   No results for input(s): "AMMONIA" in the last 168 hours. Coagulation Profile: No results for input(s): "INR", "PROTIME" in the last 168 hours. Cardiac Enzymes: No results for input(s): "CKTOTAL", "CKMB", "CKMBINDEX", "TROPONINI" in the last 168 hours. BNP (last 3 results) No results for input(s): "PROBNP" in the last 8760 hours. HbA1C: No results for input(s): "HGBA1C" in the last 72 hours. CBG: No results for  input(s): "GLUCAP" in the last 168 hours. Lipid Profile: No results for input(s): "CHOL", "HDL", "LDLCALC", "TRIG", "CHOLHDL", "LDLDIRECT" in the last 72 hours. Thyroid Function Tests: No results for input(s): "TSH", "T4TOTAL", "FREET4", "T3FREE", "THYROIDAB" in the last 72 hours. Anemia Panel: No results for input(s): "VITAMINB12", "  FOLATE", "FERRITIN", "TIBC", "IRON", "RETICCTPCT" in the last 72 hours. Sepsis Labs: No results for input(s): "PROCALCITON", "LATICACIDVEN" in the last 168 hours.  Recent Results (from the past 240 hours)  Resp panel by RT-PCR (RSV, Flu A&B, Covid) Anterior Nasal Swab     Status: None   Collection Time: 12/09/23  9:52 PM   Specimen: Anterior Nasal Swab  Result Value Ref Range Status   SARS Coronavirus 2 by RT PCR NEGATIVE NEGATIVE Final    Comment: (NOTE) SARS-CoV-2 target nucleic acids are NOT DETECTED.  The SARS-CoV-2 RNA is generally detectable in upper respiratory specimens during the acute phase of infection. The lowest concentration of SARS-CoV-2 viral copies this assay can detect is 138 copies/mL. A negative result does not preclude SARS-Cov-2 infection and should not be used as the sole basis for treatment or other patient management decisions. A negative result may occur with  improper specimen collection/handling, submission of specimen other than nasopharyngeal swab, presence of viral mutation(s) within the areas targeted by this assay, and inadequate number of viral copies(<138 copies/mL). A negative result must be combined with clinical observations, patient history, and epidemiological information. The expected result is Negative.  Fact Sheet for Patients:  BloggerCourse.com  Fact Sheet for Healthcare Providers:  SeriousBroker.it  This test is no t yet approved or cleared by the [place] FDA and  has been authorized for detection and/or diagnosis of SARS-CoV-2 by FDA under an  Emergency Use Authorization (EUA). This EUA will remain  in effect (meaning this test can be used) for the duration of the COVID-19 declaration under Section 564(b)(1) of the Act, 21 U.S.C.section 360bbb-3(b)(1), unless the authorization is terminated  or revoked sooner.       Influenza A by PCR NEGATIVE NEGATIVE Final   Influenza B by PCR NEGATIVE NEGATIVE Final    Comment: (NOTE) The Xpert Xpress SARS-CoV-2/FLU/RSV plus assay is intended as an aid in the diagnosis of influenza from Nasopharyngeal swab specimens and should not be used as a sole basis for treatment. Nasal washings and aspirates are unacceptable for Xpert Xpress SARS-CoV-2/FLU/RSV testing.  Fact Sheet for Patients: BloggerCourse.com  Fact Sheet for Healthcare Providers: SeriousBroker.it  This test is not yet approved or cleared by the [place] FDA and has been authorized for detection and/or diagnosis of SARS-CoV-2 by FDA under an Emergency Use Authorization (EUA). This EUA will remain in effect (meaning this test can be used) for the duration of the COVID-19 declaration under Section 564(b)(1) of the Act, 21 U.S.C. section 360bbb-3(b)(1), unless the authorization is terminated or revoked.     Resp Syncytial Virus by PCR NEGATIVE NEGATIVE Final    Comment: (NOTE) Fact Sheet for Patients: BloggerCourse.com  Fact Sheet for Healthcare Providers: SeriousBroker.it  This test is not yet approved or cleared by the [place] FDA and has been authorized for detection and/or diagnosis of SARS-CoV-2 by FDA under an Emergency Use Authorization (EUA). This EUA will remain in effect (meaning this test can be used) for the duration of the COVID-19 declaration under Section 564(b)(1) of the Act, 21 U.S.C. section 360bbb-3(b)(1), unless the authorization is terminated or revoked.  Performed at [facility], [address], [place], [place] [address]   Respiratory (~20 pathogens) panel by PCR     Status: None   Collection Time: 12/09/23 10:40 PM   Specimen: Nasopharyngeal Swab; Respiratory  Result Value Ref Range Status   Adenovirus NOT DETECTED NOT DETECTED Final   Coronavirus 229E NOT DETECTED NOT DETECTED  Final    Comment: (NOTE) The Coronavirus on the Respiratory Panel, DOES NOT test for the novel  Coronavirus (2019 nCoV)    Coronavirus HKU1 NOT DETECTED NOT DETECTED Final   Coronavirus NL63 NOT DETECTED NOT DETECTED Final   Coronavirus OC43 NOT DETECTED NOT DETECTED Final   Metapneumovirus NOT DETECTED NOT DETECTED Final   Rhinovirus / Enterovirus NOT DETECTED NOT DETECTED Final   Influenza A NOT DETECTED NOT DETECTED Final   Influenza B NOT DETECTED NOT DETECTED Final   Parainfluenza Virus 1 NOT DETECTED NOT DETECTED Final   Parainfluenza Virus 2 NOT DETECTED NOT DETECTED Final   Parainfluenza Virus 3 NOT DETECTED NOT DETECTED Final   Parainfluenza Virus 4 NOT DETECTED NOT DETECTED Final   Respiratory Syncytial Virus NOT DETECTED NOT DETECTED Final   Bordetella pertussis NOT DETECTED NOT DETECTED Final   Bordetella Parapertussis NOT DETECTED NOT DETECTED Final   Chlamydophila pneumoniae NOT DETECTED NOT DETECTED Final   Mycoplasma pneumoniae NOT DETECTED NOT DETECTED Final    Comment: Performed at [facility], [address] [place], [place] [address]  Blood culture (routine x 2)     Status: None (Preliminary result)   Collection Time: 12/09/23 10:54 PM   Specimen: BLOOD RIGHT FOREARM  Result Value Ref Range Status   Specimen Description   Final    BLOOD RIGHT FOREARM Performed at [facility], [address] [place], [place] [address]    Special Requests   Final    BOTTLES DRAWN AEROBIC AND ANAEROBIC Blood Culture adequate volume Performed at [facility], [address], [place], [place] [address]    Culture    Final    NO GROWTH 1 DAY Performed at [facility], [address], [place], [place] [address]    Report Status PENDING  Incomplete  Blood culture (routine x 2)     Status: None (Preliminary result)   Collection Time: 12/09/23 11:06 PM   Specimen: BLOOD LEFT WRIST  Result Value Ref Range Status   Specimen Description   Final    BLOOD LEFT WRIST Performed at [facility], [address] [place], [place] [address]    Special Requests   Final    BOTTLES DRAWN AEROBIC AND ANAEROBIC Blood Culture adequate volume Performed at [facility], [address], [place], [place] [address]    Culture   Final    NO GROWTH 1 DAY Performed at [facility], [address], [place], [place] [address]    Report Status PENDING  Incomplete    Radiology Studies: DG CHEST PORT 1 VIEW Result Date: 12/11/2023 CLINICAL DATA:  [id number] with shortness of breath. EXAM: PORTABLE CHEST 1 VIEW COMPARISON:  Portable chest yesterday at 9:10 a.m. FINDINGS: 5:01 a.m. The cardiac size is borderline. Central vascular fullness continues to be seen. Interstitial and patchy hazy opacities of the left-greater-than-right lung fields, pneumonia versus edema or combination, continue to be noted. There is no interval improvement or worsening, no new infiltrate. Minimal pleural effusions appear similar. The mediastinal configuration is normal. No new osseous abnormality. IMPRESSION: 1. No interval improvement or worsening. 2. Interstitial and patchy hazy opacities of the left-greater-than-right lung fields, pneumonia versus edema or combination, continue unaltered. 3. Minimal pleural effusions. Electronically Signed   By: Almira Bar M.D.   On: 12/11/2023 08:04   DG CHEST PORT 1 VIEW Result Date: 12/10/2023 CLINICAL DATA:  Shortness of breath. EXAM: PORTABLE CHEST 1 VIEW  COMPARISON:  Chest x-ray from yesterday. FINDINGS: The heart size and mediastinal contours are within normal  limits. Normal pulmonary vascularity. Slightly improved aeration compared to yesterday. Diffuse hazy opacities throughout both lungs are not significantly changed. No pleural effusion or pneumothorax. No acute osseous abnormality. IMPRESSION: 1. Unchanged multifocal pulmonary infiltrates. Electronically Signed   By: Obie Dredge M.D.   On: 12/10/2023 13:09   DG Chest 2 View Result Date: 12/09/2023 CLINICAL DATA:  Shortness of breath EXAM: CHEST - 2 VIEW COMPARISON:  10/18/2020 FINDINGS: Cardiac shadow is within normal limits. Lungs are hypoinflated. Diffuse airspace opacity is noted bilaterally consistent with multifocal infiltrate. No sizable effusion is seen. No bony abnormality is noted. IMPRESSION: Multifocal bilateral infiltrates. Electronically Signed   By: Alcide Clever M.D.   On: 12/09/2023 20:57   Scheduled Meds:  ALPRAZolam  1 mg Oral QHS   arformoterol  15 mcg Nebulization BID   budesonide (PULMICORT) nebulizer solution  0.25 mg Nebulization BID   buprenorphine-naloxone  0.5 tablet Sublingual 5 X Daily   cloNIDine  0.1 mg Oral BID   enoxaparin (LOVENOX) injection  40 mg Subcutaneous Q24H   escitalopram  20 mg Oral Daily   ferrous sulfate  325 mg Oral BID WC   gabapentin  600 mg Oral QID   guaiFENesin  1,200 mg Oral BID   ipratropium  0.5 mg Nebulization TID   levalbuterol  0.63 mg Nebulization TID   loratadine  10 mg Oral Daily   pantoprazole  40 mg Oral BID   QUEtiapine  150 mg Oral QHS   QUEtiapine  50 mg Oral TID   Continuous Infusions:  azithromycin 500 mg (12/10/23 2233)   cefTRIAXone (ROCEPHIN)  IV 2 g (12/10/23 2154)    LOS: 1 day   Marguerita Merles, DO [facility] Available via Epic secure chat 7am-7pm After these hours, please refer to coverage provider listed on [email or web address] 12/11/2023, 4:19 PM

## 2023-12-12 ENCOUNTER — Inpatient Hospital Stay (HOSPITAL_COMMUNITY): Payer: Medicare Other

## 2023-12-12 DIAGNOSIS — F1011 Alcohol abuse, in remission: Secondary | ICD-10-CM | POA: Diagnosis not present

## 2023-12-12 DIAGNOSIS — J189 Pneumonia, unspecified organism: Secondary | ICD-10-CM | POA: Diagnosis not present

## 2023-12-12 DIAGNOSIS — F1120 Opioid dependence, uncomplicated: Secondary | ICD-10-CM | POA: Diagnosis not present

## 2023-12-12 DIAGNOSIS — J9601 Acute respiratory failure with hypoxia: Secondary | ICD-10-CM | POA: Diagnosis not present

## 2023-12-12 LAB — CBC WITH DIFFERENTIAL/PLATELET
Abs Immature Granulocytes: 0.02 10*3/uL (ref 0.00–0.07)
Basophils Absolute: 0 10*3/uL (ref 0.0–0.1)
Basophils Relative: 0 %
Eosinophils Absolute: 0.3 10*3/uL (ref 0.0–0.5)
Eosinophils Relative: 6 %
HCT: 38.1 % — ABNORMAL LOW (ref 39.0–52.0)
Hemoglobin: 11.6 g/dL — ABNORMAL LOW (ref 13.0–17.0)
Immature Granulocytes: 0 %
Lymphocytes Relative: 24 %
Lymphs Abs: 1.3 10*3/uL (ref 0.7–4.0)
MCH: 29.1 pg (ref 26.0–34.0)
MCHC: 30.4 g/dL (ref 30.0–36.0)
MCV: 95.5 fL (ref 80.0–100.0)
Monocytes Absolute: 0.8 10*3/uL (ref 0.1–1.0)
Monocytes Relative: 16 %
Neutro Abs: 2.8 10*3/uL (ref 1.7–7.7)
Neutrophils Relative %: 54 %
Platelets: 240 10*3/uL (ref 150–400)
RBC: 3.99 MIL/uL — ABNORMAL LOW (ref 4.22–5.81)
RDW: 14.9 % (ref 11.5–15.5)
WBC: 5.2 10*3/uL (ref 4.0–10.5)
nRBC: 0 % (ref 0.0–0.2)

## 2023-12-12 LAB — COMPREHENSIVE METABOLIC PANEL
ALT: 13 U/L (ref 0–44)
AST: 16 U/L (ref 15–41)
Albumin: 3.4 g/dL — ABNORMAL LOW (ref 3.5–5.0)
Alkaline Phosphatase: 54 U/L (ref 38–126)
Anion gap: 10 (ref 5–15)
BUN: 22 mg/dL — ABNORMAL HIGH (ref 6–20)
CO2: 23 mmol/L (ref 22–32)
Calcium: 8.6 mg/dL — ABNORMAL LOW (ref 8.9–10.3)
Chloride: 103 mmol/L (ref 98–111)
Creatinine, Ser: 1.1 mg/dL (ref 0.61–1.24)
GFR, Estimated: >60 mL/min (ref >60)
Glucose, Bld: 93 mg/dL (ref 70–99)
Potassium: 3.8 mmol/L (ref 3.5–5.1)
Sodium: 136 mmol/L (ref 135–145)
Total Bilirubin: 0.3 mg/dL (ref <1.2)
Total Protein: 6.8 g/dL (ref 6.5–8.1)

## 2023-12-12 LAB — MAGNESIUM: Magnesium: 2.3 mg/dL (ref 1.7–2.4)

## 2023-12-12 LAB — PHOSPHORUS: Phosphorus: 3.8 mg/dL (ref 2.5–4.6)

## 2023-12-12 MED ORDER — FUROSEMIDE 10 MG/ML IJ SOLN
40.0000 mg | Freq: Once | INTRAMUSCULAR | Status: AC
  Administered 2023-12-12: 40 mg via INTRAVENOUS
  Filled 2023-12-12: qty 4

## 2023-12-12 MED ORDER — LEVALBUTEROL HCL 0.63 MG/3ML IN NEBU
0.6300 mg | INHALATION_SOLUTION | Freq: Two times a day (BID) | RESPIRATORY_TRACT | Status: DC
  Administered 2023-12-12 – 2023-12-13 (×3): 0.63 mg via RESPIRATORY_TRACT
  Filled 2023-12-12 (×3): qty 3

## 2023-12-12 MED ORDER — AZITHROMYCIN 250 MG PO TABS
500.0000 mg | ORAL_TABLET | Freq: Every day | ORAL | Status: AC
  Administered 2023-12-12 – 2023-12-13 (×2): 500 mg via ORAL
  Filled 2023-12-12 (×2): qty 2

## 2023-12-12 MED ORDER — IPRATROPIUM BROMIDE 0.02 % IN SOLN
0.5000 mg | Freq: Two times a day (BID) | RESPIRATORY_TRACT | Status: DC
  Administered 2023-12-12 – 2023-12-14 (×4): 0.5 mg via RESPIRATORY_TRACT
  Filled 2023-12-12 (×4): qty 2.5

## 2023-12-12 NOTE — Plan of Care (Signed)

## 2023-12-12 NOTE — Progress Notes (Signed)
Mobility Specialist - Progress Note  ([place] 4L) Pre-mobility: 68 bpm HR, 97% SpO2 During mobility: 85 bpm HR, 87% SpO2 Post-mobility: 82 bpm HR, 95% SPO2   12/12/23 1357  Mobility  Activity Ambulated independently in hallway  Level of Assistance Independent  Assistive Device None  Distance Ambulated (ft) 1000 ft  Range of Motion/Exercises Active  Activity Response Tolerated well  Mobility Referral Yes  Mobility visit 1 Mobility  Mobility Specialist Start Time (ACUTE ONLY) 1340  Mobility Specialist Stop Time (ACUTE ONLY) 1357  Mobility Specialist Time Calculation (min) (ACUTE ONLY) 17 min   Pt was found in bed and agreeable to ambulate. SPO2 decreased to 87% 3x during session but able to increase >90% within seconds without needing to stop. At EOS returned to bed with all needs met. Call bell in reach.  Billey Chang Mobility Specialist

## 2023-12-12 NOTE — Progress Notes (Signed)
   12/12/23 1253  TOC Brief Assessment  Insurance and Status Reviewed  Patient has primary care physician Yes  Home environment has been reviewed Single family home  Prior level of function: Independent  Prior/Current Home Services No current home services  Social Drivers of Health Review SDOH reviewed no interventions necessary  Readmission risk has been reviewed Yes  Transition of care needs transition of care needs identified, TOC will continue to follow

## 2023-12-12 NOTE — Progress Notes (Signed)
PROGRESS NOTE    Joe Lawrence  ZOX:[id number] DOB: May 27, 1971 DOA: 12/09/2023 PCP: Ronnald Nian, MD   Brief Narrative:  The patient is a 52 year old obese Caucasian male with a past medical history significant for abdominal to opioid abuse on Suboxone, alcohol abuse in remission, recurrent alcoholic pancreatitis, asthma as well as other comorbidities who presented to the hospital with shortness of breath.  Came to the ED with complaints of shortness of breath and low O2 saturations for the last day or so and had intermittent left-sided chest discomfort for last few weeks.  Had no sick contacts and was noted to be desaturating on room air to the 80s.    Subsequently was worked up and found to have a multifocal pneumonia and is on CAP coverage and have added multiple breathing treatments but given his lack of response there may be some component of heart failure so we will start diuresis and check an echocardiogram.   Was a little bit better with diuresis so we will continue with diuresis today and awaiting echocardiogram results.  Assessment and Plan:  Multifocal Pneumonia -Multifocal / Atypical PNA -Reportedly COVID negative at home, repeat test in the ED negative for influenza A/B, RSV and COVID-19 -Checking respiratory virus panel x 20 and placed on droplet precautions -Given a continuous DuoNeb 9 mL once yesterday will now place on Xopenex/Atrovent every 6 scheduled, and continue with antibiotics with Ceftriaxone and Azithromycin IV -Will place on guaifenesin 1200 mg p.o. twice daily, and order flutter valve, incentive spirometry -C/w Brovana and Budesonide  -Was Given IV Solu-Medrol 125 mg x 1 but currently not wheezing so we will hold further doses -There is a Mycoplasma pneumonia outbreak in the local area and so is being covered with azithromycin empirically and getting a mycoplasma IgM -Continue with loratadine 10 mg p.o. daily -Add Tussionex -Repeat chest x-ray done  and showed "The heart size and mediastinal contours are within normal limits. Minimal interstitial densities are noted throughout both lungs concerning for possible pneumonia or edema. The visualized skeletal structures are unremarkable." -If not improving or worsening we will obtain a CT scan of the chest and discuss with Pulmonary about further consultation but will try diuresis first given Elevated BNP and CXR findings: Currently awaiting ECHO to be done  -Ambulatory home O2 screen done and he did desaturate   Acute Respiratory Failure with Hypoxia (HCC) -In the Setting of Above with multifocal/atypical pneumonia and ? Fluid from Volume overload and Suspected CHF -Patient has acute respiratory failure with hypoxia due to having a new oxygen requirement.   -That is the patient has a PaO2 < 60 (pulse Ox < 90%) on room air. -SpO2: 90 % O2 Flow Rate (L/min): 4 L/min -BNP was 312.8 so will trial Diuresis with IV Lasix 40 mg x1 -Continuous pulse oximetry maintain O2 saturation greater than 90% -Continue supplemental oxygen nasal cannula wean O2 as tolerated -See below -Patient will need an ambulatory home O2 screen prior to discharge and repeat chest x-ray in a.m.  Suspected CHF -BNP was 312.8 and will repeat in the AM -ECHO in 2021 was normal -CXR yesterday AM done and showed "No interval improvement or worsening. Interstitial and patchy hazy opacities of the left-greater-than-right lung fields, pneumonia versus edema or combination, continue unaltered. Minimal pleural effusions." -CXR This AM done and showed "The heart size and mediastinal contours are within normal limits. Minimal interstitial densities are noted throughout both lungs concerning for possible pneumonia or edema. The visualized skeletal structures  are unremarkable." -Check ECHO here and still pending to be done -Strict I's and O's and Daily Weights;  Intake/Output Summary (Last 24 hours) at 12/12/2023 1453 Last data filed at  12/11/2023 1622 Gross per 24 hour  Intake 350 ml  Output --  Net 350 ml   -Give a dose of IV Lasix 40 mg x1 yesterday and will give another dose this AM -Continue to Monitor for S/Sx of Volume overload and repeat CXR in the AM   Opioid use disorder, severe, dependence (HCC) -Continue home Buprenorphine-Naloxone 0.5 mg SL 5 Times Daily    History of Alcohol aAbuse -Pt adamantly denies any recent EtOH use or risk of withdrawal symptoms (Dr. Julian Reil asked after I noticed some tremor which he attributes to the albuterol breathing treatment)..   -Has been clean for "a while" he says due to pancreatitis. -Pt been through withdrawal multiple times in past. -Continue to Monitor Carefully   Anxiety, Depression and Bipolar Disorder  -Has a Hx of Suicide Attempt x3 but denies any SI, HI, or AVH -C/w Alprazolam 1 mg po at bedtime, Clonidine 0.1 mg po BID, and Queitapine 50 mg po TID and 150 mg po qHS -C/w Escitalopram 20 mg po Daily   Hyponatremia -Na+ Trend: Recent Labs  Lab 12/09/23 2032 12/10/23 0507 12/11/23 0547 12/12/23 0523  NA 133* 131* 136 136  -Continue to Monitor and Trend and Repeat CMP in the AM  Hypophosphatemia -Phos Level Trend: Recent Labs  Lab 12/10/23 0504 12/11/23 0547 12/12/23 0523  PHOS 1.9* 2.4* 3.8  -Continue To Monitor and Replete as Necessary -Repeat Phos Level in the AM  Normocytic Anemia -Had Anemia Panel done recently as an outpatient last month  -Continue with ferrous sulfate 325 mg p.o. twice daily -Hgb/Hct Trend: Recent Labs  Lab 12/09/23 2032 12/10/23 0507 12/11/23 0547 12/12/23 0523  HGB 11.8* 11.4* 11.5* 11.6*  HCT 36.6* 37.0* 37.9* 38.1*  MCV 90.8 93.7 94.0 95.5  -Continue to Monitor for S/Sx of Bleeding; No overt bleeding noted and denies any Melena and Hematochezia or Hematuria -Needs outpatient Colonoscopy given Stability of Hgb -Repeat CBC in the AM   GERD/GI Prophylaxis -C/w Pantoprazole 40 mg po  BID  Hypoalbuminemia -Patient's Albumin Trend: Recent Labs  Lab 12/10/23 0504 12/11/23 0547 12/12/23 0523  ALBUMIN 3.5 3.5 3.4*  -Continue to Monitor and Trend and repeat CMP in the AM  Class I Obesity -Complicates overall prognosis and care -Estimated body mass index is 30.41 kg/m as calculated from the following:   Height as of this encounter: 5\' 8"  (1.727 m).   Weight as of this encounter: 90.7 kg.  -Weight Loss and Dietary Counseling given   DVT prophylaxis: enoxaparin (LOVENOX) injection 40 mg Start: 12/10/23 1000    Code Status: Full Code Family Communication: No family present at bedside  Disposition Plan:  Level of care: Telemetry Status is: Inpatient Remains inpatient appropriate because: His further clinical improvement in his respiratory status   Consultants:  None  Procedures:  ECHOCARDIOGRAM ordered   Antimicrobials:  Anti-infectives (From admission, onward)    Start     Dose/Rate Route Frequency Ordered Stop   12/12/23 2200  azithromycin (ZITHROMAX) tablet 500 mg        500 mg Oral Daily at bedtime 12/12/23 1220 12/14/23 2159   12/10/23 2200  cefTRIAXone (ROCEPHIN) 2 g in sodium chloride 0.9 % 100 mL IVPB        2 g 200 mL/hr over 30 Minutes Intravenous Every 24 hours 12/09/23  2242 12/15/23 2159   12/10/23 2200  azithromycin (ZITHROMAX) 500 mg in sodium chloride 0.9 % 250 mL IVPB  Status:  Discontinued        500 mg 250 mL/hr over 60 Minutes Intravenous Every 24 hours 12/09/23 2242 12/12/23 1220   12/09/23 2200  azithromycin (ZITHROMAX) 500 mg in sodium chloride 0.9 % 250 mL IVPB        500 mg 250 mL/hr over 60 Minutes Intravenous  Once 12/09/23 2152 12/10/23 0017   12/09/23 2200  cefTRIAXone (ROCEPHIN) 1 g in sodium chloride 0.9 % 100 mL IVPB        1 g 200 mL/hr over 30 Minutes Intravenous  Once 12/09/23 2152 12/09/23 2345       Subjective: Seen and examined at bedside and thinks he is doing a little bit better compared to yesterday.  Does not  have as much pain on inspiration.  Continues to feel dyspneic but feels better than yesterday.  No nausea or vomiting.  Denies any other concerns or complaints at this time.  Objective: Vitals:   12/11/23 2019 12/12/23 0445 12/12/23 0958 12/12/23 1320  BP: 107/66 122/80 122/80 94/61  Pulse: 80 66    Resp: 18 15  16   Temp: 98.6 F (37 C) 97.8 F (36.6 C)  98 F (36.7 C)  TempSrc:      SpO2: 95% 90%    Weight:      Height:        Intake/Output Summary (Last 24 hours) at 12/12/2023 1453 Last data filed at 12/11/2023 1622 Gross per 24 hour  Intake 350 ml  Output --  Net 350 ml   Filed Weights   12/09/23 2009  Weight: 90.7 kg   Examination: Physical Exam:  Constitutional: WN/WD obese Caucasian male in no acute distress appears calm Respiratory: Diminished to auscultation bilaterally with some coarse breath sounds does have some slight crackles and rhonchi but no appreciable wheezing or rales. Normal respiratory effort and patient is not tachypenic. No accessory muscle use.  Unlabored breathing but is wearing supplemental oxygen nasal cannula Cardiovascular: RRR, no murmurs / rubs / gallops. S1 and S2 auscultated.  Minimal edema Abdomen: Soft, non-tender, distended secondary to body habitus. Bowel sounds positive.  GU: Deferred. Musculoskeletal: No clubbing / cyanosis of digits/nails. No joint deformity upper and lower extremities.  Skin: No rashes, lesions, ulcers. No induration; Warm and dry.  Neurologic: CN 2-12 grossly intact with no focal deficits. Romberg sign and cerebellar reflexes not assessed.  Psychiatric: Normal judgment and insight. Alert and oriented x 3. Normal mood and appropriate affect.   Data Reviewed: I have personally reviewed following labs and imaging studies  CBC: Recent Labs  Lab 12/09/23 2032 12/10/23 0507 12/11/23 0547 12/12/23 0523  WBC 6.9 6.9 7.4 5.2  NEUTROABS  --   --  5.4 2.8  HGB 11.8* 11.4* 11.5* 11.6*  HCT 36.6* 37.0* 37.9* 38.1*   MCV 90.8 93.7 94.0 95.5  PLT 196 194 217 240   Basic Metabolic Panel: Recent Labs  Lab 12/09/23 2032 12/10/23 0504 12/10/23 0507 12/11/23 0547 12/12/23 0523  NA 133*  --  131* 136 136  K 3.8  --  4.1 4.5 3.8  CL 103  --  101 103 103  CO2 22  --  22 24 23   GLUCOSE 139*  --  173* 101* 93  BUN 17  --  16 21* 22*  CREATININE 1.12  --  1.07 1.02 1.10  CALCIUM 8.8*  --  8.8* 9.1 8.6*  MG  --  2.5*  --  2.4 2.3  PHOS  --  1.9*  --  2.4* 3.8   GFR: Estimated Creatinine Clearance: 83.9 mL/min (by C-G formula based on SCr of 1.1 mg/dL). Liver Function Tests: Recent Labs  Lab 12/10/23 0504 12/11/23 0547 12/12/23 0523  AST 29 21 16   ALT 13 14 13   ALKPHOS 51 55 54  BILITOT 0.4 0.3 0.3  PROT 7.2 7.0 6.8  ALBUMIN 3.5 3.5 3.4*   Recent Labs  Lab 12/09/23 2032  LIPASE 22   No results for input(s): "AMMONIA" in the last 168 hours. Coagulation Profile: No results for input(s): "INR", "PROTIME" in the last 168 hours. Cardiac Enzymes: No results for input(s): "CKTOTAL", "CKMB", "CKMBINDEX", "TROPONINI" in the last 168 hours. BNP (last 3 results) No results for input(s): "PROBNP" in the last 8760 hours. HbA1C: No results for input(s): "HGBA1C" in the last 72 hours. CBG: No results for input(s): "GLUCAP" in the last 168 hours. Lipid Profile: No results for input(s): "CHOL", "HDL", "LDLCALC", "TRIG", "CHOLHDL", "LDLDIRECT" in the last 72 hours. Thyroid Function Tests: No results for input(s): "TSH", "T4TOTAL", "FREET4", "T3FREE", "THYROIDAB" in the last 72 hours. Anemia Panel: No results for input(s): "VITAMINB12", "FOLATE", "FERRITIN", "TIBC", "IRON", "RETICCTPCT" in the last 72 hours. Sepsis Labs: No results for input(s): "PROCALCITON", "LATICACIDVEN" in the last 168 hours.  Recent Results (from the past 240 hours)  Resp panel by RT-PCR (RSV, Flu A&B, Covid) Anterior Nasal Swab     Status: None   Collection Time: 12/09/23  9:52 PM   Specimen: Anterior Nasal Swab  Result  Value Ref Range Status   SARS Coronavirus 2 by RT PCR NEGATIVE NEGATIVE Final    Comment: (NOTE) SARS-CoV-2 target nucleic acids are NOT DETECTED.  The SARS-CoV-2 RNA is generally detectable in upper respiratory specimens during the acute phase of infection. The lowest concentration of SARS-CoV-2 viral copies this assay can detect is 138 copies/mL. A negative result does not preclude SARS-Cov-2 infection and should not be used as the sole basis for treatment or other patient management decisions. A negative result may occur with  improper specimen collection/handling, submission of specimen other than nasopharyngeal swab, presence of viral mutation(s) within the areas targeted by this assay, and inadequate number of viral copies(<138 copies/mL). A negative result must be combined with clinical observations, patient history, and epidemiological information. The expected result is Negative.  Fact Sheet for Patients:  BloggerCourse.com  Fact Sheet for Healthcare Providers:  SeriousBroker.it  This test is no t yet approved or cleared by the [place] FDA and  has been authorized for detection and/or diagnosis of SARS-CoV-2 by FDA under an Emergency Use Authorization (EUA). This EUA will remain  in effect (meaning this test can be used) for the duration of the COVID-19 declaration under Section 564(b)(1) of the Act, 21 U.S.C.section 360bbb-3(b)(1), unless the authorization is terminated  or revoked sooner.       Influenza A by PCR NEGATIVE NEGATIVE Final   Influenza B by PCR NEGATIVE NEGATIVE Final    Comment: (NOTE) The Xpert Xpress SARS-CoV-2/FLU/RSV plus assay is intended as an aid in the diagnosis of influenza from Nasopharyngeal swab specimens and should not be used as a sole basis for treatment. Nasal washings and aspirates are unacceptable for Xpert Xpress SARS-CoV-2/FLU/RSV testing.  Fact Sheet for  Patients: BloggerCourse.com  Fact Sheet for Healthcare Providers: SeriousBroker.it  This test is not yet approved or cleared by the [place] and  has been authorized for detection and/or diagnosis of SARS-CoV-2 by FDA under an Emergency Use Authorization (EUA). This EUA will remain in effect (meaning this test can be used) for the duration of the COVID-19 declaration under Section 564(b)(1) of the Act, 21 U.S.C. section 360bbb-3(b)(1), unless the authorization is terminated or revoked.     Resp Syncytial Virus by PCR NEGATIVE NEGATIVE Final    Comment: (NOTE) Fact Sheet for Patients: BloggerCourse.com  Fact Sheet for Healthcare Providers: SeriousBroker.it  This test is not yet approved or cleared by the [place] FDA and has been authorized for detection and/or diagnosis of SARS-CoV-2 by FDA under an Emergency Use Authorization (EUA). This EUA will remain in effect (meaning this test can be used) for the duration of the COVID-19 declaration under Section 564(b)(1) of the Act, 21 U.S.C. section 360bbb-3(b)(1), unless the authorization is terminated or revoked.  Performed at [facility], [address] [place], [place] [address]   Respiratory (~20 pathogens) panel by PCR     Status: None   Collection Time: 12/09/23 10:40 PM   Specimen: Nasopharyngeal Swab; Respiratory  Result Value Ref Range Status   Adenovirus NOT DETECTED NOT DETECTED Final   Coronavirus 229E NOT DETECTED NOT DETECTED Final    Comment: (NOTE) The Coronavirus on the Respiratory Panel, DOES NOT test for the novel  Coronavirus (2019 nCoV)    Coronavirus HKU1 NOT DETECTED NOT DETECTED Final   Coronavirus NL63 NOT DETECTED NOT DETECTED Final   Coronavirus OC43 NOT DETECTED NOT DETECTED Final   Metapneumovirus NOT DETECTED NOT DETECTED Final   Rhinovirus / Enterovirus NOT  DETECTED NOT DETECTED Final   Influenza A NOT DETECTED NOT DETECTED Final   Influenza B NOT DETECTED NOT DETECTED Final   Parainfluenza Virus 1 NOT DETECTED NOT DETECTED Final   Parainfluenza Virus 2 NOT DETECTED NOT DETECTED Final   Parainfluenza Virus 3 NOT DETECTED NOT DETECTED Final   Parainfluenza Virus 4 NOT DETECTED NOT DETECTED Final   Respiratory Syncytial Virus NOT DETECTED NOT DETECTED Final   Bordetella pertussis NOT DETECTED NOT DETECTED Final   Bordetella Parapertussis NOT DETECTED NOT DETECTED Final   Chlamydophila pneumoniae NOT DETECTED NOT DETECTED Final   Mycoplasma pneumoniae NOT DETECTED NOT DETECTED Final    Comment: Performed at [facility], [address], [place], [place] [address]  Blood culture (routine x 2)     Status: None (Preliminary result)   Collection Time: 12/09/23 10:54 PM   Specimen: BLOOD RIGHT FOREARM  Result Value Ref Range Status   Specimen Description   Final    BLOOD RIGHT FOREARM Performed at [facility], [address] [place], [place] [address]    Special Requests   Final    BOTTLES DRAWN AEROBIC AND ANAEROBIC Blood Culture adequate volume Performed at [facility], [address] [place], [place] [address]    Culture   Final    NO GROWTH 2 DAYS Performed at [facility], [address], [place], [place] [address]    Report Status PENDING  Incomplete  Blood culture (routine x 2)     Status: None (Preliminary result)   Collection Time: 12/09/23 11:06 PM   Specimen: BLOOD LEFT WRIST  Result Value Ref Range Status   Specimen Description   Final    BLOOD LEFT WRIST Performed at [facility], [address] [place], [place] [address]    Special Requests   Final  BOTTLES DRAWN AEROBIC AND ANAEROBIC Blood Culture adequate volume Performed at [facility], [address], [place], [place] [address]    Culture   Final    NO GROWTH 2  DAYS Performed at [facility], [address] [place], [place] [address]    Report Status PENDING  Incomplete    Radiology Studies: DG CHEST PORT 1 VIEW Result Date: 12/12/2023 CLINICAL DATA:  Shortness of breath. EXAM: PORTABLE CHEST 1 VIEW COMPARISON:  December 11, 2023. FINDINGS: The heart size and mediastinal contours are within normal limits. Minimal interstitial densities are noted throughout both lungs concerning for possible pneumonia or edema. The visualized skeletal structures are unremarkable. IMPRESSION: Minimal bilateral interstitial densities as noted above. Electronically Signed   By: Lupita Raider M.D.   On: 12/12/2023 08:27   DG CHEST PORT 1 VIEW Result Date: 12/11/2023 CLINICAL DATA:  [id number] with shortness of breath. EXAM: PORTABLE CHEST 1 VIEW COMPARISON:  Portable chest yesterday at 9:10 a.m. FINDINGS: 5:01 a.m. The cardiac size is borderline. Central vascular fullness continues to be seen. Interstitial and patchy hazy opacities of the left-greater-than-right lung fields, pneumonia versus edema or combination, continue to be noted. There is no interval improvement or worsening, no new infiltrate. Minimal pleural effusions appear similar. The mediastinal configuration is normal. No new osseous abnormality. IMPRESSION: 1. No interval improvement or worsening. 2. Interstitial and patchy hazy opacities of the left-greater-than-right lung fields, pneumonia versus edema or combination, continue unaltered. 3. Minimal pleural effusions. Electronically Signed   By: Almira Bar M.D.   On: 12/11/2023 08:04   Scheduled Meds:  ALPRAZolam  1 mg Oral QHS   arformoterol  15 mcg Nebulization BID   azithromycin  500 mg Oral QHS   budesonide (PULMICORT) nebulizer solution  0.25 mg Nebulization BID   buprenorphine-naloxone  0.5 tablet Sublingual 5 X Daily   cloNIDine  0.1 mg Oral BID   enoxaparin (LOVENOX) injection  40 mg Subcutaneous Q24H   escitalopram  20 mg Oral Daily    ferrous sulfate  325 mg Oral BID WC   gabapentin  600 mg Oral QID   guaiFENesin  1,200 mg Oral BID   ipratropium  0.5 mg Nebulization BID   levalbuterol  0.63 mg Nebulization BID   loratadine  10 mg Oral Daily   pantoprazole  40 mg Oral BID   QUEtiapine  150 mg Oral QHS   QUEtiapine  50 mg Oral TID   Continuous Infusions:  cefTRIAXone (ROCEPHIN)  IV 2 g (12/11/23 2253)    LOS: 2 days   Marguerita Merles, DO [facility] Available via Epic secure chat 7am-7pm After these hours, please refer to coverage provider listed on [email or web address] 12/12/2023, 2:53 PM

## 2023-12-13 ENCOUNTER — Inpatient Hospital Stay (HOSPITAL_COMMUNITY): Payer: Medicare Other

## 2023-12-13 DIAGNOSIS — F1011 Alcohol abuse, in remission: Secondary | ICD-10-CM | POA: Diagnosis not present

## 2023-12-13 DIAGNOSIS — J189 Pneumonia, unspecified organism: Secondary | ICD-10-CM | POA: Diagnosis not present

## 2023-12-13 DIAGNOSIS — R0609 Other forms of dyspnea: Secondary | ICD-10-CM | POA: Diagnosis not present

## 2023-12-13 DIAGNOSIS — F1120 Opioid dependence, uncomplicated: Secondary | ICD-10-CM | POA: Diagnosis not present

## 2023-12-13 DIAGNOSIS — J9601 Acute respiratory failure with hypoxia: Secondary | ICD-10-CM | POA: Diagnosis not present

## 2023-12-13 LAB — CBC WITH DIFFERENTIAL/PLATELET
Abs Immature Granulocytes: 0.01 10*3/uL (ref 0.00–0.07)
Basophils Absolute: 0 10*3/uL (ref 0.0–0.1)
Basophils Relative: 1 %
Eosinophils Absolute: 0.3 10*3/uL (ref 0.0–0.5)
Eosinophils Relative: 9 %
HCT: 36.5 % — ABNORMAL LOW (ref 39.0–52.0)
Hemoglobin: 11.5 g/dL — ABNORMAL LOW (ref 13.0–17.0)
Immature Granulocytes: 0 %
Lymphocytes Relative: 31 %
Lymphs Abs: 1.2 10*3/uL (ref 0.7–4.0)
MCH: 29.3 pg (ref 26.0–34.0)
MCHC: 31.5 g/dL (ref 30.0–36.0)
MCV: 92.9 fL (ref 80.0–100.0)
Monocytes Absolute: 0.6 10*3/uL (ref 0.1–1.0)
Monocytes Relative: 15 %
Neutro Abs: 1.7 10*3/uL (ref 1.7–7.7)
Neutrophils Relative %: 44 %
Platelets: 242 10*3/uL (ref 150–400)
RBC: 3.93 MIL/uL — ABNORMAL LOW (ref 4.22–5.81)
RDW: 14.4 % (ref 11.5–15.5)
WBC: 3.9 10*3/uL — ABNORMAL LOW (ref 4.0–10.5)
nRBC: 0 % (ref 0.0–0.2)

## 2023-12-13 LAB — PHOSPHORUS: Phosphorus: 4 mg/dL (ref 2.5–4.6)

## 2023-12-13 LAB — COMPREHENSIVE METABOLIC PANEL
ALT: 13 U/L (ref 0–44)
AST: 15 U/L (ref 15–41)
Albumin: 3.3 g/dL — ABNORMAL LOW (ref 3.5–5.0)
Alkaline Phosphatase: 59 U/L (ref 38–126)
Anion gap: 9 (ref 5–15)
BUN: 19 mg/dL (ref 6–20)
CO2: 21 mmol/L — ABNORMAL LOW (ref 22–32)
Calcium: 8.8 mg/dL — ABNORMAL LOW (ref 8.9–10.3)
Chloride: 103 mmol/L (ref 98–111)
Creatinine, Ser: 0.85 mg/dL (ref 0.61–1.24)
GFR, Estimated: >60 mL/min (ref >60)
Glucose, Bld: 106 mg/dL — ABNORMAL HIGH (ref 70–99)
Potassium: 3.8 mmol/L (ref 3.5–5.1)
Sodium: 133 mmol/L — ABNORMAL LOW (ref 135–145)
Total Bilirubin: 0.6 mg/dL (ref <1.2)
Total Protein: 6.7 g/dL (ref 6.5–8.1)

## 2023-12-13 LAB — ECHOCARDIOGRAM COMPLETE
AR max vel: 3.1 cm2
AV Area VTI: 2.79 cm2
AV Area mean vel: 2.8 cm2
AV Mean grad: 2 mm[Hg]
AV Peak grad: 3.4 mm[Hg]
Ao pk vel: 0.92 m/s
Area-P 1/2: 3.89 cm2
Calc EF: 55.8 %
Height: 68 in
S' Lateral: 3.8 cm
Single Plane A2C EF: 53.4 %
Single Plane A4C EF: 57.4 %
Weight: 3200 [oz_av]

## 2023-12-13 LAB — MAGNESIUM: Magnesium: 2.5 mg/dL — ABNORMAL HIGH (ref 1.7–2.4)

## 2023-12-13 MED ORDER — FUROSEMIDE 10 MG/ML IJ SOLN
40.0000 mg | Freq: Once | INTRAMUSCULAR | Status: AC
  Administered 2023-12-13: 40 mg via INTRAVENOUS
  Filled 2023-12-13: qty 4

## 2023-12-13 MED ORDER — ORAL CARE MOUTH RINSE: 15.0000 mL | OROMUCOSAL | Status: DC | PRN

## 2023-12-13 NOTE — Progress Notes (Signed)
PROGRESS NOTE    Joe Lawrence  VVO:[id number] DOB: 17-Jun-1971 DOA: 12/09/2023 PCP: Ronnald Nian, MD   Brief Narrative:  The patient is a 52 year old obese Caucasian male with a past medical history significant for abdominal to opioid abuse on Suboxone, alcohol abuse in remission, recurrent alcoholic pancreatitis, asthma as well as other comorbidities who presented to the hospital with shortness of breath.  Came to the ED with complaints of shortness of breath and low O2 saturations for the last day or so and had intermittent left-sided chest discomfort for last few weeks.  Had no sick contacts and was noted to be desaturating on room air to the 80s.    Subsequently was worked up and found to have a multifocal pneumonia and is on CAP coverage and have added multiple breathing treatments but given his lack of response there may be some component of heart failure so we will start diuresis and check an echocardiogram.   Was a little bit better with diuresis so we will continue with diuresis again today and awaiting echocardiogram results. O2 to start weaning.  Assessment and Plan:  Multifocal Pneumonia -Multifocal / Atypical PNA -Reportedly COVID negative at home, repeat test in the ED negative for influenza A/B, RSV and COVID-19 -Checking respiratory virus panel x 20 and placed on droplet precautions -Given a continuous DuoNeb 9 mL once yesterday will now place on Xopenex/Atrovent every 6 scheduled, and continue with antibiotics with Ceftriaxone and Azithromycin IV -Will place on guaifenesin 1200 mg p.o. twice daily, and order flutter valve, incentive spirometry -C/w Brovana and Budesonide  -Was Given IV Solu-Medrol 125 mg x 1 but currently not wheezing so we will hold further doses -There is a Mycoplasma pneumonia outbreak in the local area and so is being covered with azithromycin empirically and getting a mycoplasma IgM -Continue with loratadine 10 mg p.o. daily -Add  Tussionex -Repeat chest x-ray done today and showed "Improving aeration in the right lung with persistent interstitial prominence and patchy ill-defined opacities in the left lung, suggesting resolving multilobar bilateral bronchopneumonia." -If not improving or worsening we will obtain a CT scan of the chest and discuss with Pulmonary about further consultation but will try diuresis first given Elevated BNP and CXR findings: Currently awaiting ECHO to be done  -Ambulatory home O2 screen done and he did desaturate but will need to repeat given that he is improving   Acute Respiratory Failure with Hypoxia (HCC) -In the Setting of Above with multifocal/atypical pneumonia and ? Fluid from Volume overload and Suspected CHF -Patient has acute respiratory failure with hypoxia due to having a new oxygen requirement.   -That is the patient has a PaO2 < 60 (pulse Ox < 90%) on room air. -SpO2: 98 % O2 Flow Rate (L/min): 4 L/min -BNP was 312.8 so will continue Diuresis with IV Lasix 40 mg again -Continuous pulse oximetry maintain O2 saturation greater than 90% -Continue supplemental oxygen nasal cannula wean O2 as tolerated -See below -Patient will need an ambulatory home O2 screen prior to discharge and repeat chest x-ray in a.m.  Suspected CHF -BNP was 312.8 and will repeat in the AM -ECHO in 2021 was normal -CXR yesterday AM done and showed "No interval improvement or worsening. Interstitial and patchy hazy opacities of the left-greater-than-right lung fields, pneumonia versus edema or combination, continue unaltered. Minimal pleural effusions." -CXR This AM done and showed "The heart size and mediastinal contours are within normal limits. Minimal interstitial densities are noted throughout both lungs concerning  for possible pneumonia or edema. The visualized skeletal structures are unremarkable." -Check ECHO here and still pending to be done -Strict I's and O's and Daily Weights;  Intake/Output  Summary (Last 24 hours) at 12/13/2023 1237 Last data filed at 12/12/2023 2000 Gross per 24 hour  Intake 750 ml  Output --  Net 750 ml   -Given another dose of IV Lasix 40 mg x1 today -Continue to Monitor for S/Sx of Volume overload and repeat CXR in the AM   Opioid use disorder, severe, dependence (HCC) -Continue home Buprenorphine-Naloxone 0.5 mg SL 5 Times Daily    History of Alcohol aAbuse -Pt adamantly denies any recent EtOH use or risk of withdrawal symptoms (Dr. Julian Reil asked after I noticed some tremor which he attributes to the albuterol breathing treatment)..   -Has been clean for "a while" he says due to pancreatitis. -Pt been through withdrawal multiple times in past. -Continue to Monitor Carefully   Anxiety, Depression and Bipolar Disorder  -Has a Hx of Suicide Attempt x3 but denies any SI, HI, or AVH -C/w Alprazolam 1 mg po at bedtime, Clonidine 0.1 mg po BID, and Queitapine 50 mg po TID and 150 mg po qHS -C/w Escitalopram 20 mg po Daily   Hyponatremia -Na+ Trend: Recent Labs  Lab 12/09/23 2032 12/10/23 0507 12/11/23 0547 12/12/23 0523 12/13/23 0713  NA 133* 131* 136 136 133*  -Continue to Monitor and Trend and Repeat CMP in the AM  Leukopenia -Mild and likely reactive -WBC Trend: Recent Labs  Lab 12/09/23 2032 12/10/23 0507 12/11/23 0547 12/12/23 0523 12/13/23 0713  WBC 6.9 6.9 7.4 5.2 3.9*  -Continue to Monitor and Trend -Repeat CBC in the AM   Hypophosphatemia -Phos Level Trend: Recent Labs  Lab 12/10/23 0504 12/11/23 0547 12/12/23 0523 12/13/23 0713  PHOS 1.9* 2.4* 3.8 4.0  -Continue To Monitor and Replete as Necessary -Repeat Phos Level in the AM  Normocytic Anemia -Had Anemia Panel done recently as an outpatient last month  -Continue with ferrous sulfate 325 mg p.o. twice daily -Hgb/Hct Trend: Recent Labs  Lab 12/09/23 2032 12/10/23 0507 12/11/23 0547 12/12/23 0523 12/13/23 0713  HGB 11.8* 11.4* 11.5* 11.6* 11.5*  HCT 36.6*  37.0* 37.9* 38.1* 36.5*  MCV 90.8 93.7 94.0 95.5 92.9  -Continue to Monitor for S/Sx of Bleeding; No overt bleeding noted and denies any Melena and Hematochezia or Hematuria -Needs outpatient Colonoscopy given Stability of Hgb -Repeat CBC in the AM   GERD/GI Prophylaxis -C/w Pantoprazole 40 mg po BID  Hypoalbuminemia -Patient's Albumin Trend: Recent Labs  Lab 12/10/23 0504 12/11/23 0547 12/12/23 0523 12/13/23 0713  ALBUMIN 3.5 3.5 3.4* 3.3*  -Continue to Monitor and Trend and repeat CMP in the AM  Class I Obesity -Complicates overall prognosis and care -Estimated body mass index is 30.41 kg/m as calculated from the following:   Height as of this encounter: 5\' 8"  (1.727 m).   Weight as of this encounter: 90.7 kg.  -Weight Loss and Dietary Counseling given   DVT prophylaxis: enoxaparin (LOVENOX) injection 40 mg Start: 12/10/23 1000    Code Status: Full Code Family Communication: No family currently at bedside  Disposition Plan:  Level of care: Telemetry Status is: Inpatient Remains inpatient appropriate because: Needs further clinical improvement in Oxygen requirements   Consultants:  None  Procedures:  ECHOCARDIOGRAM ordered and still pending to be done  Antimicrobials:  Anti-infectives (From admission, onward)    Start     Dose/Rate Route Frequency Ordered Stop  12/12/23 2200  azithromycin (ZITHROMAX) tablet 500 mg        500 mg Oral Daily at bedtime 12/12/23 1220 12/14/23 2159   12/10/23 2200  cefTRIAXone (ROCEPHIN) 2 g in sodium chloride 0.9 % 100 mL IVPB        2 g 200 mL/hr over 30 Minutes Intravenous Every 24 hours 12/09/23 2242 12/15/23 2159   12/10/23 2200  azithromycin (ZITHROMAX) 500 mg in sodium chloride 0.9 % 250 mL IVPB  Status:  Discontinued        500 mg 250 mL/hr over 60 Minutes Intravenous Every 24 hours 12/09/23 2242 12/12/23 1220   12/09/23 2200  azithromycin (ZITHROMAX) 500 mg in sodium chloride 0.9 % 250 mL IVPB        500 mg 250 mL/hr  over 60 Minutes Intravenous  Once 12/09/23 2152 12/10/23 0017   12/09/23 2200  cefTRIAXone (ROCEPHIN) 1 g in sodium chloride 0.9 % 100 mL IVPB        1 g 200 mL/hr over 30 Minutes Intravenous  Once 12/09/23 2152 12/09/23 2345       Subjective: Seen and examined at bedside thinks his respiratory status is doing better compared to yesterday.  Able to take a deep breath and with minimal pain on and expression.  Is dyspneic and continues to urinate.  No nausea or vomiting.  Denies any other concerns or complaints at this time.  Objective: Vitals:   12/13/23 0300 12/13/23 0400 12/13/23 0458 12/13/23 0826  BP:   100/68   Pulse:   64   Resp: 20 20 15    Temp:   98.2 F (36.8 C)   TempSrc:   Oral   SpO2:   100% 98%  Weight:      Height:        Intake/Output Summary (Last 24 hours) at 12/13/2023 1250 Last data filed at 12/12/2023 2000 Gross per 24 hour  Intake 750 ml  Output --  Net 750 ml   Filed Weights   12/09/23 2009  Weight: 90.7 kg   Examination: Physical Exam:  Constitutional: WN/WD obese Caucasian male in no acute distress Respiratory: Diminished to auscultation bilaterally with some coarse breath sounds and does have some slight crackles or rhonchi but no appreciable wheezing or rales. Normal respiratory effort and patient is not tachypenic. No accessory muscle use.  Unlabored breathing but is wearing supplemental oxygen via nasal cannula Cardiovascular: RRR, no murmurs / rubs / gallops. S1 and S2 auscultated.  No appreciable extremity edema Abdomen: Soft, non-tender, distended secondary to body habitus. Bowel sounds positive.  GU: Deferred. Musculoskeletal: No clubbing / cyanosis of digits/nails. No joint deformity upper and lower extremities.  Skin: No rashes, lesions, ulcers on limited skin evaluation. No induration; Warm and dry.  Neurologic: CN 2-12 grossly intact with no focal deficits.  Romberg sign and cerebellar reflexes not assessed.  Psychiatric: Normal  judgment and insight. Alert and oriented x 3. Normal mood and appropriate affect.   Data Reviewed: I have personally reviewed following labs and imaging studies  CBC: Recent Labs  Lab 12/09/23 2032 12/10/23 0507 12/11/23 0547 12/12/23 0523 12/13/23 0713  WBC 6.9 6.9 7.4 5.2 3.9*  NEUTROABS  --   --  5.4 2.8 1.7  HGB 11.8* 11.4* 11.5* 11.6* 11.5*  HCT 36.6* 37.0* 37.9* 38.1* 36.5*  MCV 90.8 93.7 94.0 95.5 92.9  PLT 196 194 217 240 242   Basic Metabolic Panel: Recent Labs  Lab 12/09/23 2032 12/10/23 0504 12/10/23 0507 12/11/23 0547 12/12/23  1610 12/13/23 0713  NA 133*  --  131* 136 136 133*  K 3.8  --  4.1 4.5 3.8 3.8  CL 103  --  101 103 103 103  CO2 22  --  22 24 23  21*  GLUCOSE 139*  --  173* 101* 93 106*  BUN 17  --  16 21* 22* 19  CREATININE 1.12  --  1.07 1.02 1.10 0.85  CALCIUM 8.8*  --  8.8* 9.1 8.6* 8.8*  MG  --  2.5*  --  2.4 2.3 2.5*  PHOS  --  1.9*  --  2.4* 3.8 4.0   GFR: Estimated Creatinine Clearance: 108.6 mL/min (by C-G formula based on SCr of 0.85 mg/dL). Liver Function Tests: Recent Labs  Lab 12/10/23 0504 12/11/23 0547 12/12/23 0523 12/13/23 0713  AST 29 21 16 15   ALT 13 14 13 13   ALKPHOS 51 55 54 59  BILITOT 0.4 0.3 0.3 0.6  PROT 7.2 7.0 6.8 6.7  ALBUMIN 3.5 3.5 3.4* 3.3*   Recent Labs  Lab 12/09/23 2032  LIPASE 22   No results for input(s): "AMMONIA" in the last 168 hours. Coagulation Profile: No results for input(s): "INR", "PROTIME" in the last 168 hours. Cardiac Enzymes: No results for input(s): "CKTOTAL", "CKMB", "CKMBINDEX", "TROPONINI" in the last 168 hours. BNP (last 3 results) No results for input(s): "PROBNP" in the last 8760 hours. HbA1C: No results for input(s): "HGBA1C" in the last 72 hours. CBG: No results for input(s): "GLUCAP" in the last 168 hours. Lipid Profile: No results for input(s): "CHOL", "HDL", "LDLCALC", "TRIG", "CHOLHDL", "LDLDIRECT" in the last 72 hours. Thyroid Function Tests: No results for  input(s): "TSH", "T4TOTAL", "FREET4", "T3FREE", "THYROIDAB" in the last 72 hours. Anemia Panel: No results for input(s): "VITAMINB12", "FOLATE", "FERRITIN", "TIBC", "IRON", "RETICCTPCT" in the last 72 hours. Sepsis Labs: No results for input(s): "PROCALCITON", "LATICACIDVEN" in the last 168 hours.  Recent Results (from the past 240 hours)  Resp panel by RT-PCR (RSV, Flu A&B, Covid) Anterior Nasal Swab     Status: None   Collection Time: 12/09/23  9:52 PM   Specimen: Anterior Nasal Swab  Result Value Ref Range Status   SARS Coronavirus 2 by RT PCR NEGATIVE NEGATIVE Final    Comment: (NOTE) SARS-CoV-2 target nucleic acids are NOT DETECTED.  The SARS-CoV-2 RNA is generally detectable in upper respiratory specimens during the acute phase of infection. The lowest concentration of SARS-CoV-2 viral copies this assay can detect is 138 copies/mL. A negative result does not preclude SARS-Cov-2 infection and should not be used as the sole basis for treatment or other patient management decisions. A negative result may occur with  improper specimen collection/handling, submission of specimen other than nasopharyngeal swab, presence of viral mutation(s) within the areas targeted by this assay, and inadequate number of viral copies(<138 copies/mL). A negative result must be combined with clinical observations, patient history, and epidemiological information. The expected result is Negative.  Fact Sheet for Patients:  BloggerCourse.com  Fact Sheet for Healthcare Providers:  SeriousBroker.it  This test is no t yet approved or cleared by the [place] FDA and  has been authorized for detection and/or diagnosis of SARS-CoV-2 by FDA under an Emergency Use Authorization (EUA). This EUA will remain  in effect (meaning this test can be used) for the duration of the COVID-19 declaration under Section 564(b)(1) of the Act, 21 U.S.C.section  360bbb-3(b)(1), unless the authorization is terminated  or revoked sooner.       Influenza  A by PCR NEGATIVE NEGATIVE Final   Influenza B by PCR NEGATIVE NEGATIVE Final    Comment: (NOTE) The Xpert Xpress SARS-CoV-2/FLU/RSV plus assay is intended as an aid in the diagnosis of influenza from Nasopharyngeal swab specimens and should not be used as a sole basis for treatment. Nasal washings and aspirates are unacceptable for Xpert Xpress SARS-CoV-2/FLU/RSV testing.  Fact Sheet for Patients: BloggerCourse.com  Fact Sheet for Healthcare Providers: SeriousBroker.it  This test is not yet approved or cleared by the [place] FDA and has been authorized for detection and/or diagnosis of SARS-CoV-2 by FDA under an Emergency Use Authorization (EUA). This EUA will remain in effect (meaning this test can be used) for the duration of the COVID-19 declaration under Section 564(b)(1) of the Act, 21 U.S.C. section 360bbb-3(b)(1), unless the authorization is terminated or revoked.     Resp Syncytial Virus by PCR NEGATIVE NEGATIVE Final    Comment: (NOTE) Fact Sheet for Patients: BloggerCourse.com  Fact Sheet for Healthcare Providers: SeriousBroker.it  This test is not yet approved or cleared by the [place] FDA and has been authorized for detection and/or diagnosis of SARS-CoV-2 by FDA under an Emergency Use Authorization (EUA). This EUA will remain in effect (meaning this test can be used) for the duration of the COVID-19 declaration under Section 564(b)(1) of the Act, 21 U.S.C. section 360bbb-3(b)(1), unless the authorization is terminated or revoked.  Performed at [facility], [address] [place], [place] [address]   Respiratory (~20 pathogens) panel by PCR     Status: None   Collection Time: 12/09/23 10:40 PM   Specimen: Nasopharyngeal Swab;  Respiratory  Result Value Ref Range Status   Adenovirus NOT DETECTED NOT DETECTED Final   Coronavirus 229E NOT DETECTED NOT DETECTED Final    Comment: (NOTE) The Coronavirus on the Respiratory Panel, DOES NOT test for the novel  Coronavirus (2019 nCoV)    Coronavirus HKU1 NOT DETECTED NOT DETECTED Final   Coronavirus NL63 NOT DETECTED NOT DETECTED Final   Coronavirus OC43 NOT DETECTED NOT DETECTED Final   Metapneumovirus NOT DETECTED NOT DETECTED Final   Rhinovirus / Enterovirus NOT DETECTED NOT DETECTED Final   Influenza A NOT DETECTED NOT DETECTED Final   Influenza B NOT DETECTED NOT DETECTED Final   Parainfluenza Virus 1 NOT DETECTED NOT DETECTED Final   Parainfluenza Virus 2 NOT DETECTED NOT DETECTED Final   Parainfluenza Virus 3 NOT DETECTED NOT DETECTED Final   Parainfluenza Virus 4 NOT DETECTED NOT DETECTED Final   Respiratory Syncytial Virus NOT DETECTED NOT DETECTED Final   Bordetella pertussis NOT DETECTED NOT DETECTED Final   Bordetella Parapertussis NOT DETECTED NOT DETECTED Final   Chlamydophila pneumoniae NOT DETECTED NOT DETECTED Final   Mycoplasma pneumoniae NOT DETECTED NOT DETECTED Final    Comment: Performed at [facility], [address] [place], [place] [address]  Blood culture (routine x 2)     Status: None (Preliminary result)   Collection Time: 12/09/23 10:54 PM   Specimen: BLOOD RIGHT FOREARM  Result Value Ref Range Status   Specimen Description   Final    BLOOD RIGHT FOREARM Performed at [facility], [address] [place], [place] [address]    Special Requests   Final    BOTTLES DRAWN AEROBIC AND ANAEROBIC Blood Culture adequate volume Performed at [facility], [address] [place], [place] [address]    Culture   Final    NO GROWTH 2 DAYS Performed  at [facility], [address] [place], [place] [address]    Report Status PENDING  Incomplete  Blood culture (routine x 2)     Status: None  (Preliminary result)   Collection Time: 12/09/23 11:06 PM   Specimen: BLOOD LEFT WRIST  Result Value Ref Range Status   Specimen Description   Final    BLOOD LEFT WRIST Performed at [facility], [address] [place], [place] [address]    Special Requests   Final    BOTTLES DRAWN AEROBIC AND ANAEROBIC Blood Culture adequate volume Performed at [facility], [address] [place], [place] [address]    Culture   Final    NO GROWTH 2 DAYS Performed at [facility], [address] [place], [place] [address]    Report Status PENDING  Incomplete    Radiology Studies: DG CHEST PORT 1 VIEW Result Date: 12/13/2023 CLINICAL DATA:  52 year old male with history of shortness of breath. EXAM: PORTABLE CHEST 1 VIEW COMPARISON:  Chest x-ray 12/12/2023. FINDINGS: Lung volumes are low. Diffuse interstitial prominence and peribronchial cuffing with some patchy ill-defined opacities, most evident throughout the left lung. Overall, aeration appears slightly improved compared to the prior study, particularly throughout the right lung. No pleural effusions. No pneumothorax. No evidence of pulmonary edema. Heart size is normal. Upper mediastinal contours are within normal limits. IMPRESSION: 1. Improving aeration in the right lung with persistent interstitial prominence and patchy ill-defined opacities in the left lung, suggesting resolving multilobar bilateral bronchopneumonia. Electronically Signed   By: Trudie Reed M.D.   On: 12/13/2023 09:55   DG CHEST PORT 1 VIEW Result Date: 12/12/2023 CLINICAL DATA:  Shortness of breath. EXAM: PORTABLE CHEST 1 VIEW COMPARISON:  December 11, 2023. FINDINGS: The heart size and mediastinal contours are within normal limits. Minimal interstitial densities are noted throughout both lungs concerning for possible pneumonia or edema. The visualized skeletal structures are unremarkable. IMPRESSION: Minimal bilateral interstitial  densities as noted above. Electronically Signed   By: Lupita Raider M.D.   On: 12/12/2023 08:27   Scheduled Meds:  ALPRAZolam  1 mg Oral QHS   arformoterol  15 mcg Nebulization BID   azithromycin  500 mg Oral QHS   budesonide (PULMICORT) nebulizer solution  0.25 mg Nebulization BID   buprenorphine-naloxone  0.5 tablet Sublingual 5 X Daily   cloNIDine  0.1 mg Oral BID   enoxaparin (LOVENOX) injection  40 mg Subcutaneous Q24H   escitalopram  20 mg Oral Daily   ferrous sulfate  325 mg Oral BID WC   furosemide  40 mg Intravenous Once   gabapentin  600 mg Oral QID   guaiFENesin  1,200 mg Oral BID   ipratropium  0.5 mg Nebulization BID   levalbuterol  0.63 mg Nebulization BID   loratadine  10 mg Oral Daily   pantoprazole  40 mg Oral BID   QUEtiapine  150 mg Oral QHS   QUEtiapine  50 mg Oral TID   Continuous Infusions:  cefTRIAXone (ROCEPHIN)  IV Stopped (12/12/23 2144)    LOS: 3 days   Marguerita Merles, DO [facility] Available via Epic secure chat 7am-7pm After these hours, please refer to coverage provider listed on [email or web address] 12/13/2023, 12:50 PM

## 2023-12-13 NOTE — Progress Notes (Signed)
Attempting to wean patient off of O2. Oxygen turned down to 2L/min, Patients O2 Sat Measured for 5 minutes; 100% on 2L [place].

## 2023-12-13 NOTE — Progress Notes (Signed)
  Echocardiogram 2D Echocardiogram has been performed.  Joe Lawrence Joe Lawrence 12/13/2023, 1:59 PM

## 2023-12-13 NOTE — Plan of Care (Signed)

## 2023-12-13 NOTE — Plan of Care (Signed)

## 2023-12-14 ENCOUNTER — Inpatient Hospital Stay (HOSPITAL_COMMUNITY): Payer: Medicare Other

## 2023-12-14 DIAGNOSIS — J189 Pneumonia, unspecified organism: Secondary | ICD-10-CM | POA: Diagnosis not present

## 2023-12-14 DIAGNOSIS — F1120 Opioid dependence, uncomplicated: Secondary | ICD-10-CM | POA: Diagnosis not present

## 2023-12-14 DIAGNOSIS — J9601 Acute respiratory failure with hypoxia: Secondary | ICD-10-CM | POA: Diagnosis not present

## 2023-12-14 DIAGNOSIS — F1011 Alcohol abuse, in remission: Secondary | ICD-10-CM | POA: Diagnosis not present

## 2023-12-14 LAB — PHOSPHORUS: Phosphorus: 3.9 mg/dL (ref 2.5–4.6)

## 2023-12-14 LAB — COMPREHENSIVE METABOLIC PANEL
ALT: 15 U/L (ref 0–44)
AST: 15 U/L (ref 15–41)
Albumin: 3.2 g/dL — ABNORMAL LOW (ref 3.5–5.0)
Alkaline Phosphatase: 59 U/L (ref 38–126)
Anion gap: 6 (ref 5–15)
BUN: 20 mg/dL (ref 6–20)
CO2: 27 mmol/L (ref 22–32)
Calcium: 9 mg/dL (ref 8.9–10.3)
Chloride: 100 mmol/L (ref 98–111)
Creatinine, Ser: 0.97 mg/dL (ref 0.61–1.24)
GFR, Estimated: >60 mL/min (ref >60)
Glucose, Bld: 111 mg/dL — ABNORMAL HIGH (ref 70–99)
Potassium: 3.6 mmol/L (ref 3.5–5.1)
Sodium: 133 mmol/L — ABNORMAL LOW (ref 135–145)
Total Bilirubin: 0.3 mg/dL (ref <1.2)
Total Protein: 6.7 g/dL (ref 6.5–8.1)

## 2023-12-14 LAB — CBC WITH DIFFERENTIAL/PLATELET
Abs Immature Granulocytes: 0.04 10*3/uL (ref 0.00–0.07)
Basophils Absolute: 0 10*3/uL (ref 0.0–0.1)
Basophils Relative: 1 %
Eosinophils Absolute: 0.3 10*3/uL (ref 0.0–0.5)
Eosinophils Relative: 9 %
HCT: 37 % — ABNORMAL LOW (ref 39.0–52.0)
Hemoglobin: 11.8 g/dL — ABNORMAL LOW (ref 13.0–17.0)
Immature Granulocytes: 1 %
Lymphocytes Relative: 32 %
Lymphs Abs: 1.2 10*3/uL (ref 0.7–4.0)
MCH: 28.9 pg (ref 26.0–34.0)
MCHC: 31.9 g/dL (ref 30.0–36.0)
MCV: 90.5 fL (ref 80.0–100.0)
Monocytes Absolute: 0.5 10*3/uL (ref 0.1–1.0)
Monocytes Relative: 13 %
Neutro Abs: 1.6 10*3/uL — ABNORMAL LOW (ref 1.7–7.7)
Neutrophils Relative %: 44 %
Platelets: 268 10*3/uL (ref 150–400)
RBC: 4.09 MIL/uL — ABNORMAL LOW (ref 4.22–5.81)
RDW: 14.2 % (ref 11.5–15.5)
WBC: 3.7 10*3/uL — ABNORMAL LOW (ref 4.0–10.5)
nRBC: 0 % (ref 0.0–0.2)

## 2023-12-14 LAB — MAGNESIUM: Magnesium: 2.3 mg/dL (ref 1.7–2.4)

## 2023-12-14 LAB — BRAIN NATRIURETIC PEPTIDE: B Natriuretic Peptide: 17.9 pg/mL (ref 0.0–100.0)

## 2023-12-14 MED ORDER — LEVALBUTEROL HCL 0.63 MG/3ML IN NEBU: 0.6300 mg | INHALATION_SOLUTION | RESPIRATORY_TRACT | 12 refills | Status: DC | PRN

## 2023-12-14 MED ORDER — LEVALBUTEROL TARTRATE 45 MCG/ACT IN AERO: 1.0000 | INHALATION_SPRAY | RESPIRATORY_TRACT | 12 refills | Status: DC | PRN

## 2023-12-14 MED ORDER — GUAIFENESIN ER 600 MG PO TB12: 600.0000 mg | ORAL_TABLET | Freq: Two times a day (BID) | ORAL | 0 refills | Status: AC

## 2023-12-14 MED ORDER — AMOXICILLIN-POT CLAVULANATE 875-125 MG PO TABS: 1.0000 | ORAL_TABLET | Freq: Two times a day (BID) | ORAL | 0 refills | Status: AC

## 2023-12-14 NOTE — Discharge Summary (Signed)
Physician Discharge Summary   Patient: Joe Lawrence MRN: [id number] DOB: 09/22/1971  Admit date:     12/09/2023  Discharge date: 12/14/23  Discharge Physician: Marguerita Merles, DO   PCP: Ronnald Nian, MD   Recommendations at discharge:   Follow-up with PCP within 1 to 2 weeks repeat CBC, CMP, mag, Phos within 1 week Repeat chest x-ray in 3 to 6 weeks  Discharge Diagnoses: Principal Problem:   Multifocal pneumonia Active Problems:   Acute respiratory failure with hypoxia (HCC)   History of alcohol abuse   Opioid use disorder, severe, dependence (HCC)   Severe major depression, single episode, without psychotic features (HCC)  Resolved Problems:   * No resolved hospital problems. [facility] Course: The patient is a 52 year old obese Caucasian male with a past medical history significant for abdominal to opioid abuse on Suboxone, alcohol abuse in remission, recurrent alcoholic pancreatitis, asthma as well as other comorbidities who presented to the hospital with shortness of breath.  Came to the ED with complaints of shortness of breath and low O2 saturations for the last day or so and had intermittent left-sided chest discomfort for last few weeks.  Had no sick contacts and was noted to be desaturating on room air to the 80s.    Subsequently was worked up and found to have a multifocal pneumonia and is on CAP coverage and have added multiple breathing treatments but given his lack of response there may be some component of heart failure so we will start diuresis and check an echocardiogram.   Was a little bit better with diuresis so we will continue with diuresis but echocardiogram shows no evidence of heart failure.  Diuresis was stopped and his respiratory status improved significantly.  He was able to be weaned off of supplemental oxygen and ambulated without issues.  He is medically stable to be discharged at this time and follow-up with PCP and repeat chest x-ray in 3  to 6 weeks.  Assessment and Plan:  Multifocal Pneumonia, improved  -Multifocal / Atypical PNA -Reportedly COVID negative at home, repeat test in the ED negative for influenza A/B, RSV and COVID-19 -Checking respiratory virus panel x 20 and placed on droplet precautions -Given a continuous DuoNeb 9 mL once yesterday will now place on Xopenex/Atrovent every 6 scheduled, and continue with antibiotics with Ceftriaxone and Azithromycin IV and change to po Augmentin for D/C  -Will place on guaifenesin 1200 mg p.o. twice daily, and order flutter valve, incentive spirometry -C/w Brovana and Budesonide  -Was Given IV Solu-Medrol 125 mg x 1 but currently not wheezing so we will hold further doses -There is a Mycoplasma pneumonia outbreak in the local area and so is being covered with azithromycin empirically and getting a mycoplasma IgM -Continue with loratadine 10 mg p.o. daily -Add Tussionex -Repeat chest x-ray done today and showed "Lung volumes are normal. No consolidative airspace disease. No pleural effusions. No pneumothorax. No pulmonary nodule or mass noted. Pulmonary vasculature and the cardiomediastinal silhouette are within normal limits." -If not improving or worsening we will obtain a CT scan of the chest and discuss with Pulmonary about further consultation but will try diuresis first given Elevated BNP and CXR findings: Currently echocardiogram as below -Ambulatory home O2 screen done and he did not desaturate and chest x-ray shows improvement with no radiographic evidence of cardiopulmonary disease.  He was able to be weaned off of supplemental oxygen   Acute Respiratory Failure with Hypoxia (HCC), improved  -In the  Setting of Above with multifocal/atypical pneumonia and ? Fluid from Volume overload and Suspected CHF -Patient has acute respiratory failure with hypoxia due to having a new oxygen requirement.   -That is the patient has a PaO2 < 60 (pulse Ox < 90%) on room air. -SpO2: 97  % O2 Flow Rate (L/min): 2 L/min -BNP was 312.8 so was given Diuresis and improved -Continuous pulse oximetry maintain O2 saturation greater than 90% -Continue supplemental oxygen nasal cannula wean O2 as tolerated -See below -Patient will need an ambulatory home O2 screen prior to discharge and didn't desaturate  Suspected CHF, ruled out -BNP was 312.8 and will repeat was 17.9 -ECHO in 2021 was normal -Check ECHO here and showed "Left ventricular ejection fraction, by estimation, is 55 to 60%. The left ventricle has normal function. The left ventricle has no regional wall motion abnormalities. Left ventricular diastolic parameters were normal.  Right ventricular systolic function is normal. The right ventricular size is normal." -Strict I's and O's and Daily Weights; No intake or output data in the 24 hours ending 12/14/23 2218  -Given another dose of IV Lasix 40 mg yesterday -Continue to Monitor for S/Sx of Volume overload and repeat CXR in 3-6 weeks  -Repeat CXR done and showed "Lung volumes are normal. No consolidative airspace disease. No pleural effusions. No pneumothorax. No pulmonary nodule or mass noted. Pulmonary vasculature and the cardiomediastinal silhouette are within normal limits."   Opioid use disorder, severe, dependence (HCC) -Continue home Buprenorphine-Naloxone 0.5 mg SL 5 Times Daily    History of Alcohol aAbuse -Pt adamantly denies any recent EtOH use or risk of withdrawal symptoms (Dr. Julian Reil asked after I noticed some tremor which he attributes to the albuterol breathing treatment)..   -Has been clean for "a while" he says due to pancreatitis. -Pt been through withdrawal multiple times in past. -Continue to Monitor Carefully   Anxiety, Depression and Bipolar Disorder  -Has a Hx of Suicide Attempt x3 but denies any SI, HI, or AVH -C/w Alprazolam 1 mg po at bedtime, Clonidine 0.1 mg po BID, and Queitapine 50 mg po TID and 150 mg po qHS -C/w Escitalopram 20 mg po  Daily   Hyponatremia -Na+ Trend: Recent Labs  Lab 12/09/23 2032 12/10/23 0507 12/11/23 0547 12/12/23 0523 12/13/23 0713 12/14/23 0547  NA 133* 131* 136 136 133* 133*  -Continue to Monitor and Trend and Repeat CMP within 1 week  Leukopenia -Mild and likely reactive -WBC Trend: Recent Labs  Lab 12/09/23 2032 12/10/23 0507 12/11/23 0547 12/12/23 0523 12/13/23 0713 12/14/23 0711  WBC 6.9 6.9 7.4 5.2 3.9* 3.7*  -Continue to Monitor and Trend -Repeat CBC within 1 week  Hypophosphatemia -Phos Level Trend: Recent Labs  Lab 12/10/23 0504 12/11/23 0547 12/12/23 0523 12/13/23 0713 12/14/23 0547  PHOS 1.9* 2.4* 3.8 4.0 3.9  -Continue To Monitor and Replete as Necessary -Repeat Phos Level within 1 week  Normocytic Anemia -Had Anemia Panel done recently as an outpatient last month  -Continue with ferrous sulfate 325 mg p.o. twice daily -Hgb/Hct Trend: Recent Labs  Lab 12/09/23 2032 12/10/23 0507 12/11/23 0547 12/12/23 0523 12/13/23 0713 12/14/23 0711  HGB 11.8* 11.4* 11.5* 11.6* 11.5* 11.8*  HCT 36.6* 37.0* 37.9* 38.1* 36.5* 37.0*  MCV 90.8 93.7 94.0 95.5 92.9 90.5  -Continue to Monitor for S/Sx of Bleeding; No overt bleeding noted and denies any Melena and Hematochezia or Hematuria -Needs outpatient Colonoscopy given Stability of Hgb -Repeat CBC in the AM   GERD/GI Prophylaxis -C/w  Pantoprazole 40 mg po BID  Hypoalbuminemia -Patient's Albumin Trend: Recent Labs  Lab 12/10/23 0504 12/11/23 0547 12/12/23 0523 12/13/23 0713 12/14/23 0547  ALBUMIN 3.5 3.5 3.4* 3.3* 3.2*  -Continue to Monitor and Trend and repeat CMP within 1 week  Class I Obesity -Complicates overall prognosis and care -Estimated body mass index is 30.41 kg/m as calculated from the following:   Height as of this encounter: 5\' 8"  (1.727 m).   Weight as of this encounter: 90.7 kg.  -Weight Loss and Dietary Counseling given  Consultants: None Procedures performed: As delineated as  above  Disposition: Home Diet recommendation:  Discharge Diet Orders (From admission, onward)     Start     Ordered   12/14/23 0000  Diet - low sodium heart healthy        12/14/23 1210           Cardiac diet DISCHARGE MEDICATION: Allergies as of 12/14/2023       Reactions   Celexa [citalopram] Other (See Comments)   Muscle tighness   Chlorpromazine Hcl Other (See Comments)   Pt does not remember this allergy.  May be 20 years ago.   Iodine Other (See Comments)   Skin has burning sensation.   Sulfonamide Derivatives Other (See Comments)   Patient does not know of this allergy or the reaction.   Voltaren [diclofenac Sodium] Other (See Comments)   voltaren has caused his arm to swell in the past, does not remember when this happened.        Medication List     STOP taking these medications    methocarbamol 500 MG tablet Commonly known as: ROBAXIN   Sublocade 300 MG/1.5ML injection Generic drug: buprenorphine ER       TAKE these medications    amoxicillin-clavulanate 875-125 MG tablet Commonly known as: AUGMENTIN Take 1 tablet by mouth 2 (two) times daily for 3 days.   cloNIDine 0.1 MG tablet Commonly known as: CATAPRES Take 0.5-1 tablets by mouth 4 (four) times daily as needed (anxiety).   escitalopram 20 MG tablet Commonly known as: LEXAPRO Take 1 tablet (20 mg total) by mouth daily.   ferrous sulfate 325 (65 FE) MG EC tablet Take 325 mg by mouth in the morning and at bedtime.   gabapentin 300 MG capsule Commonly known as: NEURONTIN Take 2 capsules (600 mg total) by mouth 4 (four) times daily.   guaiFENesin 600 MG 12 hr tablet Commonly known as: MUCINEX Take 1 tablet (600 mg total) by mouth 2 (two) times daily for 5 days.   hydrOXYzine 50 MG capsule Commonly known as: VISTARIL Take 50 mg by mouth as needed for anxiety.   levalbuterol 0.63 MG/3ML nebulizer solution Commonly known as: XOPENEX Take 3 mLs (0.63 mg total) by nebulization every 4  (four) hours as needed for wheezing or shortness of breath.   levalbuterol 45 MCG/ACT inhaler Commonly known as: XOPENEX HFA Inhale 1 puff into the lungs as needed for wheezing or shortness of breath.   loratadine 10 MG tablet Commonly known as: CLARITIN Take 10 mg by mouth daily.   olopatadine 0.1 % ophthalmic solution Commonly known as: Pataday Place 1 drop into both eyes daily as needed (itching). What changed:  when to take this reasons to take this   ondansetron 4 MG tablet Commonly known as: ZOFRAN Take 4 mg by mouth as needed for nausea or vomiting.   OVER THE COUNTER MEDICATION Take 1-2 capsules by mouth with breakfast, with lunch, and with  evening meal. Enzymatica digestive enzymes   pantoprazole 40 MG tablet Commonly known as: PROTONIX Take 1 tablet (40 mg total) by mouth 2 (two) times daily.   prazosin 1 MG capsule Commonly known as: MINIPRESS Take 1 mg by mouth at bedtime.   QUEtiapine 50 MG tablet Commonly known as: SEROQUEL Take 1 tablet (50 mg total) by mouth 2 (two) times daily. What changed:  how much to take when to take this additional instructions   Suboxone 8-2 MG Film Generic drug: Buprenorphine HCl-Naloxone HCl Place 0.5 each under the tongue 5 (five) times daily.   Xiidra 5 % Soln Generic drug: Lifitegrast Place 1 drop into both eyes 2 (two) times daily.               Durable Medical Equipment  (From admission, onward)           Start     Ordered   12/14/23 0000  For home use only DME Nebulizer machine       Question Answer Comment  Patient needs a nebulizer to treat with the following condition Atypical pneumonia   Length of Need 6 Months      12/14/23 1210            Discharge Exam: Ceasar Mons Weights   12/09/23 2009  Weight: 90.7 kg   Vitals:   12/14/23 0517 12/14/23 0836  BP:    Pulse: (!) 53   Resp: 18   Temp:    SpO2: 99% 97%   Examination: Physical Exam:  Constitutional: WN/WD obese Caucasian male  in NAD Respiratory: Diminished to auscultation bilaterally, no wheezing, rales, rhonchi or crackles. Normal respiratory effort and patient is not tachypenic. No accessory muscle use. Unlabored breathing  Cardiovascular: RRR, no murmurs / rubs / gallops. S1 and S2 auscultated. No extremity edema.   Abdomen: Soft, non-tender, Distended 2/2 body habitus. Bowel sounds positive.  GU: Deferred. Musculoskeletal: No clubbing / cyanosis of digits/nails. No joint deformity upper and lower extremities.  Skin: No rashes, lesions, ulcers on a limited skin evaluation. No induration; Warm and dry.  Neurologic: CN 2-12 grossly intact with no focal deficits. Romberg sign and cerebellar reflexes not assessed.  Psychiatric: Normal judgment and insight. Alert and oriented x 3. Normal mood and appropriate affect.   Condition at discharge: stable  The results of significant diagnostics from this hospitalization (including imaging, microbiology, ancillary and laboratory) are listed below for reference.   Imaging Studies: DG CHEST PORT 1 VIEW Result Date: 12/14/2023 CLINICAL DATA:  52 year old male with history of shortness of breath. EXAM: PORTABLE CHEST 1 VIEW COMPARISON:  Chest x-ray 12/13/2023. FINDINGS: Lung volumes are normal. No consolidative airspace disease. No pleural effusions. No pneumothorax. No pulmonary nodule or mass noted. Pulmonary vasculature and the cardiomediastinal silhouette are within normal limits. IMPRESSION: No radiographic evidence of acute cardiopulmonary disease. Electronically Signed   By: Trudie Reed M.D.   On: 12/14/2023 09:35   ECHOCARDIOGRAM COMPLETE Result Date: 12/13/2023    ECHOCARDIOGRAM REPORT   Patient Name:   KIPPER WERTHEIMER Date of Exam: 12/13/2023 Medical Rec #:  [id number]                Height:       68.0 in Accession #:    [id number]               Weight:       200.0 lb Date of Birth:  03-16-1971  BSA:          2.044 m Patient Age:    52 years                  BP:           100/68 mmHg Patient Gender: M                        HR:           52 bpm. Exam Location:  Inpatient Procedure: 2D Echo, Cardiac Doppler and Color Doppler Indications:    Dyspnea  History:        Patient has prior history of Echocardiogram examinations, most                 recent 10/18/2020. Risk Factors:Current Smoker.  Sonographer:    Karma Ganja Referring Phys: [id number] Kateri Mc [facility] IMPRESSIONS  1. Left ventricular ejection fraction, by estimation, is 55 to 60%. The left ventricle has normal function. The left ventricle has no regional wall motion abnormalities. Left ventricular diastolic parameters were normal.  2. Right ventricular systolic function is normal. The right ventricular size is normal.  3. The mitral valve is normal in structure. No evidence of mitral valve regurgitation. No evidence of mitral stenosis.  4. The aortic valve is normal in structure. Aortic valve regurgitation is not visualized. No aortic stenosis is present.  5. The inferior vena cava is normal in size with greater than 50% respiratory variability, suggesting right atrial pressure of 3 mmHg. Comparison(s): No significant change from prior study. Prior images reviewed side by side. FINDINGS  Left Ventricle: Left ventricular ejection fraction, by estimation, is 55 to 60%. The left ventricle has normal function. The left ventricle has no regional wall motion abnormalities. The left ventricular internal cavity size was normal in size. There is  no left ventricular hypertrophy. Left ventricular diastolic parameters were normal. Right Ventricle: The right ventricular size is normal. No increase in right ventricular wall thickness. Right ventricular systolic function is normal. Left Atrium: Left atrial size was normal in size. Right Atrium: Right atrial size was normal in size. Pericardium: There is no evidence of pericardial effusion. Mitral Valve: The mitral valve is normal in structure. No evidence of  mitral valve regurgitation. No evidence of mitral valve stenosis. Tricuspid Valve: The tricuspid valve is normal in structure. Tricuspid valve regurgitation is not demonstrated. No evidence of tricuspid stenosis. Aortic Valve: The aortic valve is normal in structure. Aortic valve regurgitation is not visualized. No aortic stenosis is present. Aortic valve mean gradient measures 2.0 mmHg. Aortic valve peak gradient measures 3.4 mmHg. Aortic valve area, by VTI measures 2.79 cm. Pulmonic Valve: The pulmonic valve was normal in structure. Pulmonic valve regurgitation is not visualized. No evidence of pulmonic stenosis. Aorta: The aortic root is normal in size and structure. Venous: The inferior vena cava is normal in size with greater than 50% respiratory variability, suggesting right atrial pressure of 3 mmHg. IAS/Shunts: No atrial level shunt detected by color flow Doppler.  LEFT VENTRICLE PLAX 2D LVIDd:         5.20 cm     Diastology LVIDs:         3.80 cm     LV e' medial:    8.38 cm/s LV PW:         0.90 cm     LV E/e' medial:  9.5 LV IVS:        1.00  cm     LV e' lateral:   8.70 cm/s LVOT diam:     2.00 cm     LV E/e' lateral: 9.1 LV SV:         58 LV SV Index:   28 LVOT Area:     3.14 cm  LV Volumes (MOD) LV vol d, MOD A2C: 69.5 ml LV vol d, MOD A4C: 94.4 ml LV vol s, MOD A2C: 32.4 ml LV vol s, MOD A4C: 40.2 ml LV SV MOD A2C:     37.1 ml LV SV MOD A4C:     94.4 ml LV SV MOD BP:      45.2 ml RIGHT VENTRICLE             IVC RV Basal diam:  4.30 cm     IVC diam: 1.00 cm RV S prime:     11.60 cm/s TAPSE (M-mode): 3.4 cm LEFT ATRIUM             Index        RIGHT ATRIUM           Index LA diam:        4.10 cm 2.01 cm/m   RA Area:     16.40 cm LA Vol (A2C):   33.3 ml 16.29 ml/m  RA Volume:   42.30 ml  20.70 ml/m LA Vol (A4C):   38.3 ml 18.74 ml/m LA Biplane Vol: 37.9 ml 18.54 ml/m  AORTIC VALVE AV Area (Vmax):    3.10 cm AV Area (Vmean):   2.80 cm AV Area (VTI):     2.79 cm AV Vmax:           91.80 cm/s AV  Vmean:          68.200 cm/s AV VTI:            0.207 m AV Peak Grad:      3.4 mmHg AV Mean Grad:      2.0 mmHg LVOT Vmax:         90.70 cm/s LVOT Vmean:        60.700 cm/s LVOT VTI:          0.184 m LVOT/AV VTI ratio: 0.89  AORTA Ao Root diam: 3.20 cm Ao Asc diam:  2.40 cm MITRAL VALVE MV Area (PHT): 3.89 cm    SHUNTS MV Decel Time: 195 msec    Systemic VTI:  0.18 m MV E velocity: 79.30 cm/s  Systemic Diam: 2.00 cm MV A velocity: 67.70 cm/s MV E/A ratio:  1.17 Mihai Croitoru MD Electronically signed by Thurmon Fair MD Signature Date/Time: 12/13/2023/4:34:40 PM    Final    DG CHEST PORT 1 VIEW Result Date: 12/13/2023 CLINICAL DATA:  52 year old male with history of shortness of breath. EXAM: PORTABLE CHEST 1 VIEW COMPARISON:  Chest x-ray 12/12/2023. FINDINGS: Lung volumes are low. Diffuse interstitial prominence and peribronchial cuffing with some patchy ill-defined opacities, most evident throughout the left lung. Overall, aeration appears slightly improved compared to the prior study, particularly throughout the right lung. No pleural effusions. No pneumothorax. No evidence of pulmonary edema. Heart size is normal. Upper mediastinal contours are within normal limits. IMPRESSION: 1. Improving aeration in the right lung with persistent interstitial prominence and patchy ill-defined opacities in the left lung, suggesting resolving multilobar bilateral bronchopneumonia. Electronically Signed   By: Trudie Reed M.D.   On: 12/13/2023 09:55   DG CHEST PORT 1 VIEW Result Date: 12/12/2023 CLINICAL DATA:  Shortness of breath.  EXAM: PORTABLE CHEST 1 VIEW COMPARISON:  December 11, 2023. FINDINGS: The heart size and mediastinal contours are within normal limits. Minimal interstitial densities are noted throughout both lungs concerning for possible pneumonia or edema. The visualized skeletal structures are unremarkable. IMPRESSION: Minimal bilateral interstitial densities as noted above. Electronically Signed    By: Lupita Raider M.D.   On: 12/12/2023 08:27   DG CHEST PORT 1 VIEW Result Date: 12/11/2023 CLINICAL DATA:  [id number] with shortness of breath. EXAM: PORTABLE CHEST 1 VIEW COMPARISON:  Portable chest yesterday at 9:10 a.m. FINDINGS: 5:01 a.m. The cardiac size is borderline. Central vascular fullness continues to be seen. Interstitial and patchy hazy opacities of the left-greater-than-right lung fields, pneumonia versus edema or combination, continue to be noted. There is no interval improvement or worsening, no new infiltrate. Minimal pleural effusions appear similar. The mediastinal configuration is normal. No new osseous abnormality. IMPRESSION: 1. No interval improvement or worsening. 2. Interstitial and patchy hazy opacities of the left-greater-than-right lung fields, pneumonia versus edema or combination, continue unaltered. 3. Minimal pleural effusions. Electronically Signed   By: Almira Bar M.D.   On: 12/11/2023 08:04   DG CHEST PORT 1 VIEW Result Date: 12/10/2023 CLINICAL DATA:  Shortness of breath. EXAM: PORTABLE CHEST 1 VIEW COMPARISON:  Chest x-ray from yesterday. FINDINGS: The heart size and mediastinal contours are within normal limits. Normal pulmonary vascularity. Slightly improved aeration compared to yesterday. Diffuse hazy opacities throughout both lungs are not significantly changed. No pleural effusion or pneumothorax. No acute osseous abnormality. IMPRESSION: 1. Unchanged multifocal pulmonary infiltrates. Electronically Signed   By: Obie Dredge M.D.   On: 12/10/2023 13:09   DG Chest 2 View Result Date: 12/09/2023 CLINICAL DATA:  Shortness of breath EXAM: CHEST - 2 VIEW COMPARISON:  10/18/2020 FINDINGS: Cardiac shadow is within normal limits. Lungs are hypoinflated. Diffuse airspace opacity is noted bilaterally consistent with multifocal infiltrate. No sizable effusion is seen. No bony abnormality is noted. IMPRESSION: Multifocal bilateral infiltrates. Electronically Signed    By: Alcide Clever M.D.   On: 12/09/2023 20:57   Microbiology: Results for orders placed or performed during the hospital encounter of 12/09/23  Resp panel by RT-PCR (RSV, Flu A&B, Covid) Anterior Nasal Swab     Status: None   Collection Time: 12/09/23  9:52 PM   Specimen: Anterior Nasal Swab  Result Value Ref Range Status   SARS Coronavirus 2 by RT PCR NEGATIVE NEGATIVE Final    Comment: (NOTE) SARS-CoV-2 target nucleic acids are NOT DETECTED.  The SARS-CoV-2 RNA is generally detectable in upper respiratory specimens during the acute phase of infection. The lowest concentration of SARS-CoV-2 viral copies this assay can detect is 138 copies/mL. A negative result does not preclude SARS-Cov-2 infection and should not be used as the sole basis for treatment or other patient management decisions. A negative result may occur with  improper specimen collection/handling, submission of specimen other than nasopharyngeal swab, presence of viral mutation(s) within the areas targeted by this assay, and inadequate number of viral copies(<138 copies/mL). A negative result must be combined with clinical observations, patient history, and epidemiological information. The expected result is Negative.  Fact Sheet for Patients:  BloggerCourse.com  Fact Sheet for Healthcare Providers:  SeriousBroker.it  This test is no t yet approved or cleared by the [place] FDA and  has been authorized for detection and/or diagnosis of SARS-CoV-2 by FDA under an Emergency Use Authorization (EUA). This EUA will remain  in effect (meaning this test can  be used) for the duration of the COVID-19 declaration under Section 564(b)(1) of the Act, 21 U.S.C.section 360bbb-3(b)(1), unless the authorization is terminated  or revoked sooner.       Influenza A by PCR NEGATIVE NEGATIVE Final   Influenza B by PCR NEGATIVE NEGATIVE Final    Comment: (NOTE) The Xpert  Xpress SARS-CoV-2/FLU/RSV plus assay is intended as an aid in the diagnosis of influenza from Nasopharyngeal swab specimens and should not be used as a sole basis for treatment. Nasal washings and aspirates are unacceptable for Xpert Xpress SARS-CoV-2/FLU/RSV testing.  Fact Sheet for Patients: BloggerCourse.com  Fact Sheet for Healthcare Providers: SeriousBroker.it  This test is not yet approved or cleared by the [place] FDA and has been authorized for detection and/or diagnosis of SARS-CoV-2 by FDA under an Emergency Use Authorization (EUA). This EUA will remain in effect (meaning this test can be used) for the duration of the COVID-19 declaration under Section 564(b)(1) of the Act, 21 U.S.C. section 360bbb-3(b)(1), unless the authorization is terminated or revoked.     Resp Syncytial Virus by PCR NEGATIVE NEGATIVE Final    Comment: (NOTE) Fact Sheet for Patients: BloggerCourse.com  Fact Sheet for Healthcare Providers: SeriousBroker.it  This test is not yet approved or cleared by the [place] FDA and has been authorized for detection and/or diagnosis of SARS-CoV-2 by FDA under an Emergency Use Authorization (EUA). This EUA will remain in effect (meaning this test can be used) for the duration of the COVID-19 declaration under Section 564(b)(1) of the Act, 21 U.S.C. section 360bbb-3(b)(1), unless the authorization is terminated or revoked.  Performed at [facility], [address], [place], [place] [address]   Respiratory (~20 pathogens) panel by PCR     Status: None   Collection Time: 12/09/23 10:40 PM   Specimen: Nasopharyngeal Swab; Respiratory  Result Value Ref Range Status   Adenovirus NOT DETECTED NOT DETECTED Final   Coronavirus 229E NOT DETECTED NOT DETECTED Final    Comment: (NOTE) The Coronavirus on the Respiratory Panel, DOES NOT  test for the novel  Coronavirus (2019 nCoV)    Coronavirus HKU1 NOT DETECTED NOT DETECTED Final   Coronavirus NL63 NOT DETECTED NOT DETECTED Final   Coronavirus OC43 NOT DETECTED NOT DETECTED Final   Metapneumovirus NOT DETECTED NOT DETECTED Final   Rhinovirus / Enterovirus NOT DETECTED NOT DETECTED Final   Influenza A NOT DETECTED NOT DETECTED Final   Influenza B NOT DETECTED NOT DETECTED Final   Parainfluenza Virus 1 NOT DETECTED NOT DETECTED Final   Parainfluenza Virus 2 NOT DETECTED NOT DETECTED Final   Parainfluenza Virus 3 NOT DETECTED NOT DETECTED Final   Parainfluenza Virus 4 NOT DETECTED NOT DETECTED Final   Respiratory Syncytial Virus NOT DETECTED NOT DETECTED Final   Bordetella pertussis NOT DETECTED NOT DETECTED Final   Bordetella Parapertussis NOT DETECTED NOT DETECTED Final   Chlamydophila pneumoniae NOT DETECTED NOT DETECTED Final   Mycoplasma pneumoniae NOT DETECTED NOT DETECTED Final    Comment: Performed at [facility], [address] [place], [place] [address]  Blood culture (routine x 2)     Status: None (Preliminary result)   Collection Time: 12/09/23 10:54 PM   Specimen: BLOOD RIGHT FOREARM  Result Value Ref Range Status   Specimen Description   Final    BLOOD RIGHT FOREARM Performed at [facility], [address], [place], [place] [address]    Special Requests   Final    BOTTLES DRAWN  AEROBIC AND ANAEROBIC Blood Culture adequate volume Performed at [facility], [address], [place], [place] [address]    Culture   Final    NO GROWTH 4 DAYS Performed at [facility], [address], [place], [place] [address]    Report Status PENDING  Incomplete  Blood culture (routine x 2)     Status: None (Preliminary result)   Collection Time: 12/09/23 11:06 PM   Specimen: BLOOD LEFT WRIST  Result Value Ref Range Status   Specimen Description   Final    BLOOD LEFT WRIST Performed at [facility], [address], [place], [place] [address]    Special Requests   Final    BOTTLES DRAWN AEROBIC AND ANAEROBIC Blood Culture adequate volume Performed at [facility], [address] [place], [place] [address]    Culture   Final    NO GROWTH 4 DAYS Performed at [facility], [address] [place], [place] [address]    Report Status PENDING  Incomplete   Labs: CBC: Recent Labs  Lab 12/10/23 0507 12/11/23 0547 12/12/23 0523 12/13/23 0713 12/14/23 0711  WBC 6.9 7.4 5.2 3.9* 3.7*  NEUTROABS  --  5.4 2.8 1.7 1.6*  HGB 11.4* 11.5* 11.6* 11.5* 11.8*  HCT 37.0* 37.9* 38.1* 36.5* 37.0*  MCV 93.7 94.0 95.5 92.9 90.5  PLT 194 217 240 242 268   Basic Metabolic Panel: Recent Labs  Lab 12/10/23 0504 12/10/23 0507 12/11/23 0547 12/12/23 0523 12/13/23 0713 12/14/23 0547  NA  --  131* 136 136 133* 133*  K  --  4.1 4.5 3.8 3.8 3.6  CL  --  101 103 103 103 100  CO2  --  22 24 23  21* 27  GLUCOSE  --  173* 101* 93 106* 111*  BUN  --  16 21* 22* 19 20  CREATININE  --  1.07 1.02 1.10 0.85 0.97  CALCIUM  --  8.8* 9.1 8.6* 8.8* 9.0  MG 2.5*  --  2.4 2.3 2.5* 2.3  PHOS 1.9*  --  2.4* 3.8 4.0 3.9   Liver Function Tests: Recent Labs  Lab 12/10/23 0504 12/11/23 0547 12/12/23 0523 12/13/23 0713 12/14/23 0547  AST 29 21 16 15 15   ALT 13 14 13 13 15   ALKPHOS 51 55 54 59 59  BILITOT 0.4 0.3 0.3 0.6 0.3  PROT 7.2 7.0 6.8 6.7 6.7  ALBUMIN 3.5 3.5 3.4* 3.3* 3.2*   CBG: No results for input(s): "GLUCAP" in the last 168 hours.  Discharge time spent: greater than 30 minutes.  Signed: Marguerita Merles, DO [facility] 12/14/2023

## 2023-12-14 NOTE — Progress Notes (Signed)
Attempting to wean patient off of O2. Oxygen turned down to 1L/min, Patients O2 Sat Measured for 5 minutes; 97-99% on 1L [place].

## 2023-12-14 NOTE — TOC Transition Note (Signed)
Transition of Care [facility]) - Discharge Note   Patient Details  Name: Joe Lawrence MRN: [id number] Date of Birth: 1971/07/28  Transition of Care [facility]) CM/SW Contact:  Adrian Prows, RN Phone Number: 12/14/2023, 11:16 AM   Clinical Narrative:    D/C and nebulizer orders received; spoke w/ pt in room; he agrees to receive DME; he does not have an agency preference; spoke w/ Vaughan Basta at [place]; nebulizer will be delivered to pt's room; pt notified; no TOC needs.   Final next level of care: Home/Self Care Barriers to Discharge: No Barriers Identified   Patient Goals and CMS Choice            Discharge Placement                       Discharge Plan and Services Additional resources added to the After Visit Summary for                  DME Arranged: Nebulizer/meds DME Agency: [employer] Date DME Agency Contacted: 12/14/23 Time DME Agency Contacted: 1115 Representative spoke with at DME Agency: Vaughan Basta HH Arranged: NA HH Agency: NA        Social Drivers of Health (SDOH) Interventions SDOH Screenings   Food Insecurity: No Food Insecurity (12/10/2023)  Housing: Low Risk  (12/10/2023)  Transportation Needs: No Transportation Needs (12/10/2023)  Utilities: Not At Risk (12/10/2023)  Alcohol Screen: Low Risk  (02/13/2023)  Depression (PHQ2-9): Low Risk  (05/29/2023)  Recent Concern: Depression (PHQ2-9) - Medium Risk (04/29/2023)  Financial Resource Strain: Low Risk  (03/22/2022)  Physical Activity: Sufficiently Active (08/21/2023)  Social Connections: Socially Isolated (08/21/2023)  Stress: No Stress Concern Present (08/21/2023)  Tobacco Use: High Risk (12/09/2023)     Readmission Risk Interventions    12/12/2023   12:52 PM  Readmission Risk Prevention Plan  Transportation Screening Complete  Medication Review (RN Care Manager) Complete  PCP or Specialist appointment within 3-5 days of discharge Complete  HRI or Home Care Consult  Complete  SW Recovery Care/Counseling Consult Complete  Palliative Care Screening Not Applicable  Skilled Nursing Facility Not Applicable

## 2023-12-14 NOTE — Plan of Care (Signed)

## 2023-12-15 ENCOUNTER — Telehealth: Payer: Self-pay

## 2023-12-15 LAB — CULTURE, BLOOD (ROUTINE X 2)
Culture: NO GROWTH
Culture: NO GROWTH
Special Requests: ADEQUATE
Special Requests: ADEQUATE

## 2023-12-15 NOTE — Transitions of Care (Post Inpatient/ED Visit) (Signed)
   12/15/2023  Name: Joe Lawrence MRN: [id number] DOB: 1971-06-09  Today's TOC FU Call Status: Today's TOC FU Call Status:: Unsuccessful Call (1st Attempt) Unsuccessful Call (1st Attempt) Date: 12/15/23  Attempted to reach the patient regarding the most recent Inpatient/ED visit.  Follow Up Plan: Additional outreach attempts will be made to reach the patient to complete the Transitions of Care (Post Inpatient/ED visit) call.   Signature Karena Addison, LPN [facility] Nurse Health Advisor Direct Dial [phone]

## 2023-12-15 NOTE — Transitions of Care (Post Inpatient/ED Visit) (Signed)
12/15/2023  Name: Joe Lawrence MRN: [id number] DOB: August 31, 1971  Today's TOC FU Call Status: Today's TOC FU Call Status:: Successful TOC FU Call Completed Unsuccessful Call (1st Attempt) Date: 12/15/23 [facility] FU Call Complete Date: 12/15/23 Patient's Name and Date of Birth confirmed.  Transition Care Management Follow-up Telephone Call Date of Discharge: 12/14/23 Discharge Facility: [facility]) Type of Discharge: Inpatient Admission Primary Inpatient Discharge Diagnosis:: [facility] How have you been since you were released from the hospital?: Better Any questions or concerns?: No  Items Reviewed: Did you receive and understand the discharge instructions provided?: No Medications obtained,verified, and reconciled?: Yes (Medications Reviewed) Any new allergies since your discharge?: No Dietary orders reviewed?: Yes Do you have support at home?: Yes People in Home: parent(s)  Medications Reviewed Today: Medications Reviewed Today     Reviewed by Karena Addison, LPN (Licensed Practical Nurse) on 12/15/23 at 1530  Med List Status: <None>   Medication Order Taking? Sig Documenting Provider Last Dose Status Informant  amoxicillin-clavulanate (AUGMENTIN) 875-125 MG tablet [id number]  Take 1 tablet by mouth 2 (two) times daily for 3 days. Sheikh, Omair Latif, DO  Active   Buprenorphine HCl-Naloxone HCl (SUBOXONE) 8-2 MG FILM [id number] No Place 0.5 each under the tongue 5 (five) times daily. [provider] 12/09/2023 Active Self, Pharmacy Records  cloNIDine (CATAPRES) 0.1 MG tablet [id number] No Take 0.5-1 tablets by mouth 4 (four) times daily as needed (anxiety). [provider] 12/09/2023 Active Self, Pharmacy Records  escitalopram (LEXAPRO) 20 MG tablet [id number] No Take 1 tablet (20 mg total) by mouth daily. Starleen Blue, NP 12/09/2023 Active Self, Pharmacy Records  ferrous sulfate 325 (65 FE) MG EC tablet [id number] No Take 325 mg by mouth in the morning  and at bedtime. [provider] 12/09/2023 Active Self, Pharmacy Records  gabapentin (NEURONTIN) 300 MG capsule [id number] No Take 2 capsules (600 mg total) by mouth 4 (four) times daily. Starleen Blue, NP 12/09/2023 Active Self, Pharmacy Records  guaiFENesin (MUCINEX) 600 MG 12 hr tablet [id number]  Take 1 tablet (600 mg total) by mouth 2 (two) times daily for 5 days. Sheikh, Omair [place], DO  Active   hydrOXYzine (VISTARIL) 50 MG capsule [id number] No Take 50 mg by mouth as needed for anxiety. [provider] unk Active Self, Pharmacy Records           Med Note (CRUTHIS, CHLOE C   Wed Dec 10, 2023  8:52 AM) Pt is unsure of last dose.   levalbuterol (XOPENEX HFA) 45 MCG/ACT inhaler [id number]  Inhale 1 puff into the lungs as needed for wheezing or shortness of breath. Sheikh, Omair [place], [place]  Active   levalbuterol Pauline Aus) 0.63 MG/3ML nebulizer solution [id number]  Take 3 mLs (0.63 mg total) by nebulization every 4 (four) hours as needed for wheezing or shortness of breath. Sheikh, Omair [place], [place]  Active   loratadine (CLARITIN) 10 MG tablet [id number] No Take 10 mg by mouth daily. [provider] 12/09/2023 Active Self, Pharmacy Records  olopatadine (PATADAY) 0.1 % ophthalmic solution [id number] No Place 1 drop into both eyes daily as needed (itching).  Patient taking differently: Place 1 drop into both eyes as needed (itching/dry eye).   Starleen Blue, NP 12/08/2023 Active Self, Pharmacy Records  ondansetron (ZOFRAN) 4 MG tablet [id number] No Take 4 mg by mouth as needed for nausea or vomiting. [provider] 12/09/2023 Active Self, Pharmacy Records  OVER THE COUNTER MEDICATION [id number] No Take 1-2 capsules by mouth with breakfast, with lunch,  and with evening meal. Enzymatica digestive enzymes [provider] 12/09/2023 Active Self, Pharmacy Records  pantoprazole (PROTONIX) 40 MG tablet [id number] No Take 1 tablet (40 mg total) by mouth 2 (two) times daily.  Ronnald Nian, MD 12/09/2023 Active Self, Pharmacy Records  prazosin (MINIPRESS) 1 MG capsule [id number] No Take 1 mg by mouth at bedtime. [provider] 12/08/2023 Active Self, Pharmacy Records  QUEtiapine (SEROQUEL) 50 MG tablet [id number] No Take 1 tablet (50 mg total) by mouth 2 (two) times daily.  Patient taking differently: Take 50-150 mg by mouth See admin instructions. Take 50 mg by mouth three times daily. Take 150 mg by mouth at bedtime.   Starleen Blue, NP 12/09/2023 Active Self, Pharmacy Records           Med Note (CRUTHIS, Marcy Siren   Wed Dec 10, 2023  8:52 AM) Pt is adamant this is how he is taking this medication daily.   XIIDRA 5 % SOLN [id number] No Place 1 drop into both eyes 2 (two) times daily. [provider] 12/09/2023 Active Self, Pharmacy Records            Home Care and Equipment/Supplies: Were Home Health Services Ordered?: NA Any new equipment or medical supplies ordered?: NA  Functional Questionnaire: Do you need assistance with bathing/showering or dressing?: No Do you need assistance with meal preparation?: No Do you need assistance with eating?: No Do you have difficulty maintaining continence: No Do you need assistance with getting out of bed/getting out of a chair/moving?: No Do you have difficulty managing or taking your medications?: No  Follow up appointments reviewed: PCP Follow-up appointment confirmed?: Yes Date of PCP follow-up appointment?: 12/22/23 Follow-up Provider: [facility] Follow-up appointment confirmed?: NA Do you need transportation to your follow-up appointment?: No Do you understand care options if your condition(s) worsen?: Yes-patient verbalized understanding    SIGNATURE Karena Addison, LPN [facility] Nurse Health Advisor Direct Dial [phone]

## 2023-12-22 ENCOUNTER — Encounter: Payer: Self-pay | Admitting: Family Medicine

## 2023-12-22 ENCOUNTER — Ambulatory Visit (INDEPENDENT_AMBULATORY_CARE_PROVIDER_SITE_OTHER): Payer: Medicare Other | Admitting: Family Medicine

## 2023-12-22 VITALS — BP 124/78 | HR 99 | Ht 70.0 in | Wt 195.4 lb

## 2023-12-22 DIAGNOSIS — J189 Pneumonia, unspecified organism: Secondary | ICD-10-CM

## 2023-12-22 NOTE — Progress Notes (Signed)
   Subjective:    Patient ID: Joe Lawrence, male    DOB: [birth year], 52 y.o.   MRN: [id number]  HPI He is here for follow-up visit after recent hospitalization and treated for multilobular pneumonia.  He did have acute respiratory failure initially and there is also question about his cardiac status.  The echocardiogram was normal.  He was evaluated to determine exact cause of the pneumonias and all test were negative including mycoplasma.  He was stabilized and sent home on Xopenex however he is only had to use at lunch.  He has had no difficulty with fever, chills, shortness of breath.  He is now back on his regular medications.   Review of Systems     Objective:    Physical Exam Alert and in no distress. Tympanic membranes and canals are normal. Pharyngeal area is normal. Neck is supple without adenopathy or thyromegaly. Cardiac exam shows a regular sinus rhythm without murmurs or gallops. Lungs are clear to auscultation.        Assessment & Plan:  Multifocal pneumonia - Plan: CBC with Differential/Platelet, Comprehensive metabolic panel, Phosphorus, Magnesium Encouraged him to get back on his regular medications and if he develops fever, chills, shortness of breath.  I will also set him up for repeat chest x-ray

## 2023-12-23 LAB — COMPREHENSIVE METABOLIC PANEL
ALT: 11 [IU]/L (ref 0–44)
AST: 20 [IU]/L (ref 0–40)
Albumin: 4.1 g/dL (ref 3.8–4.9)
Alkaline Phosphatase: 70 [IU]/L (ref 44–121)
BUN/Creatinine Ratio: 13 (ref 9–20)
BUN: 14 mg/dL (ref 6–24)
Bilirubin Total: <0.2 mg/dL (ref 0.0–1.2)
CO2: 22 mmol/L (ref 20–29)
Calcium: 9.3 mg/dL (ref 8.7–10.2)
Chloride: 102 mmol/L (ref 96–106)
Creatinine, Ser: 1.09 mg/dL (ref 0.76–1.27)
Globulin, Total: 2.4 g/dL (ref 1.5–4.5)
Glucose: 78 mg/dL (ref 70–99)
Potassium: 4.5 mmol/L (ref 3.5–5.2)
Sodium: 139 mmol/L (ref 134–144)
Total Protein: 6.5 g/dL (ref 6.0–8.5)
eGFR: 81 mL/min/{1.73_m2} (ref >59)

## 2023-12-23 LAB — CBC WITH DIFFERENTIAL/PLATELET
Basophils Absolute: 0.1 10*3/uL (ref 0.0–0.2)
Basos: 2 %
EOS (ABSOLUTE): 0.1 10*3/uL (ref 0.0–0.4)
Eos: 4 %
Hematocrit: 38.6 % (ref 37.5–51.0)
Hemoglobin: 12 g/dL — ABNORMAL LOW (ref 13.0–17.7)
Immature Grans (Abs): 0 10*3/uL (ref 0.0–0.1)
Immature Granulocytes: 0 %
Lymphocytes Absolute: 1.5 10*3/uL (ref 0.7–3.1)
Lymphs: 48 %
MCH: 28 pg (ref 26.6–33.0)
MCHC: 31.1 g/dL — ABNORMAL LOW (ref 31.5–35.7)
MCV: 90 fL (ref 79–97)
Monocytes Absolute: 0.5 10*3/uL (ref 0.1–0.9)
Monocytes: 16 %
Neutrophils Absolute: 0.9 10*3/uL — ABNORMAL LOW (ref 1.4–7.0)
Neutrophils: 30 %
Platelets: 369 10*3/uL (ref 150–450)
RBC: 4.28 x10E6/uL (ref 4.14–5.80)
RDW: 14.2 % (ref 11.6–15.4)
WBC: 3.1 10*3/uL — ABNORMAL LOW (ref 3.4–10.8)

## 2023-12-23 LAB — MAGNESIUM: Magnesium: 2.4 mg/dL — ABNORMAL HIGH (ref 1.6–2.3)

## 2023-12-23 LAB — SPECIMEN STATUS REPORT

## 2023-12-23 LAB — PHOSPHORUS: Phosphorus: 4.4 mg/dL — ABNORMAL HIGH (ref 2.8–4.1)

## 2024-02-19 ENCOUNTER — Telehealth: Payer: Self-pay

## 2024-02-19 ENCOUNTER — Ambulatory Visit (INDEPENDENT_AMBULATORY_CARE_PROVIDER_SITE_OTHER): Payer: Medicare Other | Admitting: Family Medicine

## 2024-02-19 ENCOUNTER — Encounter: Payer: Self-pay | Admitting: Family Medicine

## 2024-02-19 VITALS — BP 130/78 | HR 90 | Wt 207.4 lb

## 2024-02-19 DIAGNOSIS — Z5181 Encounter for therapeutic drug level monitoring: Secondary | ICD-10-CM

## 2024-02-19 MED ORDER — GABAPENTIN 600 MG PO TABS: 600.0000 mg | ORAL_TABLET | Freq: Four times a day (QID) | ORAL | 0 refills | Status: AC

## 2024-02-19 NOTE — Telephone Encounter (Signed)
 Copied from CRM [id number]. Topic: Appointments - Appointment Scheduling >> Feb 18, 2024  2:36 PM Dondra Prader E wrote: Patient/patient representative is calling to schedule an appointment. Refer to attachments for appointment information.

## 2024-02-19 NOTE — Progress Notes (Signed)
   Subjective:    Patient ID: Joe Lawrence, male    DOB: 06-28-1971, 53 y.o.   MRN: [id number]  HPI He is here for a medication management visit.  He has been getting his psychotropic medications including Suboxone at [facility] but apparently the nurse practitioner is leaving and he is wondering if I could write his prescriptions.   Review of Systems     Objective:    Physical Exam Alert and in no distress otherwise not examined       Assessment & Plan:  Medication monitoring encounter I reviewed the medications that he is on and recommend that he follow-up with them since the prescriptions were written through that particular clinic.  Apparently 1 practitioner was right in for Suboxone and the other prescriptions were written by someone else in the practice.  I recommend he discuss this especially for follow-up purposes.  I explained that I did not think appropriate for me to write for these medications although I did give him a 1 month supply of Wellbutrin since he had run out of that already.

## 2024-02-24 ENCOUNTER — Encounter: Payer: Self-pay | Admitting: Internal Medicine

## 2024-02-24 ENCOUNTER — Ambulatory Visit: Payer: Medicare Other | Admitting: Family Medicine

## 2024-03-02 ENCOUNTER — Ambulatory Visit (INDEPENDENT_AMBULATORY_CARE_PROVIDER_SITE_OTHER): Payer: Medicare Other | Admitting: Family Medicine

## 2024-03-02 ENCOUNTER — Encounter: Payer: Self-pay | Admitting: Family Medicine

## 2024-03-02 VITALS — BP 120/72 | HR 78 | Wt 205.2 lb

## 2024-03-02 DIAGNOSIS — Z8701 Personal history of pneumonia (recurrent): Secondary | ICD-10-CM | POA: Diagnosis not present

## 2024-03-02 NOTE — Progress Notes (Signed)
   Subjective:    Patient ID: Joe Lawrence, male    DOB: 1971-05-20, 53 y.o.   MRN: [id number]  HPI He is here for follow-up after recent diagnosis and treatment of pneumonia which did show multifocal areas of concern.  He still does complain of some slight shortness of breath but has been checking his pulse ox and it usually runs in the 94 or above range.  No fever chills or coughing.  No weight loss noted. At the end of the encounter he did mention some swelling and possible infection in his right ankle.  He also noted some slight edema in his lower extremities.  Review of Systems     Objective:    Physical Exam Alert and in no distress.  Cardiac exam shows regular rhythm without murmurs or gallops.  Lungs are clear to auscultation. Exam of the right ankle shows a very discrete area of erythema in the retrocalcaneal area.  It was slightly tender to palpation but did not feel hot.      Assessment & Plan:  History of pneumonia - Plan: DG Chest 2 View Think it is reasonable to see what the x-ray looks like since he did have multifocal pneumonia.  I discussed the ankle issue with him and explained that I did not feel that it was an infection and preferred to monitor the situation

## 2024-03-03 ENCOUNTER — Ambulatory Visit: Payer: Self-pay | Admitting: Family Medicine

## 2024-03-03 ENCOUNTER — Ambulatory Visit (INDEPENDENT_AMBULATORY_CARE_PROVIDER_SITE_OTHER): Admitting: Family Medicine

## 2024-03-03 ENCOUNTER — Encounter: Payer: Self-pay | Admitting: Family Medicine

## 2024-03-03 VITALS — BP 138/86 | HR 84 | Temp 98.2°F | Ht 70.0 in | Wt 209.0 lb

## 2024-03-03 DIAGNOSIS — R600 Localized edema: Secondary | ICD-10-CM | POA: Diagnosis not present

## 2024-03-03 DIAGNOSIS — Z5181 Encounter for therapeutic drug level monitoring: Secondary | ICD-10-CM

## 2024-03-03 DIAGNOSIS — D508 Other iron deficiency anemias: Secondary | ICD-10-CM | POA: Diagnosis not present

## 2024-03-03 DIAGNOSIS — M25571 Pain in right ankle and joints of right foot: Secondary | ICD-10-CM

## 2024-03-03 MED ORDER — FUROSEMIDE 20 MG PO TABS: 10.0000 mg | ORAL_TABLET | Freq: Every day | ORAL | 0 refills | Status: DC | PRN

## 2024-03-03 NOTE — Addendum Note (Signed)
 Addended by: Ronnald Nian on: 03/03/2024 01:32 PM   Modules accepted: Level of Service

## 2024-03-03 NOTE — Telephone Encounter (Signed)
 Copied from CRM [id number]. Topic: Clinical - Red Word Triage >> Mar 03, 2024 10:10 AM Tiffany B wrote: Red Word that prompted transfer to Nurse Triage: Patient states he was seen 03/02/2024 and ankle pain has worsened,patient states both ankles are swollen, red and painful to walk.    Chief Complaint: Leg pain Symptoms: Leg pain, swelling, redness Frequency: Ongoing for 5 days Pertinent Negatives: Patient denies numbness, tingling, fever, chest pain, SOB Disposition: [] ED /[] Urgent Care (no appt availability in office) / [x] Appointment(In office/virtual)/ []  [facility]/ [] Home Care/ [] Refused Recommended Disposition /[] [facility]/ []  Follow-up with PCP Additional Notes: Patient stated that he is having bilateral leg swelling and pain in the right leg. He also stated he has leg redness and legs are hot to touch. He was seen in the office yesterday by his PCP. Patient stated he did not receive any diagnosis and the provider did not suspect cellulitis. He stated the provider just recommended for him to apply ice. Patient stated he has been applying ice without any improvement and he feels like his symptoms are worse today. Appointment scheduled for this afternoon.   Reason for Disposition  [1] Thigh, calf, or ankle swelling AND [2] bilateral AND [3] 1 side is more swollen  Answer Assessment - Initial Assessment Questions 1. ONSET: "When did the pain start?"      5 days ago and worsened today  2. LOCATION: "Where is the pain located?"      R leg  3. PAIN: "How bad is the pain?"    (Scale 1-10; or mild, moderate, severe)   -  MILD (1-3): doesn't interfere with normal activities    -  MODERATE (4-7): interferes with normal activities (e.g., work or school) or awakens from sleep, limping    -  SEVERE (8-10): excruciating pain, unable to do any normal activities, unable to walk     6/10. Patient able to walk but it is uncomfortable   4. CAUSE: "What do you think is causing  the leg pain?"     Patient thinks he might have cellulitis  5. OTHER SYMPTOMS: "Do you have any other symptoms?" (e.g., chest pain, back pain, breathing difficulty, swelling, rash, fever, numbness, weakness)     Both legs hot to touch (worse today), swelling, redness  Protocols used: Leg Pain-A-AH

## 2024-03-03 NOTE — Patient Instructions (Signed)
 Please cut out the chicken broth. If broth is needed, be sure to use a low sodium broth. I think this is contributing to the symmetric swelling in both legs. Your blood pressure is higher than usual today--this could be related to pain, nerves, or from the sodium. Your weight was up 4 pounds from yesterday.  This is related to the swelling in your legs.  Take furosemide once daily (in the morning) just for the next couple of days until your swelling is better. I'm going to give you extra pills to have on hand, to use in the future if the swelling recurs. It is a good idea to eat a banana or potato on the days you take this medication, as this diuretic can cause you to lose potassium through your urine.  I recommend icing the swollen, red area of your ankle. Definitely wear compression socks every day, and this will give some extra support to the ankle. You don't need to wear them at night. Put them on first thing in the morning. You can use an over-the-counter hydrocortisone cream 2-3 times daily to the itchy area of the ankle, if needed.  The blood test will let us know if there is any infection. If your redness continues to spread, if you develop fever, more swelling and more pain, please return for re-evaluation.

## 2024-03-03 NOTE — Progress Notes (Signed)
 Chief Complaint  Patient presents with   Leg Pain    B/L leg pain and swelling. Worsened since yesterday. Weight yesterday here was 205. Hgb was 12 in Dec, should he take iron?   R ankle has been red and swollen for 5 days. It has been getting worse. The redness is mainly laterally. He reports some itching in this area as well. Discomfort from the right lateral ankle is now extending to the lower lateral shin (extends above the ankle). He has swelling in both legs, now seeing more at the R lateral ankle also. L leg has always been a little more swollen than the right. He has had swelling off/on for the last 6 months.  He doesn't add salt to his food. He makes his own soups, uses regular broth (not low sodium). Also uses broth when he cooks his rice and chicken. No soy sauce, no lunch meats, no processed meats, no olives. No french fries or mashed potatoes. Eats cereals.  He wears compression socks all the time (at night also), swelling recurs when he doesn't wear them. Not wearing them today.  Asking if he should take iron, based on prior levels. He has been taking some since low iron levels in November.  He ran out for about a week, restarted last week. He takes Mg glycinate--just bought a new bottle and started last week.  Had stopped taking it due to higher levels in the past.  He was taking it for muscle aches, sometimes has restless legs.  Labs reviewed-- 12/23 Lab Results  Component Value Date   WBC 3.1 (L) 12/22/2023   HGB 12.0 (L) 12/22/2023   HCT 38.6 12/22/2023   MCV 90 12/22/2023   PLT 369 12/22/2023   Mg 2.4 Phos 4.4    Chemistry      Component Value Date/Time   NA 139 12/22/2023 1208   K 4.5 12/22/2023 1208   CL 102 12/22/2023 1208   CO2 22 12/22/2023 1208   BUN 14 12/22/2023 1208   CREATININE 1.09 12/22/2023 1208   CREATININE 1.06 02/03/2017 1200      Component Value Date/Time   CALCIUM 9.3 12/22/2023 1208   ALKPHOS 70 12/22/2023 1208   AST 20 12/22/2023  1208   ALT 11 12/22/2023 1208   BILITOT <0.2 12/22/2023 1208     Glu 78  Last iron studies: Lab Results  Component Value Date   IRON 22 (L) 11/06/2023   TIBC 294 11/06/2023   FERRITIN 65 11/06/2023     PMH, PSH, SH reviewed  Outpatient Encounter Medications as of 03/03/2024  Medication Sig Note   Buprenorphine HCl-Naloxone HCl (SUBOXONE) 8-2 MG FILM Place 0.5 each under the tongue 5 (five) times daily.    cloNIDine (CATAPRES) 0.1 MG tablet Take 0.5-1 tablets by mouth 4 (four) times daily as needed (anxiety).    escitalopram (LEXAPRO) 20 MG tablet Take 1 tablet (20 mg total) by mouth daily.    ferrous sulfate 325 (65 FE) MG EC tablet Take 325 mg by mouth in the morning and at bedtime. 03/03/2024: Started again yesterday   gabapentin (NEURONTIN) 600 MG tablet Take 1 tablet (600 mg total) by mouth in the morning, at noon, in the evening, and at bedtime.    hydrOXYzine (VISTARIL) 50 MG capsule Take 50 mg by mouth as needed for anxiety. 03/03/2024: 2-3 times daily, usually times   levalbuterol (XOPENEX HFA) 45 MCG/ACT inhaler Inhale 1 puff into the lungs as needed for wheezing or shortness of breath.  03/03/2024: Used this am   olopatadine (PATADAY) 0.1 % ophthalmic solution Place 1 drop into both eyes daily as needed (itching).    OVER THE COUNTER MEDICATION Take 1-2 capsules by mouth with breakfast, with lunch, and with evening meal. Enzymatica digestive enzymes    pantoprazole (PROTONIX) 40 MG tablet Take 1 tablet (40 mg total) by mouth 2 (two) times daily.    prazosin (MINIPRESS) 2 MG capsule Take 2 mg by mouth daily.    QUEtiapine (SEROQUEL) 50 MG tablet Take 1 tablet (50 mg total) by mouth 2 (two) times daily. (Patient taking differently: Take 50-150 mg by mouth See admin instructions. Take 50 mg by mouth three times daily. Take 150 mg by mouth at bedtime.) 03/03/2024: 1 TID and 3 qhs   XIIDRA 5 % SOLN Place 1 drop into both eyes 2 (two) times daily.    [DISCONTINUED] prazosin (MINIPRESS) 1 MG  capsule Take 1 mg by mouth at bedtime. 03/03/2024: 2mg  at bedtime   loratadine (CLARITIN) 10 MG tablet Take 10 mg by mouth daily. (Patient not taking: Reported on 03/03/2024) 03/03/2024: As needed, seasonally   ondansetron (ZOFRAN) 4 MG tablet Take 4 mg by mouth as needed for nausea or vomiting. (Patient not taking: Reported on 03/02/2024) 03/03/2024: As needed   [DISCONTINUED] levalbuterol (XOPENEX) 0.63 MG/3ML nebulizer solution Take 3 mLs (0.63 mg total) by nebulization every 4 (four) hours as needed for wheezing or shortness of breath. (Patient not taking: Reported on 03/03/2024) 12/22/2023: Insurance will not fill.    No facility-administered encounter medications on file as of 03/03/2024.   Allergies  Allergen Reactions   Celexa [Citalopram] Other (See Comments)    Muscle tighness   Chlorpromazine Hcl Other (See Comments)    Pt does not remember this allergy.  May be 20 years ago.   Iodine Other (See Comments)    Skin has burning sensation.   Sulfonamide Derivatives Other (See Comments)    Patient does not know of this allergy or the reaction.   Voltaren [Diclofenac Sodium] Other (See Comments)    voltaren has caused his arm to swell in the past, does not remember when this happened.    ROS: no f/c. No n/v/d. No URI symptoms, chest pain. No bleeding, bruising or rashes (other than the redness at right lateral ankle). Significantly improved from recent pneumonia. Needed to use xopenex today d/t the rain, helps. Bilateral leg swelling per HPI. See HPI    PHYSICAL EXAM:  BP 138/86   Pulse 84   Temp 98.2 F (36.8 C) (Tympanic)   Ht 5\' 10"  (1.778 m)   Wt 209 lb (94.8 kg)   BMI 29.99 kg/m   Wt Readings from Last 3 Encounters:  03/03/24 209 lb (94.8 kg)  03/02/24 205 lb 3.2 oz (93.1 kg)  02/19/24 207 lb 6.4 oz (94.1 kg)   Pleasant male, somewhat anxious, in no distress HEENT: conjunctiva and sclera are clear, EOMI. Neck: no lymphadenopathy or mass Heart: regular rate and  rhythm Lungs: clear bilaterally Abdomen: soft, nontender, no mass Extremities: 2+ pulses 1+ pretibial edema bilaterally Erythema and warmth at the R lateral ankle, inferior to the malleolus. Skin is intact, no bites or wounds. Some dryness to the heel, but no cracking/redness/pain. Erythema starts above this area. L great toenail--distal portion has come off and is irregular. Base of the nail is normal (about 3-4 months of normal growth). Neuro: alert and oriented, grossly normal cranial nerves, strength. Psych: mildly anxious, full range of affect. Normal grooming  ASSESSMENT/PLAN:  Lower extremity edema - intermittent for a few mos. Ddx reviewed. Cut out broth, low Na diet, compression socks. Lasix prn. f/u 1-2 wks if not better - Plan: furosemide (LASIX) 20 MG tablet  Right ankle pain, unspecified chronicity - some focal swelling, redness, itching. Not c/w cellulitis. ?related to stasis/swelling. Rec ice, compression, elevation, HC prn itching. f/u if worse  Hypermagnesemia - previously elevated levels, back on supplements. Check level (and advise to stop if high) - Plan: Magnesium  Other iron deficiency anemia - Has been taking iron for 3-4 months. Recheck labs. Unclear if he needs to continue on iron. - Plan: CBC with Differential/Platelet, Iron, TIBC and Ferritin Panel  Hyperphosphatemia - notedon last check, unclear why. Will repeat with labs - Plan: Phosphorus  Medication monitoring encounter - Plan: Magnesium, CBC with Differential/Platelet, Iron, TIBC and Ferritin Panel, Phosphorus  Risks/SE of lasix reviewed, dietary K+ discussed. Proper use of compression socks reviewed. F/u 1-2 weeks if not improving, sooner if worse (fever, increased spread of redness, pain).   Cbc, iron panel, mg, phos  I spent 50 minutes dedicated to the care of this patient, including pre-visit review of records, face to face time, post-visit ordering of testing and documentation.   Please cut  out the chicken broth. If broth is needed, be sure to use a low sodium broth. I think this is contributing to the symmetric swelling in both legs. Your blood pressure is higher than usual today--this could be related to pain, nerves, or from the sodium. Your weight was up 4 pounds from yesterday.  This is related to the swelling in your legs.  Take furosemide once daily (in the morning) just for the next couple of days until your swelling is better. I'm going to give you extra pills to have on hand, to use in the future if the swelling recurs. It is a good idea to eat a banana or potato on the days you take this medication, as this diuretic can cause you to lose potassium through your urine.  I recommend icing the swollen, red area of your ankle. Definitely wear compression socks every day, and this will give some extra support to the ankle. You don't need to wear them at night. Put them on first thing in the morning. You can use an over-the-counter hydrocortisone cream 2-3 times daily to the itchy area of the ankle, if needed.  The blood test will let us know if there is any infection. If your redness continues to spread, if you develop fever, more swelling and more pain, please return for re-evaluation.

## 2024-03-04 ENCOUNTER — Other Ambulatory Visit

## 2024-03-04 ENCOUNTER — Ambulatory Visit
Admission: RE | Admit: 2024-03-04 | Discharge: 2024-03-04 | Disposition: A | Discharge status: Home / Self Care | Source: Ambulatory Visit | Attending: Family Medicine | Admitting: Family Medicine

## 2024-03-04 DIAGNOSIS — D508 Other iron deficiency anemias: Secondary | ICD-10-CM

## 2024-03-04 DIAGNOSIS — Z5181 Encounter for therapeutic drug level monitoring: Secondary | ICD-10-CM

## 2024-03-05 ENCOUNTER — Encounter: Payer: Self-pay | Admitting: Family Medicine

## 2024-03-05 LAB — CBC WITH DIFFERENTIAL/PLATELET
Basophils Absolute: 0 10*3/uL (ref 0.0–0.2)
Basos: 1 %
EOS (ABSOLUTE): 0.1 10*3/uL (ref 0.0–0.4)
Eos: 4 %
Hematocrit: 39.4 % (ref 37.5–51.0)
Hemoglobin: 12.8 g/dL — ABNORMAL LOW (ref 13.0–17.7)
Immature Grans (Abs): 0 10*3/uL (ref 0.0–0.1)
Immature Granulocytes: 0 %
Lymphocytes Absolute: 1.3 10*3/uL (ref 0.7–3.1)
Lymphs: 40 %
MCH: 28 pg (ref 26.6–33.0)
MCHC: 32.5 g/dL (ref 31.5–35.7)
MCV: 86 fL (ref 79–97)
Monocytes Absolute: 0.5 10*3/uL (ref 0.1–0.9)
Monocytes: 15 %
Neutrophils Absolute: 1.3 10*3/uL — ABNORMAL LOW (ref 1.4–7.0)
Neutrophils: 40 %
Platelets: 244 10*3/uL (ref 150–450)
RBC: 4.57 x10E6/uL (ref 4.14–5.80)
RDW: 12.3 % (ref 11.6–15.4)
WBC: 3.3 10*3/uL — ABNORMAL LOW (ref 3.4–10.8)

## 2024-03-05 LAB — IRON,TIBC AND FERRITIN PANEL
Ferritin: 56 ng/mL (ref 30–400)
Iron Saturation: 17 % (ref 15–55)
Iron: 48 ug/dL (ref 38–169)
Total Iron Binding Capacity: 286 ug/dL (ref 250–450)
UIBC: 238 ug/dL (ref 111–343)

## 2024-03-05 LAB — PHOSPHORUS: Phosphorus: 3.7 mg/dL (ref 2.8–4.1)

## 2024-03-05 LAB — MAGNESIUM: Magnesium: 2.1 mg/dL (ref 1.6–2.3)

## 2024-03-09 ENCOUNTER — Encounter: Payer: Self-pay | Admitting: Family Medicine

## 2024-03-26 ENCOUNTER — Emergency Department (HOSPITAL_COMMUNITY)

## 2024-03-26 ENCOUNTER — Inpatient Hospital Stay (HOSPITAL_COMMUNITY)
Admission: EM | Admit: 2024-03-26 | Discharge: 2024-03-29 | DRG: 193 | Disposition: A | Discharge status: Home / Self Care | Attending: Internal Medicine | Admitting: Internal Medicine

## 2024-03-26 ENCOUNTER — Encounter (HOSPITAL_COMMUNITY): Payer: Self-pay | Admitting: Emergency Medicine

## 2024-03-26 ENCOUNTER — Other Ambulatory Visit: Payer: Self-pay

## 2024-03-26 DIAGNOSIS — R9431 Abnormal electrocardiogram [ECG] [EKG]: Secondary | ICD-10-CM | POA: Diagnosis not present

## 2024-03-26 DIAGNOSIS — Z811 Family history of alcohol abuse and dependence: Secondary | ICD-10-CM

## 2024-03-26 DIAGNOSIS — F1120 Opioid dependence, uncomplicated: Secondary | ICD-10-CM | POA: Diagnosis present

## 2024-03-26 DIAGNOSIS — Z9151 Personal history of suicidal behavior: Secondary | ICD-10-CM

## 2024-03-26 DIAGNOSIS — F32A Depression, unspecified: Secondary | ICD-10-CM | POA: Diagnosis present

## 2024-03-26 DIAGNOSIS — F17210 Nicotine dependence, cigarettes, uncomplicated: Secondary | ICD-10-CM | POA: Diagnosis present

## 2024-03-26 DIAGNOSIS — J4531 Mild persistent asthma with (acute) exacerbation: Secondary | ICD-10-CM | POA: Diagnosis present

## 2024-03-26 DIAGNOSIS — Z888 Allergy status to other drugs, medicaments and biological substances status: Secondary | ICD-10-CM

## 2024-03-26 DIAGNOSIS — F1010 Alcohol abuse, uncomplicated: Secondary | ICD-10-CM | POA: Diagnosis present

## 2024-03-26 DIAGNOSIS — F418 Other specified anxiety disorders: Secondary | ICD-10-CM | POA: Diagnosis present

## 2024-03-26 DIAGNOSIS — J9601 Acute respiratory failure with hypoxia: Secondary | ICD-10-CM | POA: Diagnosis present

## 2024-03-26 DIAGNOSIS — E871 Hypo-osmolality and hyponatremia: Secondary | ICD-10-CM | POA: Diagnosis present

## 2024-03-26 DIAGNOSIS — J189 Pneumonia, unspecified organism: Secondary | ICD-10-CM | POA: Diagnosis not present

## 2024-03-26 DIAGNOSIS — F1011 Alcohol abuse, in remission: Secondary | ICD-10-CM | POA: Diagnosis present

## 2024-03-26 DIAGNOSIS — Z720 Tobacco use: Secondary | ICD-10-CM

## 2024-03-26 DIAGNOSIS — Z1152 Encounter for screening for COVID-19: Secondary | ICD-10-CM

## 2024-03-26 DIAGNOSIS — Z79899 Other long term (current) drug therapy: Secondary | ICD-10-CM

## 2024-03-26 DIAGNOSIS — F419 Anxiety disorder, unspecified: Secondary | ICD-10-CM | POA: Diagnosis present

## 2024-03-26 DIAGNOSIS — R0902 Hypoxemia: Secondary | ICD-10-CM | POA: Diagnosis not present

## 2024-03-26 DIAGNOSIS — Z882 Allergy status to sulfonamides status: Secondary | ICD-10-CM

## 2024-03-26 DIAGNOSIS — Z716 Tobacco abuse counseling: Secondary | ICD-10-CM

## 2024-03-26 LAB — RESP PANEL BY RT-PCR (RSV, FLU A&B, COVID)  RVPGX2
Influenza A by PCR: NEGATIVE
Influenza B by PCR: NEGATIVE
Resp Syncytial Virus by PCR: NEGATIVE
SARS Coronavirus 2 by RT PCR: NEGATIVE

## 2024-03-26 LAB — BASIC METABOLIC PANEL WITH GFR
Anion gap: 9 (ref 5–15)
BUN: 20 mg/dL (ref 6–20)
CO2: 26 mmol/L (ref 22–32)
Calcium: 8.9 mg/dL (ref 8.9–10.3)
Chloride: 96 mmol/L — ABNORMAL LOW (ref 98–111)
Creatinine, Ser: 0.95 mg/dL (ref 0.61–1.24)
GFR, Estimated: >60 mL/min (ref >60)
Glucose, Bld: 110 mg/dL — ABNORMAL HIGH (ref 70–99)
Potassium: 3.8 mmol/L (ref 3.5–5.1)
Sodium: 131 mmol/L — ABNORMAL LOW (ref 135–145)

## 2024-03-26 LAB — CBC
HCT: 39.3 % (ref 39.0–52.0)
Hemoglobin: 12.2 g/dL — ABNORMAL LOW (ref 13.0–17.0)
MCH: 27.5 pg (ref 26.0–34.0)
MCHC: 31 g/dL (ref 30.0–36.0)
MCV: 88.5 fL (ref 80.0–100.0)
Platelets: 227 10*3/uL (ref 150–400)
RBC: 4.44 MIL/uL (ref 4.22–5.81)
RDW: 12.8 % (ref 11.5–15.5)
WBC: 6.2 10*3/uL (ref 4.0–10.5)
nRBC: 0 % (ref 0.0–0.2)

## 2024-03-26 LAB — STREP PNEUMONIAE URINARY ANTIGEN: Strep Pneumo Urinary Antigen: NEGATIVE

## 2024-03-26 MED ORDER — SODIUM CHLORIDE 0.9 % IV SOLN
2.0000 g | INTRAVENOUS | Status: DC
  Administered 2024-03-27 – 2024-03-28 (×2): 2 g via INTRAVENOUS
  Filled 2024-03-26 (×2): qty 20

## 2024-03-26 MED ORDER — PANTOPRAZOLE SODIUM 40 MG PO TBEC
40.0000 mg | DELAYED_RELEASE_TABLET | Freq: Two times a day (BID) | ORAL | Status: DC
  Administered 2024-03-26 – 2024-03-29 (×6): 40 mg via ORAL
  Filled 2024-03-26 (×6): qty 1

## 2024-03-26 MED ORDER — SODIUM CHLORIDE 0.9 % IV SOLN: 500.0000 mg | INTRAVENOUS | Status: DC

## 2024-03-26 MED ORDER — ESCITALOPRAM OXALATE 20 MG PO TABS
20.0000 mg | ORAL_TABLET | Freq: Every day | ORAL | Status: DC
Start: 2024-03-27 — End: 2024-03-29
  Administered 2024-03-27 – 2024-03-29 (×3): 20 mg via ORAL
  Filled 2024-03-26 (×3): qty 1

## 2024-03-26 MED ORDER — SENNOSIDES-DOCUSATE SODIUM 8.6-50 MG PO TABS
1.0000 | ORAL_TABLET | Freq: Every evening | ORAL | Status: DC | PRN
  Administered 2024-03-28: 1 via ORAL
  Filled 2024-03-26: qty 1

## 2024-03-26 MED ORDER — BUPRENORPHINE HCL-NALOXONE HCL 2-0.5 MG SL SUBL
2.0000 | SUBLINGUAL_TABLET | Freq: Four times a day (QID) | SUBLINGUAL | Status: DC
  Administered 2024-03-26 – 2024-03-29 (×10): 2 via SUBLINGUAL
  Filled 2024-03-26 (×10): qty 2

## 2024-03-26 MED ORDER — CLONIDINE HCL 0.1 MG PO TABS
0.0500 mg | ORAL_TABLET | Freq: Four times a day (QID) | ORAL | Status: DC | PRN
  Administered 2024-03-26: 0.05 mg via ORAL
  Filled 2024-03-26: qty 1

## 2024-03-26 MED ORDER — SODIUM CHLORIDE 0.9 % IV SOLN
1.0000 g | Freq: Once | INTRAVENOUS | Status: AC
  Administered 2024-03-26: 1 g via INTRAVENOUS
  Filled 2024-03-26: qty 10

## 2024-03-26 MED ORDER — ONDANSETRON HCL 4 MG PO TABS: 4.0000 mg | ORAL_TABLET | Freq: Four times a day (QID) | ORAL | Status: DC | PRN

## 2024-03-26 MED ORDER — ALBUTEROL SULFATE (2.5 MG/3ML) 0.083% IN NEBU: 2.5000 mg | INHALATION_SOLUTION | RESPIRATORY_TRACT | Status: DC | PRN

## 2024-03-26 MED ORDER — ONDANSETRON HCL 4 MG/2ML IJ SOLN: 4.0000 mg | Freq: Four times a day (QID) | INTRAMUSCULAR | Status: DC | PRN

## 2024-03-26 MED ORDER — IPRATROPIUM-ALBUTEROL 0.5-2.5 (3) MG/3ML IN SOLN
3.0000 mL | Freq: Once | RESPIRATORY_TRACT | Status: AC
  Administered 2024-03-26: 3 mL via RESPIRATORY_TRACT
  Filled 2024-03-26: qty 3

## 2024-03-26 MED ORDER — HYDROXYZINE HCL 25 MG PO TABS
50.0000 mg | ORAL_TABLET | Freq: Three times a day (TID) | ORAL | Status: DC | PRN
  Administered 2024-03-26: 50 mg via ORAL
  Filled 2024-03-26: qty 2

## 2024-03-26 MED ORDER — GABAPENTIN 300 MG PO CAPS
600.0000 mg | ORAL_CAPSULE | Freq: Four times a day (QID) | ORAL | Status: DC
Start: 2024-03-26 — End: 2024-03-29
  Administered 2024-03-26 – 2024-03-29 (×10): 600 mg via ORAL
  Filled 2024-03-26 (×4): qty 2
  Filled 2024-03-26: qty 6
  Filled 2024-03-26 (×5): qty 2

## 2024-03-26 MED ORDER — QUETIAPINE FUMARATE 25 MG PO TABS
150.0000 mg | ORAL_TABLET | Freq: Every day | ORAL | Status: DC
  Administered 2024-03-26 – 2024-03-28 (×3): 150 mg via ORAL
  Filled 2024-03-26 (×3): qty 2

## 2024-03-26 MED ORDER — ALBUTEROL SULFATE (2.5 MG/3ML) 0.083% IN NEBU
2.5000 mg | INHALATION_SOLUTION | Freq: Four times a day (QID) | RESPIRATORY_TRACT | Status: DC | PRN
  Administered 2024-03-27: 2.5 mg via RESPIRATORY_TRACT
  Filled 2024-03-26: qty 3

## 2024-03-26 MED ORDER — ENOXAPARIN SODIUM 40 MG/0.4ML IJ SOSY
40.0000 mg | PREFILLED_SYRINGE | INTRAMUSCULAR | Status: DC
  Administered 2024-03-26 – 2024-03-28 (×3): 40 mg via SUBCUTANEOUS
  Filled 2024-03-26 (×3): qty 0.4

## 2024-03-26 MED ORDER — QUETIAPINE FUMARATE 25 MG PO TABS
50.0000 mg | ORAL_TABLET | Freq: Three times a day (TID) | ORAL | Status: DC
Start: 2024-03-27 — End: 2024-03-29
  Administered 2024-03-27 – 2024-03-29 (×7): 50 mg via ORAL
  Filled 2024-03-26 (×7): qty 2

## 2024-03-26 MED ORDER — ALBUTEROL SULFATE HFA 108 (90 BASE) MCG/ACT IN AERS: 2.0000 | INHALATION_SPRAY | RESPIRATORY_TRACT | Status: DC | PRN

## 2024-03-26 MED ORDER — SODIUM CHLORIDE 0.9 % IV SOLN
500.0000 mg | Freq: Once | INTRAVENOUS | Status: AC
  Administered 2024-03-26: 500 mg via INTRAVENOUS
  Filled 2024-03-26: qty 5

## 2024-03-26 MED ORDER — GUAIFENESIN ER 600 MG PO TB12
600.0000 mg | ORAL_TABLET | Freq: Two times a day (BID) | ORAL | Status: DC
  Administered 2024-03-26 – 2024-03-29 (×6): 600 mg via ORAL
  Filled 2024-03-26 (×6): qty 1

## 2024-03-26 MED ORDER — ACETAMINOPHEN 650 MG RE SUPP: 650.0000 mg | Freq: Four times a day (QID) | RECTAL | Status: DC | PRN

## 2024-03-26 MED ORDER — NICOTINE 7 MG/24HR TD PT24
7.0000 mg | MEDICATED_PATCH | Freq: Every day | TRANSDERMAL | Status: DC
  Filled 2024-03-26: qty 1

## 2024-03-26 MED ORDER — ACETAMINOPHEN 325 MG PO TABS
650.0000 mg | ORAL_TABLET | Freq: Four times a day (QID) | ORAL | Status: DC | PRN
  Administered 2024-03-28 – 2024-03-29 (×2): 650 mg via ORAL
  Filled 2024-03-26 (×2): qty 2

## 2024-03-26 MED ORDER — PRAZOSIN HCL 1 MG PO CAPS
2.0000 mg | ORAL_CAPSULE | Freq: Every day | ORAL | Status: DC
  Administered 2024-03-27 – 2024-03-28 (×2): 2 mg via ORAL
  Filled 2024-03-26 (×3): qty 2

## 2024-03-26 NOTE — H&P (Signed)
 History and Physical    Joe Lawrence ZOX:[id number] DOB: 10-Apr-1971 DOA: 03/26/2024  PCP: Ronnald Nian, MD  Patient coming from: Home  I have personally briefly reviewed patient's old medical records in [facility]  Chief Complaint: Hypoxia, dyspnea, cough  HPI: Joe Lawrence is a 53 y.o. male with medical history significant for asthma, opioid use disorder on Suboxone, history of alcohol use disorder in remission with prior alcoholic pancreatitis, depression, anxiety, bipolar disorder who presented to the ED for evaluation of shortness of breath associated with cough and hypoxia.  Patient states over the last 2 days he has noted dyspnea with exertion.  He has felt very short of breath with activity and had difficulty taking deep inspiration.  He has had a nonproductive cough associated with this, worse when taking deep breaths.  He noticed that his home pulse ox showed that his SpO2 was dropping to 85% at home.  He tried his levalbuterol inhaler with some transient improvement in breathing and SpO2 however dyspnea and hypoxia quickly returned.  He does note that his insurance sent him a letter today that his levalbuterol with no longer be covered.  Patient denies fevers, chills, diaphoresis.  He reports some loose stools but no frank diarrhea.  He denies abdominal pain.  He denies any recent aspiration events.  He reports ongoing tobacco use, smokes about 8 cigarettes daily, and has been working on cutting back.  He has been using nicotine lozenges at home.  He reports prior heavy alcohol use but has completely quit due to issues with pancreatitis in the past.  ED Course  Labs/Imaging on admission: I have personally reviewed following labs and imaging studies.  Initial vitals showed BP 138/77, pulse 84, RR 13, temp 97.8 F, SpO2 94% on room air.  Per EDP SpO2 dropped to 85% while on room air, he was placed on 2 L supplemental O2 via [place] with improvement to  >94%.  Labs showed WBC 6.2, hemoglobin 12.2, platelets 227,000, sodium 131, potassium 3.8, bicarb 26, BUN 20, creatinine 0.95, serum glucose 110.  2 view chest x-ray showed faint opacities in the right upper and lower lobes concerning for pneumonia.  Left lung is clear.  Findings are new when compared to CXR on 03/04/2024.  Patient was given IV ceftriaxone and azithromycin.  The hospitalist service was consulted to admit.  Review of Systems: All systems reviewed and are negative except as documented in history of present illness above.   Past Medical History:  Diagnosis Date   Anxiety    Asthma    Bipolar disorder (HCC)    Bronchitis    Chronic back pain    Chronic pancreatitis (HCC)    Depression    GERD (gastroesophageal reflux disease)    Hx of suicide attempt    x 3   Pancreatitis    PPD positive    Tobacco abuse     Past Surgical History:  Procedure Laterality Date   BACK SURGERY     internal nerve stimulator placement and removal   Epidural Steroid Injection  02/23/2011   Right Arm Surgery     Shave Right 01/08/2021   seborrheic keratosis, inflamed    Social History: Reports smoking about 8 cigarettes daily.  Prior alcohol use, denies any alcohol use within the last year.  Allergies  Allergen Reactions   Celexa [Citalopram] Other (See Comments)    Muscle tighness   Chlorpromazine Hcl Other (See Comments)    Pt does not remember  this allergy.  May be 20 years ago.   Iodine Other (See Comments)    Skin has burning sensation.   Sulfonamide Derivatives Other (See Comments)    Patient does not know of this allergy or the reaction.   Voltaren [Diclofenac Sodium] Other (See Comments)    voltaren has caused his arm to swell in the past, does not remember when this happened.    Family History  Problem Relation Age of Onset   Alcohol abuse Brother      Prior to Admission medications   Medication Sig Start Date End Date Taking? Authorizing Provider   Buprenorphine HCl-Naloxone HCl (SUBOXONE) 8-2 MG FILM Place 0.5 each under the tongue 5 (five) times daily.    [provider]  cloNIDine (CATAPRES) 0.1 MG tablet Take 0.5-1 tablets by mouth 4 (four) times daily as needed (anxiety). 08/19/23   [provider]  escitalopram (LEXAPRO) 20 MG tablet Take 1 tablet (20 mg total) by mouth daily. 02/25/23   Starleen Blue, NP  ferrous sulfate 325 (65 FE) MG EC tablet Take 325 mg by mouth in the morning and at bedtime.    [provider]  furosemide (LASIX) 20 MG tablet Take 0.5-1 tablets (10-20 mg total) by mouth daily as needed for fluid or edema. 03/03/24   Joselyn Arrow, MD  gabapentin (NEURONTIN) 600 MG tablet Take 1 tablet (600 mg total) by mouth in the morning, at noon, in the evening, and at bedtime. 02/19/24   Ronnald Nian, MD  hydrOXYzine (VISTARIL) 50 MG capsule Take 50 mg by mouth as needed for anxiety. 11/28/23   [provider]  levalbuterol (XOPENEX HFA) 45 MCG/ACT inhaler Inhale 1 puff into the lungs as needed for wheezing or shortness of breath. 12/14/23   Marguerita Merles Latif, DO  loratadine (CLARITIN) 10 MG tablet Take 10 mg by mouth daily. Patient not taking: Reported on 03/03/2024    [provider]  olopatadine (PATADAY) 0.1 % ophthalmic solution Place 1 drop into both eyes daily as needed (itching). 02/25/23   Starleen Blue, NP  ondansetron (ZOFRAN) 4 MG tablet Take 4 mg by mouth as needed for nausea or vomiting. Patient not taking: Reported on 03/02/2024    [provider]  OVER THE COUNTER MEDICATION Take 1-2 capsules by mouth with breakfast, with lunch, and with evening meal. Enzymatica digestive enzymes    [provider]  pantoprazole (PROTONIX) 40 MG tablet Take 1 tablet (40 mg total) by mouth 2 (two) times daily. 11/18/23   Ronnald Nian, MD  prazosin (MINIPRESS) 2 MG capsule Take 2 mg by mouth daily. 02/26/24   [provider]  QUEtiapine (SEROQUEL) 50 MG tablet  Take 1 tablet (50 mg total) by mouth 2 (two) times daily. Patient taking differently: Take 50-150 mg by mouth See admin instructions. Take 50 mg by mouth three times daily. Take 150 mg by mouth at bedtime. 02/25/23   Starleen Blue, NP  XIIDRA 5 % SOLN Place 1 drop into both eyes 2 (two) times daily. 11/18/23   [provider]    Physical Exam: Vitals:   03/26/24 1520 03/26/24 1800 03/26/24 1900 03/26/24 1937  BP: 138/77 127/86 125/84 (!) 141/95  Pulse: 83 73 77 79  Resp: 13 17 17 20   Temp: 97.8 F (36.6 C)  98 F (36.7 C) 97.7 F (36.5 C)  TempSrc: Oral   Oral  SpO2: 94% 97% 97% 93%   Constitutional: Resting in bed, NAD, calm, comfortable Eyes: EOMI, lids  and conjunctivae normal ENMT: Mucous membranes are moist. Posterior pharynx clear of any exudate or lesions.Normal dentition.  Neck: normal, supple, no masses. Respiratory: Some wheezing noted. Normal respiratory effort while on 2 L O2 via [place]. No accessory muscle use.  Cardiovascular: Regular rate and rhythm, no murmurs / rubs / gallops.  Trace bilateral lower extremity edema. 2+ pedal pulses. Abdomen: no tenderness, no masses palpated. Musculoskeletal: no clubbing / cyanosis. No joint deformity upper and lower extremities. Good ROM, no contractures. Normal muscle tone.  Skin: no rashes, lesions, ulcers. No induration Neurologic: Sensation intact. Strength 5/5 in all 4.  Psychiatric: Normal judgment and insight. Alert and oriented x 3. Normal mood.   EKG: Personally reviewed. Sinus rhythm, rate 84, IVCD, no acute ischemic changes.  Similar to previous.  Assessment/Plan Principal Problem:   Community acquired pneumonia of right upper and lower lobes of lung Active Problems:   Acute respiratory failure with hypoxia (HCC)   Mild persistent asthma with acute exacerbation   History of alcohol abuse   Opioid use disorder, severe, dependence (HCC)   Depression with anxiety   Joe Lawrence is a 53 y.o. male with  medical history significant for asthma, opioid use disorder on Suboxone, history of alcohol use disorder in remission with prior alcoholic pancreatitis, depression, anxiety, bipolar disorder who is admitted with pneumonia of the right lung with associated hypoxia.  Assessment and Plan: Community-acquired pneumonia involving the right upper and lower lobes of the lung: Likely etiology of presentation with mild exacerbation of his asthma as well.  CXR findings are new when compared to previous chest x-ray on 3/6.  Patient denies any recent aspiration events. -Continue IV ceftriaxone and azithromycin -Check strep pneumonia and Legionella urinary antigens -Incentive spirometer, flutter valve, Mucinex -Continue supplemental oxygen as needed -Follow COVID, influenza, RSV, and RVP  Acute respiratory failure with hypoxia: SpO2 down to 85% on room air, on 2 L via [place] on admission.  Likely secondary to combination of pneumonia and mild asthma exacerbation.  He does not require supplemental oxygen at his baseline. -Continue antibiotics and supplemental oxygen as above -Give DuoNeb treatment now  Asthma: Likely mild exacerbation triggered by pneumonia.  Given DuoNeb treatment now.  Continue albuterol nebulizers as needed afterwards.  Opioid use disorder: Continue equivalent of home Suboxone 4 times daily.  History of alcohol use disorder: Patient reports continued abstinence from alcohol use.  Depression/anxiety/bipolar disorder/nightmares: -Continue home Lexapro 20 mg daily -Continue Seroquel 50 mg TID and 150 mg at bedtime -Continue hydroxyzine 50 mg 3 times daily as needed -Continue prazosin 2 mg daily -Continue clonidine 0.05-0.1 4 times daily as needed  Tobacco use: Patient reports that he is working on quitting smoking, he is smoking about 8 cigarettes daily now.  Nicotine patch provided.   DVT prophylaxis: enoxaparin (LOVENOX) injection 40 mg Start: 03/26/24 2200 Code Status: Full code,  confirmed with patient Family Communication: Discussed with patient, he has discussed with family Disposition Plan: From home and likely discharge to home pending clinical progress Consults called: None Severity of Illness: The appropriate patient status for this patient is OBSERVATION. Observation status is judged to be reasonable and necessary in order to provide the required intensity of service to ensure the patient's safety. The patient's presenting symptoms, physical exam findings, and initial radiographic and laboratory data in the context of their medical condition is felt to place them at decreased risk for further clinical deterioration. Furthermore, it is anticipated that the patient will be medically stable for discharge  from the hospital within 2 midnights of admission.   Joe Mclean MD [facility]  If 7PM-7AM, please contact night-coverage [email or web address]  03/26/2024, 8:05 PM

## 2024-03-26 NOTE — ED Triage Notes (Signed)
 Patient presents due to low O2 saturation at home Before patient used his inhaler today, it was 85% and 82% earlier than that. He reports sats can drop into the 70's and drop quickly with activity. Other symptoms include chest tightness and difficulty taking a deep breath with nausea.

## 2024-03-26 NOTE — ED Provider Notes (Signed)
 [place] EMERGENCY DEPARTMENT AT [facility] Provider Note   CSN: [id number] Arrival date & time: 03/26/24  1502     History  Chief Complaint  Patient presents with   Shortness of Breath   Chest Pain    Joe Lawrence is a 53 y.o. male.  53 year old male who is here today for cough, low O2 saturations.  Patient reports he was hospitalized for pneumonia about a month ago.  Had been feeling well until last couple of days.  Patient has a pulse ox at home, has been checking it, noticed that it was low.  Came to the emergency room for evaluation.  Patient also has endorsed having some lower extremity swelling over the last 1 week.   Shortness of Breath Associated symptoms: chest pain   Chest Pain Associated symptoms: shortness of breath        Home Medications Prior to Admission medications   Medication Sig Start Date End Date Taking? Authorizing Provider  Buprenorphine HCl-Naloxone HCl (SUBOXONE) 8-2 MG FILM Place 0.5 each under the tongue 5 (five) times daily.    [provider]  cloNIDine (CATAPRES) 0.1 MG tablet Take 0.5-1 tablets by mouth 4 (four) times daily as needed (anxiety). 08/19/23   [provider]  escitalopram (LEXAPRO) 20 MG tablet Take 1 tablet (20 mg total) by mouth daily. 02/25/23   Starleen Blue, NP  ferrous sulfate 325 (65 FE) MG EC tablet Take 325 mg by mouth in the morning and at bedtime.    [provider]  furosemide (LASIX) 20 MG tablet Take 0.5-1 tablets (10-20 mg total) by mouth daily as needed for fluid or edema. 03/03/24   Joselyn Arrow, MD  gabapentin (NEURONTIN) 600 MG tablet Take 1 tablet (600 mg total) by mouth in the morning, at noon, in the evening, and at bedtime. 02/19/24   Ronnald Nian, MD  hydrOXYzine (VISTARIL) 50 MG capsule Take 50 mg by mouth as needed for anxiety. 11/28/23   [provider]  levalbuterol (XOPENEX HFA) 45 MCG/ACT inhaler Inhale 1 puff into the lungs as needed for  wheezing or shortness of breath. 12/14/23   Marguerita Merles Latif, DO  loratadine (CLARITIN) 10 MG tablet Take 10 mg by mouth daily. Patient not taking: Reported on 03/03/2024    [provider]  olopatadine (PATADAY) 0.1 % ophthalmic solution Place 1 drop into both eyes daily as needed (itching). 02/25/23   Starleen Blue, NP  ondansetron (ZOFRAN) 4 MG tablet Take 4 mg by mouth as needed for nausea or vomiting. Patient not taking: Reported on 03/02/2024    [provider]  OVER THE COUNTER MEDICATION Take 1-2 capsules by mouth with breakfast, with lunch, and with evening meal. Enzymatica digestive enzymes    [provider]  pantoprazole (PROTONIX) 40 MG tablet Take 1 tablet (40 mg total) by mouth 2 (two) times daily. 11/18/23   Ronnald Nian, MD  prazosin (MINIPRESS) 2 MG capsule Take 2 mg by mouth daily. 02/26/24   [provider]  QUEtiapine (SEROQUEL) 50 MG tablet Take 1 tablet (50 mg total) by mouth 2 (two) times daily. Patient taking differently: Take 50-150 mg by mouth See admin instructions. Take 50 mg by mouth three times daily. Take 150 mg by mouth at bedtime. 02/25/23   Starleen Blue, NP  XIIDRA 5 % SOLN Place 1 drop into both eyes 2 (two) times daily. 11/18/23   [provider]      Allergies    Celexa [  citalopram], Chlorpromazine hcl, Iodine, Sulfonamide derivatives, and Voltaren [diclofenac sodium]    Review of Systems   Review of Systems  Respiratory:  Positive for shortness of breath.   Cardiovascular:  Positive for chest pain.    Physical Exam Updated Vital Signs BP 138/77 (BP Location: Right Arm)   Pulse 83   Temp 97.8 F (36.6 C) (Oral)   Resp 13   SpO2 94%  Physical Exam Vitals reviewed.  Constitutional:      Appearance: He is not toxic-appearing.  Pulmonary:     Effort: Pulmonary effort is normal.     Breath sounds: Examination of the right-middle field reveals wheezing. Examination of the right-lower field reveals  wheezing. Wheezing present. No decreased breath sounds or rhonchi.  Chest:     Chest wall: No mass, tenderness or edema.  Musculoskeletal:     Cervical back: Normal range of motion.  Neurological:     Mental Status: He is alert.     ED Results / Procedures / Treatments   Labs (all labs ordered are listed, but only abnormal results are displayed) Labs Reviewed  BASIC METABOLIC PANEL WITH GFR - Abnormal; Notable for the following components:      Result Value   Sodium 131 (*)    Chloride 96 (*)    Glucose, Bld 110 (*)    All other components within normal limits  CBC - Abnormal; Notable for the following components:   Hemoglobin 12.2 (*)    All other components within normal limits    EKG None  Radiology DG Chest 2 View Result Date: 03/26/2024 CLINICAL DATA:  Shortness of breath. EXAM: CHEST - 2 VIEW COMPARISON:  March 04, 2024. FINDINGS: The heart size and mediastinal contours are within normal limits. Faint opacities are noted in right upper and lower lobes concerning for pneumonia. Left lung is clear. The visualized skeletal structures are unremarkable. IMPRESSION: Mild right lung opacities are noted concerning for pneumonia. Followup PA and lateral chest X-ray is recommended in 3-4 weeks following trial of antibiotic therapy to ensure resolution and exclude underlying malignancy. Electronically Signed   By: Lupita Raider M.D.   On: 03/26/2024 16:22    Procedures .Critical Care  Performed by: Arletha Pili, DO Authorized by: Arletha Pili, DO   Critical care provider statement:    Critical care time (minutes):  33   Critical care was necessary to treat or prevent imminent or life-threatening deterioration of the following conditions:  Respiratory failure   Critical care was time spent personally by me on the following activities:  Development of treatment plan with patient or surrogate, discussions with consultants, evaluation of patient's response to treatment,  examination of patient, ordering and review of laboratory studies, ordering and review of radiographic studies, ordering and performing treatments and interventions, pulse oximetry, re-evaluation of patient's condition and review of old charts     Medications Ordered in ED Medications  albuterol (VENTOLIN HFA) 108 (90 Base) MCG/ACT inhaler 2 puff (has no administration in time range)  azithromycin (ZITHROMAX) 500 mg in sodium chloride 0.9 % 250 mL IVPB (has no administration in time range)  cefTRIAXone (ROCEPHIN) 1 g in sodium chloride 0.9 % 100 mL IVPB (1 g Intravenous New Bag/Given 03/26/24 1711)    ED Course/ Medical Decision Making/ A&P                                 Medical Decision  Making 53 year old male here today for shortness of breath, hypoxia.  Plan-on exam, patient overall looks well.  I was apprised when I put a pulse ox on the patient and found his O2 sat to 85% on room air.  Put him on 2 L of oxygen.  Patient does have some wheezing on the right side.  My independent review the patient's chest x-ray does show opacity in the right.   This patient does not have sepsis.  Considered hospital-acquired pneumonia antibiotics in this patient, however despite more than 3 months since his hospitalization.  Considered PE in this patient, however he is not tachycardic, has a consolidation on chest x-ray and has a productive cough.  Will admit patient to hospitalist for pneumonia, hypoxia.  Amount and/or Complexity of Data Reviewed Labs: ordered. Radiology: ordered.  Risk Prescription drug management.           Final Clinical Impression(s) / ED Diagnoses Final diagnoses:  Hypoxia  Pneumonia of right lower lobe due to infectious organism    Rx / DC Orders ED Discharge Orders     None         Anders Simmonds T, DO 03/26/24 1800

## 2024-03-26 NOTE — Hospital Course (Addendum)
 HPI: Joe Lawrence is a 53 y.o. male with medical history significant for asthma, opioid use disorder on Suboxone, history of alcohol use disorder in remission with prior alcoholic pancreatitis, depression, anxiety, bipolar disorder who presented to the ED for evaluation of shortness of breath associated with cough and hypoxia.   Patient states over the last 2 days he has noted dyspnea with exertion.  He has felt very short of breath with activity and had difficulty taking deep inspiration.  He has had a nonproductive cough associated with this, worse when taking deep breaths.  He noticed that his home pulse ox showed that his SpO2 was dropping to 85% at home.  He tried his levalbuterol inhaler with some transient improvement in breathing and SpO2 however dyspnea and hypoxia quickly returned.  He does note that his insurance sent him a letter today that his levalbuterol with no longer be covered.   Patient denies fevers, chills, diaphoresis.  He reports some loose stools but no frank diarrhea.  He denies abdominal pain.  He denies any recent aspiration events.   He reports ongoing tobacco use, smokes about 8 cigarettes daily, and has been working on cutting back.  He has been using nicotine lozenges at home.  He reports prior heavy alcohol use but has completely quit due to issues with pancreatitis in the past.  Initial vitals showed BP 138/77, pulse 84, RR 13, temp 97.8 F, SpO2 94% on room air. Per EDP SpO2 dropped to 85% while on room air, he was placed on 2 L supplemental O2 via [place] with improvement to >94%.   Patient was given IV ceftriaxone and azithromycin. The hospitalist service was consulted to admit.   Significant Events: Admitted 03/26/2024 right sided CAP, acute respiratory failure with hypoxia   Significant Labs: WBC 6.2, Hg 12.2, plt 227 Na 131, K 3.8, CO2 of 26, BUN 20, Scr 0.95, glu 110 Covid/flu/rsv negative RVP negative  Significant Imaging Studies:   Antibiotic  Therapy: Anti-infectives (From admission, onward)    Start     Dose/Rate Route Frequency Ordered Stop   03/27/24 1700  cefTRIAXone (ROCEPHIN) 2 g in sodium chloride 0.9 % 100 mL IVPB        2 g 200 mL/hr over 30 Minutes Intravenous Every 24 hours 03/26/24 1953 03/31/24 1659   03/27/24 1700  azithromycin (ZITHROMAX) 500 mg in sodium chloride 0.9 % 250 mL IVPB        500 mg 250 mL/hr over 60 Minutes Intravenous Every 24 hours 03/26/24 1953 03/31/24 1659   03/26/24 1615  cefTRIAXone (ROCEPHIN) 1 g in sodium chloride 0.9 % 100 mL IVPB        1 g 200 mL/hr over 30 Minutes Intravenous  Once 03/26/24 1600 03/26/24 1900   03/26/24 1615  azithromycin (ZITHROMAX) 500 mg in sodium chloride 0.9 % 250 mL IVPB        500 mg 250 mL/hr over 60 Minutes Intravenous  Once 03/26/24 1600 03/26/24 2018       Procedures:   Consultants:

## 2024-03-27 ENCOUNTER — Encounter (HOSPITAL_COMMUNITY): Payer: Self-pay | Admitting: Internal Medicine

## 2024-03-27 DIAGNOSIS — F17210 Nicotine dependence, cigarettes, uncomplicated: Secondary | ICD-10-CM | POA: Diagnosis present

## 2024-03-27 DIAGNOSIS — Z882 Allergy status to sulfonamides status: Secondary | ICD-10-CM | POA: Diagnosis not present

## 2024-03-27 DIAGNOSIS — R9431 Abnormal electrocardiogram [ECG] [EKG]: Secondary | ICD-10-CM | POA: Diagnosis not present

## 2024-03-27 DIAGNOSIS — J9601 Acute respiratory failure with hypoxia: Secondary | ICD-10-CM | POA: Diagnosis present

## 2024-03-27 DIAGNOSIS — Z79899 Other long term (current) drug therapy: Secondary | ICD-10-CM | POA: Diagnosis not present

## 2024-03-27 DIAGNOSIS — J189 Pneumonia, unspecified organism: Secondary | ICD-10-CM | POA: Diagnosis present

## 2024-03-27 DIAGNOSIS — Z888 Allergy status to other drugs, medicaments and biological substances status: Secondary | ICD-10-CM | POA: Diagnosis not present

## 2024-03-27 DIAGNOSIS — J4531 Mild persistent asthma with (acute) exacerbation: Secondary | ICD-10-CM | POA: Diagnosis present

## 2024-03-27 DIAGNOSIS — Z811 Family history of alcohol abuse and dependence: Secondary | ICD-10-CM | POA: Diagnosis not present

## 2024-03-27 DIAGNOSIS — R0902 Hypoxemia: Secondary | ICD-10-CM | POA: Diagnosis present

## 2024-03-27 DIAGNOSIS — F1011 Alcohol abuse, in remission: Secondary | ICD-10-CM

## 2024-03-27 DIAGNOSIS — Z716 Tobacco abuse counseling: Secondary | ICD-10-CM | POA: Diagnosis not present

## 2024-03-27 DIAGNOSIS — Z720 Tobacco use: Secondary | ICD-10-CM

## 2024-03-27 DIAGNOSIS — F418 Other specified anxiety disorders: Secondary | ICD-10-CM

## 2024-03-27 DIAGNOSIS — Z1152 Encounter for screening for COVID-19: Secondary | ICD-10-CM | POA: Diagnosis not present

## 2024-03-27 DIAGNOSIS — F32A Depression, unspecified: Secondary | ICD-10-CM | POA: Diagnosis present

## 2024-03-27 DIAGNOSIS — F1120 Opioid dependence, uncomplicated: Secondary | ICD-10-CM

## 2024-03-27 DIAGNOSIS — F419 Anxiety disorder, unspecified: Secondary | ICD-10-CM | POA: Diagnosis present

## 2024-03-27 DIAGNOSIS — Z9151 Personal history of suicidal behavior: Secondary | ICD-10-CM | POA: Diagnosis not present

## 2024-03-27 DIAGNOSIS — F1010 Alcohol abuse, uncomplicated: Secondary | ICD-10-CM | POA: Diagnosis present

## 2024-03-27 DIAGNOSIS — E871 Hypo-osmolality and hyponatremia: Secondary | ICD-10-CM | POA: Diagnosis present

## 2024-03-27 LAB — RESPIRATORY PANEL BY PCR

## 2024-03-27 LAB — CBC
HCT: 39.5 % (ref 39.0–52.0)
Hemoglobin: 12.3 g/dL — ABNORMAL LOW (ref 13.0–17.0)
MCH: 27.7 pg (ref 26.0–34.0)
MCHC: 31.1 g/dL (ref 30.0–36.0)
MCV: 89 fL (ref 80.0–100.0)
Platelets: 267 10*3/uL (ref 150–400)
RBC: 4.44 MIL/uL (ref 4.22–5.81)
RDW: 12.7 % (ref 11.5–15.5)
WBC: 6.5 10*3/uL (ref 4.0–10.5)
nRBC: 0 % (ref 0.0–0.2)

## 2024-03-27 LAB — BASIC METABOLIC PANEL WITH GFR
Anion gap: 10 (ref 5–15)
BUN: 16 mg/dL (ref 6–20)
CO2: 23 mmol/L (ref 22–32)
Calcium: 9 mg/dL (ref 8.9–10.3)
Chloride: 100 mmol/L (ref 98–111)
Creatinine, Ser: 0.76 mg/dL (ref 0.61–1.24)
GFR, Estimated: >60 mL/min (ref >60)
Glucose, Bld: 103 mg/dL — ABNORMAL HIGH (ref 70–99)
Potassium: 4.4 mmol/L (ref 3.5–5.1)
Sodium: 133 mmol/L — ABNORMAL LOW (ref 135–145)

## 2024-03-27 MED ORDER — AZITHROMYCIN 250 MG PO TABS: 500.0000 mg | ORAL_TABLET | Freq: Every day | ORAL | Status: DC

## 2024-03-27 MED ORDER — NICOTINE 7 MG/24HR TD PT24
7.0000 mg | MEDICATED_PATCH | Freq: Every day | TRANSDERMAL | Status: DC
  Administered 2024-03-27 – 2024-03-29 (×3): 7 mg via TRANSDERMAL
  Filled 2024-03-27 (×3): qty 1

## 2024-03-27 MED ORDER — SALINE SPRAY 0.65 % NA SOLN: 2.0000 | NASAL | Status: DC | PRN

## 2024-03-27 MED ORDER — DOXYCYCLINE HYCLATE 100 MG PO TABS
100.0000 mg | ORAL_TABLET | Freq: Two times a day (BID) | ORAL | Status: DC
  Administered 2024-03-27 – 2024-03-29 (×5): 100 mg via ORAL
  Filled 2024-03-27 (×5): qty 1

## 2024-03-27 NOTE — Assessment & Plan Note (Signed)
 03-27-2024 change to po doxycycline due to prolonged Qtc on EKG. Continue IV rocephin.

## 2024-03-27 NOTE — Assessment & Plan Note (Signed)
 03-27-2024 not on home O2. Continue with supplemental O2.  03-28-2024 wean to RA. RT or RN, please document why pt cannot be weaned to RA.  03-29-2024 resolved. Weaned to RA.

## 2024-03-27 NOTE — TOC Initial Note (Signed)
 Transition of Care [facility]) - Initial/Assessment Note    Patient Details  Name: Joe Lawrence MRN: [id number] Date of Birth: Jul 03, 1971  [facility] CM/SW Contact:    Adrian Prows, RN Phone Number: 03/27/2024, 3:15 PM  Clinical Narrative:                 Spoke w/ pt and mother Erby Pian [phone]) in room; pt lives at home w/ his mother; he plans to return at d/c; his mother will provide transportation; pt verified insurance/PCP; he denies SDOH risks; pt says he does not have DME, HH services, or home oxygen; TOC will follow.  Expected Discharge Plan: Home/Self Care Barriers to Discharge: Continued Medical Work up   Patient Goals and CMS Choice Patient states their goals for this hospitalization and ongoing recovery are:: home CMS Medicare.gov Compare Post Acute Care list provided to:: Patient   [place] ownership interest in [facility].provided to:: Patient    Expected Discharge Plan and Services   Discharge Planning Services: CM Consult   Living arrangements for the past 2 months: Single Family Home                                      Prior Living Arrangements/Services Living arrangements for the past 2 months: Single Family Home Lives with:: Parents Patient language and need for interpreter reviewed:: Yes Do you feel safe going back to the place where you live?: Yes      Need for Family Participation in Patient Care: Yes (Comment) Care giver support system in place?: Yes (comment) Current home services:  (n/a) Criminal Activity/Legal Involvement Pertinent to Current Situation/Hospitalization: No - Comment as needed  Activities of Daily Living   ADL Screening (condition at time of admission) Independently performs ADLs?: No Does the patient have a NEW difficulty with bathing/dressing/toileting/self-feeding that is expected to last >3 days?: No Does the patient have a NEW difficulty with getting in/out of bed,  walking, or climbing stairs that is expected to last >3 days?: No Does the patient have a NEW difficulty with communication that is expected to last >3 days?: No Is the patient deaf or have difficulty hearing?: No Does the patient have difficulty seeing, even when wearing glasses/contacts?: No Does the patient have difficulty concentrating, remembering, or making decisions?: No  Permission Sought/Granted Permission sought to share information with : Case Manager Permission granted to share information with : Yes, Verbal Permission Granted  Share Information with NAME: Case Manager     Permission granted to share info w Relationship: Erby Pian (mother) [phone]     Emotional Assessment Appearance:: Appears stated age Attitude/Demeanor/Rapport: Gracious Affect (typically observed): Accepting Orientation: : Oriented to Self, Oriented to Place, Oriented to  Time, Oriented to Situation Alcohol / Substance Use: Not Applicable Psych Involvement: No (comment)  Admission diagnosis:  Hypoxia [R09.02] Pneumonia of right lower lobe due to infectious organism [J18.9] Community acquired pneumonia of right lung [J18.9] Patient Active Problem List   Diagnosis Date Noted   Tobacco abuse 03/27/2024   Community acquired pneumonia of right upper and lower lobes of lung 03/26/2024   Mild persistent asthma with acute exacerbation 03/26/2024   Generalized anxiety disorder 02/14/2023   Severe major depression, single episode, without psychotic features (HCC) 02/13/2023   Depression with anxiety 10/18/2020   Mild persistent asthma without complication 01/05/2018   Gastroesophageal reflux disease without esophagitis 04/08/2017  Chronic seasonal allergic rhinitis due to pollen 04/08/2017   Pancreatic insufficiency 02/03/2017   Opioid use disorder, severe, dependence (HCC) 11/13/2012   Acute respiratory failure with hypoxia (HCC) 09/27/2012   History of pancreatitis 07/20/2012   Back pain  11/25/2011   Obsessive-compulsive disorder 09/07/2007   History of alcohol abuse 09/07/2007   PCP:  Ronnald Nian, MD Pharmacy:   [facility] [place], [place] - [address] [facility] [address] [facility] [address] [place] [address] Phone: [phone] Fax: [phone]  [id number] - [place], [place] - [address] [facility] [address] [place] [address] Phone: [phone] Fax: [phone]     Social Drivers of Health (SDOH) Social History: SDOH Screenings   Food Insecurity: No Food Insecurity (03/27/2024)  Housing: Low Risk  (03/27/2024)  Transportation Needs: No Transportation Needs (03/27/2024)  Utilities: Not At Risk (03/27/2024)  Alcohol Screen: Low Risk  (02/13/2023)  Depression (PHQ2-9): Low Risk  (03/02/2024)  Financial Resource Strain: Low Risk  (03/22/2022)  Physical Activity: Sufficiently Active (08/21/2023)  Social Connections: Socially Isolated (08/21/2023)  Stress: No Stress Concern Present (08/21/2023)  Tobacco Use: High Risk (03/26/2024)   SDOH Interventions: Food Insecurity Interventions: Intervention Not Indicated, Inpatient TOC Housing Interventions: Intervention Not Indicated, Inpatient TOC Transportation Interventions: Intervention Not Indicated, Inpatient TOC Utilities Interventions: Intervention Not Indicated, Inpatient TOC   Readmission Risk Interventions    12/12/2023   12:52 PM  Readmission Risk Prevention Plan  Transportation Screening Complete  Medication Review (RN Care Manager) Complete  PCP or Specialist appointment within 3-5 days of discharge Complete  HRI or Home Care Consult Complete  SW Recovery Care/Counseling Consult Complete  Palliative Care Screening Not Applicable  Skilled Nursing Facility Not Applicable

## 2024-03-27 NOTE — Assessment & Plan Note (Signed)
 03-27-2024 stable.  03-28-2024 stable.  03-29-2024 stable.

## 2024-03-27 NOTE — Assessment & Plan Note (Signed)
 03-27-2024 no wheezing. Continue with prn albuterol nebs. No indication for steroids at this time.  03-28-2024 no wheezing today. Better lower lobe BS today.  03-29-2024 resolved. No wheezing

## 2024-03-27 NOTE — Care Management Obs Status (Signed)
 MEDICARE OBSERVATION STATUS NOTIFICATION   Patient Details  Name: Joe Lawrence MRN: [id number] Date of Birth: May 12, 1971   Medicare Observation Status Notification Given:  Yes    Adrian Prows, RN 03/27/2024, 3:13 PM

## 2024-03-27 NOTE — Assessment & Plan Note (Signed)
 03-27-2024 continue with lexapro, prn atarax.  03-28-2024 stable on lexapro, prn atarax.  03-29-2024 stable.

## 2024-03-27 NOTE — Assessment & Plan Note (Signed)
 03-27-2024 continue with QID suboxone. Pt has valid prescriber and valid Rx in PDMP.  03-28-2024 continue with QID suboxone.  03-29-2024 stable.

## 2024-03-27 NOTE — Subjective & Objective (Signed)
 Pt seen and examined. Still on O2. Had pneumococcal vaccination in 2024. Still SOB. No fevers.

## 2024-03-27 NOTE — Assessment & Plan Note (Signed)
 03-27-2024 nicotine patch 7 mg/day.  03-28-2024 continue with nicotine patch.  03-29-2024 advised him to stop smoking completely.

## 2024-03-27 NOTE — Plan of Care (Signed)

## 2024-03-27 NOTE — Progress Notes (Signed)
 PROGRESS NOTE    Joe Lawrence  WUJ:[id number] DOB: Jan 22, 1971 DOA: 03/26/2024 PCP: Ronnald Nian, MD  Subjective: Pt seen and examined. Still on O2. Had pneumococcal vaccination in 2024. Still SOB. No fevers.    Hospital Course: HPI: Joe Lawrence is a 53 y.o. male with medical history significant for asthma, opioid use disorder on Suboxone, history of alcohol use disorder in remission with prior alcoholic pancreatitis, depression, anxiety, bipolar disorder who presented to the ED for evaluation of shortness of breath associated with cough and hypoxia.   Patient states over the last 2 days he has noted dyspnea with exertion.  He has felt very short of breath with activity and had difficulty taking deep inspiration.  He has had a nonproductive cough associated with this, worse when taking deep breaths.  He noticed that his home pulse ox showed that his SpO2 was dropping to 85% at home.  He tried his levalbuterol inhaler with some transient improvement in breathing and SpO2 however dyspnea and hypoxia quickly returned.  He does note that his insurance sent him a letter today that his levalbuterol with no longer be covered.   Patient denies fevers, chills, diaphoresis.  He reports some loose stools but no frank diarrhea.  He denies abdominal pain.  He denies any recent aspiration events.   He reports ongoing tobacco use, smokes about 8 cigarettes daily, and has been working on cutting back.  He has been using nicotine lozenges at home.  He reports prior heavy alcohol use but has completely quit due to issues with pancreatitis in the past.  Initial vitals showed BP 138/77, pulse 84, RR 13, temp 97.8 F, SpO2 94% on room air. Per EDP SpO2 dropped to 85% while on room air, he was placed on 2 L supplemental O2 via [facility] with improvement to >94%.   Patient was given IV ceftriaxone and azithromycin. The hospitalist service was consulted to admit.   Significant Events: Admitted  03/26/2024 right sided CAP, acute respiratory failure with hypoxia   Significant Labs: WBC 6.2, Hg 12.2, plt 227 Na 131, K 3.8, CO2 of 26, BUN 20, Scr 0.95, glu 110 Covid/flu/rsv negative RVP negative  Significant Imaging Studies:   Antibiotic Therapy: Anti-infectives (From admission, onward)    Start     Dose/Rate Route Frequency Ordered Stop   03/27/24 1700  cefTRIAXone (ROCEPHIN) 2 g in sodium chloride 0.9 % 100 mL IVPB        2 g 200 mL/hr over 30 Minutes Intravenous Every 24 hours 03/26/24 1953 03/31/24 1659   03/27/24 1700  azithromycin (ZITHROMAX) 500 mg in sodium chloride 0.9 % 250 mL IVPB        500 mg 250 mL/hr over 60 Minutes Intravenous Every 24 hours 03/26/24 1953 03/31/24 1659   03/26/24 1615  cefTRIAXone (ROCEPHIN) 1 g in sodium chloride 0.9 % 100 mL IVPB        1 g 200 mL/hr over 30 Minutes Intravenous  Once 03/26/24 1600 03/26/24 1900   03/26/24 1615  azithromycin (ZITHROMAX) 500 mg in sodium chloride 0.9 % 250 mL IVPB        500 mg 250 mL/hr over 60 Minutes Intravenous  Once 03/26/24 1600 03/26/24 2018       Procedures:   Consultants:     Assessment and Plan: * Community acquired pneumonia of right upper and lower lobes of lung 03-27-2024 change to po doxycycline due to prolonged Qtc on EKG. Continue IV rocephin.  Mild persistent asthma with  acute exacerbation 03-27-2024 no wheezing. Continue with prn albuterol nebs. No indication for steroids at this time.  Acute respiratory failure with hypoxia (HCC) 03-27-2024 not on home O2. Continue with supplemental O2.  Tobacco abuse 03-27-2024 nicotine patch 7 mg/day.  Depression with anxiety 03-27-2024 continue with lexapro, prn atarax.  Opioid use disorder, severe, dependence (HCC) 03-27-2024 continue with QID suboxone. Pt has valid prescriber and valid Rx in PDMP.  History of alcohol abuse 03-27-2024 stable.   DVT prophylaxis: enoxaparin (LOVENOX) injection 40 mg Start: 03/26/24 2200    Code  Status: Full Code Family Communication: no family at bedside Disposition Plan: return home Reason for continuing need for hospitalization: remains on IV ABX and supplemental O2.  Objective: Vitals:   03/26/24 2139 03/26/24 2327 03/27/24 0431 03/27/24 0745  BP: 121/80 109/73 120/75 106/72  Pulse:  81 78 71  Resp:  20 20 16   Temp:  98.1 F (36.7 C) 98 F (36.7 C) 97.9 F (36.6 C)  TempSrc:  Oral Oral Oral  SpO2:  91% 92% (!) 89%    Intake/Output Summary (Last 24 hours) at 03/27/2024 3235 Last data filed at 03/27/2024 0429 Gross per 24 hour  Intake --  Output 1500 ml  Net -1500 ml   There were no vitals filed for this visit.  Examination:  Physical Exam Vitals and nursing note reviewed.  Constitutional:      General: He is not in acute distress.    Appearance: He is normal weight. He is not toxic-appearing or diaphoretic.  HENT:     Head: Normocephalic and atraumatic.  Cardiovascular:     Rate and Rhythm: Normal rate and regular rhythm.     Pulses: Normal pulses.  Pulmonary:     Effort: Pulmonary effort is normal.     Breath sounds: Rales present. No rhonchi.     Comments: Right base Abdominal:     General: Bowel sounds are normal. There is no distension.     Palpations: Abdomen is soft.  Musculoskeletal:     Right lower leg: No edema.     Left lower leg: No edema.  Skin:    General: Skin is warm and dry.     Capillary Refill: Capillary refill takes less than 2 seconds.  Neurological:     General: No focal deficit present.     Mental Status: He is alert and oriented to person, place, and time.    Data Reviewed: I have personally reviewed following labs and imaging studies  CBC: Recent Labs  Lab 03/26/24 1652 03/27/24 0526  WBC 6.2 6.5  HGB 12.2* 12.3*  HCT 39.3 39.5  MCV 88.5 89.0  PLT 227 267   Basic Metabolic Panel: Recent Labs  Lab 03/26/24 1652 03/27/24 0526  NA 131* 133*  K 3.8 4.4  CL 96* 100  CO2 26 23  GLUCOSE 110* 103*  BUN 20 16   CREATININE 0.95 0.76  CALCIUM 8.9 9.0   GFR: CrCl cannot be calculated (Unknown ideal weight.). BNP (last 3 results) Recent Labs    12/11/23 0547 12/14/23 0711  BNP 312.8* 17.9    Recent Results (from the past 240 hours)  Resp panel by RT-PCR (RSV, Flu A&B, Covid) Anterior Nasal Swab     Status: None   Collection Time: 03/26/24  7:22 PM   Specimen: Anterior Nasal Swab  Result Value Ref Range Status   SARS Coronavirus 2 by RT PCR NEGATIVE NEGATIVE Final    Comment: (NOTE) SARS-CoV-2 target nucleic acids are NOT  DETECTED.  The SARS-CoV-2 RNA is generally detectable in upper respiratory specimens during the acute phase of infection. The lowest concentration of SARS-CoV-2 viral copies this assay can detect is 138 copies/mL. A negative result does not preclude SARS-Cov-2 infection and should not be used as the sole basis for treatment or other patient management decisions. A negative result may occur with  improper specimen collection/handling, submission of specimen other than nasopharyngeal swab, presence of viral mutation(s) within the areas targeted by this assay, and inadequate number of viral copies(<138 copies/mL). A negative result must be combined with clinical observations, patient history, and epidemiological information. The expected result is Negative.  Fact Sheet for Patients:  BloggerCourse.com  Fact Sheet for Healthcare Providers:  SeriousBroker.it  This test is no t yet approved or cleared by the [place] FDA and  has been authorized for detection and/or diagnosis of SARS-CoV-2 by FDA under an Emergency Use Authorization (EUA). This EUA will remain  in effect (meaning this test can be used) for the duration of the COVID-19 declaration under Section 564(b)(1) of the Act, 21 U.S.C.section 360bbb-3(b)(1), unless the authorization is terminated  or revoked sooner.       Influenza A by PCR NEGATIVE  NEGATIVE Final   Influenza B by PCR NEGATIVE NEGATIVE Final    Comment: (NOTE) The Xpert Xpress SARS-CoV-2/FLU/RSV plus assay is intended as an aid in the diagnosis of influenza from Nasopharyngeal swab specimens and should not be used as a sole basis for treatment. Nasal washings and aspirates are unacceptable for Xpert Xpress SARS-CoV-2/FLU/RSV testing.  Fact Sheet for Patients: BloggerCourse.com  Fact Sheet for Healthcare Providers: SeriousBroker.it  This test is not yet approved or cleared by the [place] FDA and has been authorized for detection and/or diagnosis of SARS-CoV-2 by FDA under an Emergency Use Authorization (EUA). This EUA will remain in effect (meaning this test can be used) for the duration of the COVID-19 declaration under Section 564(b)(1) of the Act, 21 U.S.C. section 360bbb-3(b)(1), unless the authorization is terminated or revoked.     Resp Syncytial Virus by PCR NEGATIVE NEGATIVE Final    Comment: (NOTE) Fact Sheet for Patients: BloggerCourse.com  Fact Sheet for Healthcare Providers: SeriousBroker.it  This test is not yet approved or cleared by the [place] FDA and has been authorized for detection and/or diagnosis of SARS-CoV-2 by FDA under an Emergency Use Authorization (EUA). This EUA will remain in effect (meaning this test can be used) for the duration of the COVID-19 declaration under Section 564(b)(1) of the Act, 21 U.S.C. section 360bbb-3(b)(1), unless the authorization is terminated or revoked.  Performed at [facility], [address], [place], [place] [address]   Respiratory (~20 pathogens) panel by PCR     Status: None   Collection Time: 03/26/24  7:22 PM   Specimen: Anterior Nasal Swab; Respiratory  Result Value Ref Range Status   Adenovirus NOT DETECTED NOT DETECTED Final   Coronavirus 229E NOT DETECTED  NOT DETECTED Final    Comment: (NOTE) The Coronavirus on the Respiratory Panel, DOES NOT test for the novel  Coronavirus (2019 nCoV)    Coronavirus HKU1 NOT DETECTED NOT DETECTED Final   Coronavirus NL63 NOT DETECTED NOT DETECTED Final   Coronavirus OC43 NOT DETECTED NOT DETECTED Final   Metapneumovirus NOT DETECTED NOT DETECTED Final   Rhinovirus / Enterovirus NOT DETECTED NOT DETECTED Final   Influenza A NOT DETECTED NOT DETECTED Final   Influenza B NOT DETECTED NOT DETECTED Final   Parainfluenza  Virus 1 NOT DETECTED NOT DETECTED Final   Parainfluenza Virus 2 NOT DETECTED NOT DETECTED Final   Parainfluenza Virus 3 NOT DETECTED NOT DETECTED Final   Parainfluenza Virus 4 NOT DETECTED NOT DETECTED Final   Respiratory Syncytial Virus NOT DETECTED NOT DETECTED Final   Bordetella pertussis NOT DETECTED NOT DETECTED Final   Bordetella Parapertussis NOT DETECTED NOT DETECTED Final   Chlamydophila pneumoniae NOT DETECTED NOT DETECTED Final   Mycoplasma pneumoniae NOT DETECTED NOT DETECTED Final    Comment: Performed at [facility], [address], [place], [place] [address]   Radiology Studies: DG Chest 2 View Result Date: 03/26/2024 CLINICAL DATA:  Shortness of breath. EXAM: CHEST - 2 VIEW COMPARISON:  March 04, 2024. FINDINGS: The heart size and mediastinal contours are within normal limits. Faint opacities are noted in right upper and lower lobes concerning for pneumonia. Left lung is clear. The visualized skeletal structures are unremarkable. IMPRESSION: Mild right lung opacities are noted concerning for pneumonia. Followup PA and lateral chest X-ray is recommended in 3-4 weeks following trial of antibiotic therapy to ensure resolution and exclude underlying malignancy. Electronically Signed   By: Lupita Raider M.D.   On: 03/26/2024 16:22   Scheduled Meds:  buprenorphine-naloxone  2 tablet Sublingual QID   doxycycline  100 mg Oral Q12H   enoxaparin (LOVENOX) injection  40 mg  Subcutaneous Q24H   escitalopram  20 mg Oral Daily   gabapentin  600 mg Oral QID   guaiFENesin  600 mg Oral BID   nicotine  7 mg Transdermal Daily   pantoprazole  40 mg Oral BID   prazosin  2 mg Oral Daily   QUEtiapine  150 mg Oral QHS   QUEtiapine  50 mg Oral TID WC   Continuous Infusions:  cefTRIAXone (ROCEPHIN)  IV       LOS: 0 days   Time spent: 45 minutes  Carollee Herter, DO  [facility]  03/27/2024, 9:52 AM

## 2024-03-28 DIAGNOSIS — J189 Pneumonia, unspecified organism: Secondary | ICD-10-CM | POA: Diagnosis not present

## 2024-03-28 DIAGNOSIS — J4531 Mild persistent asthma with (acute) exacerbation: Secondary | ICD-10-CM | POA: Diagnosis not present

## 2024-03-28 DIAGNOSIS — J9601 Acute respiratory failure with hypoxia: Secondary | ICD-10-CM | POA: Diagnosis not present

## 2024-03-28 DIAGNOSIS — F418 Other specified anxiety disorders: Secondary | ICD-10-CM | POA: Diagnosis not present

## 2024-03-28 LAB — COMPREHENSIVE METABOLIC PANEL WITH GFR
ALT: 13 U/L (ref 0–44)
AST: 16 U/L (ref 15–41)
Albumin: 3.3 g/dL — ABNORMAL LOW (ref 3.5–5.0)
Alkaline Phosphatase: 65 U/L (ref 38–126)
Anion gap: 9 (ref 5–15)
BUN: 19 mg/dL (ref 6–20)
CO2: 24 mmol/L (ref 22–32)
Calcium: 8.9 mg/dL (ref 8.9–10.3)
Chloride: 100 mmol/L (ref 98–111)
Creatinine, Ser: 1.12 mg/dL (ref 0.61–1.24)
GFR, Estimated: >60 mL/min (ref >60)
Glucose, Bld: 103 mg/dL — ABNORMAL HIGH (ref 70–99)
Potassium: 4.1 mmol/L (ref 3.5–5.1)
Sodium: 133 mmol/L — ABNORMAL LOW (ref 135–145)
Total Bilirubin: 0.5 mg/dL (ref 0.0–1.2)
Total Protein: 6.7 g/dL (ref 6.5–8.1)

## 2024-03-28 LAB — CBC WITH DIFFERENTIAL/PLATELET
Abs Immature Granulocytes: 0.02 10*3/uL (ref 0.00–0.07)
Basophils Absolute: 0 10*3/uL (ref 0.0–0.1)
Basophils Relative: 1 %
Eosinophils Absolute: 0.2 10*3/uL (ref 0.0–0.5)
Eosinophils Relative: 3 %
HCT: 38.9 % — ABNORMAL LOW (ref 39.0–52.0)
Hemoglobin: 11.8 g/dL — ABNORMAL LOW (ref 13.0–17.0)
Immature Granulocytes: 0 %
Lymphocytes Relative: 19 %
Lymphs Abs: 1.1 10*3/uL (ref 0.7–4.0)
MCH: 27.4 pg (ref 26.0–34.0)
MCHC: 30.3 g/dL (ref 30.0–36.0)
MCV: 90.3 fL (ref 80.0–100.0)
Monocytes Absolute: 0.7 10*3/uL (ref 0.1–1.0)
Monocytes Relative: 13 %
Neutro Abs: 3.6 10*3/uL (ref 1.7–7.7)
Neutrophils Relative %: 64 %
Platelets: 251 10*3/uL (ref 150–400)
RBC: 4.31 MIL/uL (ref 4.22–5.81)
RDW: 12.7 % (ref 11.5–15.5)
WBC: 5.6 10*3/uL (ref 4.0–10.5)
nRBC: 0 % (ref 0.0–0.2)

## 2024-03-28 LAB — PROCALCITONIN: Procalcitonin: <0.1 ng/mL

## 2024-03-28 MED ORDER — CALCIUM CARBONATE ANTACID 500 MG PO CHEW
1.0000 | CHEWABLE_TABLET | Freq: Four times a day (QID) | ORAL | Status: DC | PRN
  Administered 2024-03-28: 200 mg via ORAL
  Filled 2024-03-28: qty 1

## 2024-03-28 MED ORDER — SIMETHICONE 80 MG PO CHEW
160.0000 mg | CHEWABLE_TABLET | Freq: Four times a day (QID) | ORAL | Status: DC | PRN
  Administered 2024-03-28: 160 mg via ORAL
  Filled 2024-03-28: qty 2

## 2024-03-28 NOTE — Progress Notes (Addendum)
 PROGRESS NOTE    Joe Lawrence  VHQ:[id number] DOB: 1971-11-06 DOA: 03/26/2024 PCP: Ronnald Nian, MD  Subjective: Pt seen and examined.  Remains on supplemental O2. Orders were written for pt to be weaned to RA. Still not done yet. Afebrile. Pt breathing better today.   Hospital Course: HPI: Joe Lawrence is a 53 y.o. male with medical history significant for asthma, opioid use disorder on Suboxone, history of alcohol use disorder in remission with prior alcoholic pancreatitis, depression, anxiety, bipolar disorder who presented to the ED for evaluation of shortness of breath associated with cough and hypoxia.   Patient states over the last 2 days he has noted dyspnea with exertion.  He has felt very short of breath with activity and had difficulty taking deep inspiration.  He has had a nonproductive cough associated with this, worse when taking deep breaths.  He noticed that his home pulse ox showed that his SpO2 was dropping to 85% at home.  He tried his levalbuterol inhaler with some transient improvement in breathing and SpO2 however dyspnea and hypoxia quickly returned.  He does note that his insurance sent him a letter today that his levalbuterol with no longer be covered.   Patient denies fevers, chills, diaphoresis.  He reports some loose stools but no frank diarrhea.  He denies abdominal pain.  He denies any recent aspiration events.   He reports ongoing tobacco use, smokes about 8 cigarettes daily, and has been working on cutting back.  He has been using nicotine lozenges at home.  He reports prior heavy alcohol use but has completely quit due to issues with pancreatitis in the past.  Initial vitals showed BP 138/77, pulse 84, RR 13, temp 97.8 F, SpO2 94% on room air. Per EDP SpO2 dropped to 85% while on room air, he was placed on 2 L supplemental O2 via [place] with improvement to >94%.   Patient was given IV ceftriaxone and azithromycin. The hospitalist service  was consulted to admit.   Significant Events: Admitted 03/26/2024 right sided CAP, acute respiratory failure with hypoxia   Significant Labs: WBC 6.2, Hg 12.2, plt 227 Na 131, K 3.8, CO2 of 26, BUN 20, Scr 0.95, glu 110 Covid/flu/rsv negative RVP negative  Significant Imaging Studies:   Antibiotic Therapy: Anti-infectives (From admission, onward)    Start     Dose/Rate Route Frequency Ordered Stop   03/27/24 1700  cefTRIAXone (ROCEPHIN) 2 g in sodium chloride 0.9 % 100 mL IVPB        2 g 200 mL/hr over 30 Minutes Intravenous Every 24 hours 03/26/24 1953 03/31/24 1659   03/27/24 1700  azithromycin (ZITHROMAX) 500 mg in sodium chloride 0.9 % 250 mL IVPB        500 mg 250 mL/hr over 60 Minutes Intravenous Every 24 hours 03/26/24 1953 03/31/24 1659   03/26/24 1615  cefTRIAXone (ROCEPHIN) 1 g in sodium chloride 0.9 % 100 mL IVPB        1 g 200 mL/hr over 30 Minutes Intravenous  Once 03/26/24 1600 03/26/24 1900   03/26/24 1615  azithromycin (ZITHROMAX) 500 mg in sodium chloride 0.9 % 250 mL IVPB        500 mg 250 mL/hr over 60 Minutes Intravenous  Once 03/26/24 1600 03/26/24 2018       Procedures:   Consultants:     Assessment and Plan: * Community acquired pneumonia of right upper and lower lobes of lung 03-27-2024 change to po doxycycline due to prolonged  Qtc on EKG. Continue IV rocephin. Had pneumococcal vaccination in 2024.  03-28-2024 stable on doxycycline and IV rocephin. Attempt to wean off O2 today. Pt to get out of bed and ambulate today. Plan for DC to home in AM. His hyponatremia is clinically insignificant.  Mild persistent asthma with acute exacerbation 03-27-2024 no wheezing. Continue with prn albuterol nebs. No indication for steroids at this time.  03-28-2024 no wheezing today. Better lower lobe BS today.  Acute respiratory failure with hypoxia (HCC) 03-27-2024 not on home O2. Continue with supplemental O2.  03-28-2024 wean to RA. RT or RN, please  document why pt cannot be weaned to RA.  Tobacco abuse 03-27-2024 nicotine patch 7 mg/day.  03-28-2024 continue with nicotine patch.  Depression with anxiety 03-27-2024 continue with lexapro, prn atarax.  03-28-2024 stable on lexapro, prn atarax.  Opioid use disorder, severe, dependence (HCC) 03-27-2024 continue with QID suboxone. Pt has valid prescriber and valid Rx in PDMP.  03-28-2024 continue with QID suboxone.  History of alcohol abuse 03-27-2024 stable.  03-28-2024 stable.  DVT prophylaxis: enoxaparin (LOVENOX) injection 40 mg Start: 03/26/24 2200    Code Status: Full Code Family Communication: no family at bedside. Pt is decisional. Disposition Plan: return home Reason for continuing need for hospitalization: remains on IV Abx, remains on supplemental O2. Attempting to wean off O2. Prep for DC tomorrow.  Objective: Vitals:   03/27/24 0431 03/27/24 0745 03/27/24 1945 03/28/24 0421  BP: 120/75 106/72 109/83 129/85  Pulse: 78 71 67 72  Resp: 20 16 18 18   Temp: 98 F (36.7 C) 97.9 F (36.6 C) 98.5 F (36.9 C) 98 F (36.7 C)  TempSrc: Oral Oral    SpO2: 92% (!) 89% 95% 96%   No intake or output data in the 24 hours ending 03/28/24 1023 There were no vitals filed for this visit.  Examination:  Physical Exam Vitals and nursing note reviewed.  Constitutional:      General: He is not in acute distress.    Appearance: He is normal weight. He is not toxic-appearing or diaphoretic.  HENT:     Head: Normocephalic and atraumatic.     Nose: Nose normal.  Cardiovascular:     Rate and Rhythm: Normal rate and regular rhythm.  Pulmonary:     Effort: Pulmonary effort is normal.     Breath sounds: Normal breath sounds.     Comments: Better breath sounds in right base today. No rales Abdominal:     General: Abdomen is flat. Bowel sounds are normal. There is no distension.     Palpations: Abdomen is soft.  Musculoskeletal:     Right lower leg: No edema.     Left  lower leg: No edema.  Skin:    General: Skin is warm and dry.     Capillary Refill: Capillary refill takes less than 2 seconds.  Neurological:     General: No focal deficit present.     Mental Status: He is alert and oriented to person, place, and time.     Data Reviewed: I have personally reviewed following labs and imaging studies  CBC: Recent Labs  Lab 03/26/24 1652 03/27/24 0526 03/28/24 0512  WBC 6.2 6.5 5.6  NEUTROABS  --   --  3.6  HGB 12.2* 12.3* 11.8*  HCT 39.3 39.5 38.9*  MCV 88.5 89.0 90.3  PLT 227 267 251   Basic Metabolic Panel: Recent Labs  Lab 03/26/24 1652 03/27/24 0526 03/28/24 0512  NA 131* 133* 133*  K 3.8 4.4 4.1  CL 96* 100 100  CO2 26 23 24   GLUCOSE 110* 103* 103*  BUN 20 16 19   CREATININE 0.95 0.76 1.12  CALCIUM 8.9 9.0 8.9   GFR: CrCl cannot be calculated (Unknown ideal weight.). Liver Function Tests: Recent Labs  Lab 03/28/24 0512  AST 16  ALT 13  ALKPHOS 65  BILITOT 0.5  PROT 6.7  ALBUMIN 3.3*   BNP (last 3 results) Recent Labs    12/11/23 0547 12/14/23 0711  BNP 312.8* 17.9   Sepsis Labs: Recent Labs  Lab 03/28/24 0512  PROCALCITON <0.10    Recent Results (from the past 240 hours)  Resp panel by RT-PCR (RSV, Flu A&B, Covid) Anterior Nasal Swab     Status: None   Collection Time: 03/26/24  7:22 PM   Specimen: Anterior Nasal Swab  Result Value Ref Range Status   SARS Coronavirus 2 by RT PCR NEGATIVE NEGATIVE Final    Comment: (NOTE) SARS-CoV-2 target nucleic acids are NOT DETECTED.  The SARS-CoV-2 RNA is generally detectable in upper respiratory specimens during the acute phase of infection. The lowest concentration of SARS-CoV-2 viral copies this assay can detect is 138 copies/mL. A negative result does not preclude SARS-Cov-2 infection and should not be used as the sole basis for treatment or other patient management decisions. A negative result may occur with  improper specimen collection/handling,  submission of specimen other than nasopharyngeal swab, presence of viral mutation(s) within the areas targeted by this assay, and inadequate number of viral copies(<138 copies/mL). A negative result must be combined with clinical observations, patient history, and epidemiological information. The expected result is Negative.  Fact Sheet for Patients:  BloggerCourse.com  Fact Sheet for Healthcare Providers:  SeriousBroker.it  This test is no t yet approved or cleared by the [place] FDA and  has been authorized for detection and/or diagnosis of SARS-CoV-2 by FDA under an Emergency Use Authorization (EUA). This EUA will remain  in effect (meaning this test can be used) for the duration of the COVID-19 declaration under Section 564(b)(1) of the Act, 21 U.S.C.section 360bbb-3(b)(1), unless the authorization is terminated  or revoked sooner.       Influenza A by PCR NEGATIVE NEGATIVE Final   Influenza B by PCR NEGATIVE NEGATIVE Final    Comment: (NOTE) The Xpert Xpress SARS-CoV-2/FLU/RSV plus assay is intended as an aid in the diagnosis of influenza from Nasopharyngeal swab specimens and should not be used as a sole basis for treatment. Nasal washings and aspirates are unacceptable for Xpert Xpress SARS-CoV-2/FLU/RSV testing.  Fact Sheet for Patients: BloggerCourse.com  Fact Sheet for Healthcare Providers: SeriousBroker.it  This test is not yet approved or cleared by the [place] FDA and has been authorized for detection and/or diagnosis of SARS-CoV-2 by FDA under an Emergency Use Authorization (EUA). This EUA will remain in effect (meaning this test can be used) for the duration of the COVID-19 declaration under Section 564(b)(1) of the Act, 21 U.S.C. section 360bbb-3(b)(1), unless the authorization is terminated or revoked.     Resp Syncytial Virus by PCR NEGATIVE  NEGATIVE Final    Comment: (NOTE) Fact Sheet for Patients: BloggerCourse.com  Fact Sheet for Healthcare Providers: SeriousBroker.it  This test is not yet approved or cleared by the [place] FDA and has been authorized for detection and/or diagnosis of SARS-CoV-2 by FDA under an Emergency Use Authorization (EUA). This EUA will remain in effect (meaning this test can be used) for the duration of the  COVID-19 declaration under Section 564(b)(1) of the Act, 21 U.S.C. section 360bbb-3(b)(1), unless the authorization is terminated or revoked.  Performed at [facility], [address] [place], [place] [address]   Respiratory (~20 pathogens) panel by PCR     Status: None   Collection Time: 03/26/24  7:22 PM   Specimen: Anterior Nasal Swab; Respiratory  Result Value Ref Range Status   Adenovirus NOT DETECTED NOT DETECTED Final   Coronavirus 229E NOT DETECTED NOT DETECTED Final    Comment: (NOTE) The Coronavirus on the Respiratory Panel, DOES NOT test for the novel  Coronavirus (2019 nCoV)    Coronavirus HKU1 NOT DETECTED NOT DETECTED Final   Coronavirus NL63 NOT DETECTED NOT DETECTED Final   Coronavirus OC43 NOT DETECTED NOT DETECTED Final   Metapneumovirus NOT DETECTED NOT DETECTED Final   Rhinovirus / Enterovirus NOT DETECTED NOT DETECTED Final   Influenza A NOT DETECTED NOT DETECTED Final   Influenza B NOT DETECTED NOT DETECTED Final   Parainfluenza Virus 1 NOT DETECTED NOT DETECTED Final   Parainfluenza Virus 2 NOT DETECTED NOT DETECTED Final   Parainfluenza Virus 3 NOT DETECTED NOT DETECTED Final   Parainfluenza Virus 4 NOT DETECTED NOT DETECTED Final   Respiratory Syncytial Virus NOT DETECTED NOT DETECTED Final   Bordetella pertussis NOT DETECTED NOT DETECTED Final   Bordetella Parapertussis NOT DETECTED NOT DETECTED Final   Chlamydophila pneumoniae NOT DETECTED NOT DETECTED Final   Mycoplasma  pneumoniae NOT DETECTED NOT DETECTED Final    Comment: Performed at [facility], [address] [place], [place] [address]     Radiology Studies: DG Chest 2 View Result Date: 03/26/2024 CLINICAL DATA:  Shortness of breath. EXAM: CHEST - 2 VIEW COMPARISON:  March 04, 2024. FINDINGS: The heart size and mediastinal contours are within normal limits. Faint opacities are noted in right upper and lower lobes concerning for pneumonia. Left lung is clear. The visualized skeletal structures are unremarkable. IMPRESSION: Mild right lung opacities are noted concerning for pneumonia. Followup PA and lateral chest X-ray is recommended in 3-4 weeks following trial of antibiotic therapy to ensure resolution and exclude underlying malignancy. Electronically Signed   By: Lupita Raider M.D.   On: 03/26/2024 16:22    Scheduled Meds:  buprenorphine-naloxone  2 tablet Sublingual QID   doxycycline  100 mg Oral Q12H   enoxaparin (LOVENOX) injection  40 mg Subcutaneous Q24H   escitalopram  20 mg Oral Daily   gabapentin  600 mg Oral QID   guaiFENesin  600 mg Oral BID   nicotine  7 mg Transdermal Daily   pantoprazole  40 mg Oral BID   prazosin  2 mg Oral Daily   QUEtiapine  150 mg Oral QHS   QUEtiapine  50 mg Oral TID WC   Continuous Infusions:  cefTRIAXone (ROCEPHIN)  IV 2 g (03/27/24 1659)     LOS: 1 day   Time spent: 40 minutes  Carollee Herter, DO  [facility]  03/28/2024, 10:23 AM

## 2024-03-28 NOTE — Plan of Care (Signed)

## 2024-03-28 NOTE — Progress Notes (Signed)
SATURATION QUALIFICATIONS: (This note is used to comply with regulatory documentation for home oxygen)  Patient Saturations on Room Air at Rest = 94%  Patient Saturations on Room Air while Ambulating = 91%  Patient Saturations on 2 Liters of oxygen while Ambulating = 97%  Please briefly explain why patient needs home oxygen:

## 2024-03-28 NOTE — Evaluation (Signed)
 Physical Therapy Evaluation Patient Details Name: Joe Lawrence MRN: [id number] DOB: [birth year] Today's Date: 03/28/2024  History of Present Illness  53 y.o. male who presented to the ED for evaluation of shortness of breath associated with cough and hypoxia. admitted with pna.  medical history significant for asthma, opioid use disorder on Suboxone, history of alcohol use disorder in remission with prior alcoholic pancreatitis, depression, anxiety, bipolar disorder  Clinical Impression  Patient evaluated by Physical Therapy with no further acute PT needs identified. All education has been completed and the patient has no further questions.  Pt amb 600' I'ly SpO2= 97% on RA at rest, decr to 86%-87% on RA with activity with pt able to recover sats >91% in ~ 30seconds with standing rest break and proper breathing techniques; reviewed use and importance of IS/flutter. Pt abel to return demo IS. RN aware, pt left on RA. (A few lower O2 readings -->80-84% during initial 300' however poor waveform noted and reading felt to be inaccurate) Pt educated on on initial EC techniques and importance of gradual incr activity. Pt mother present for session  See below for any follow-up Physical Therapy or equipment needs.  No further PT needs. PT is signing off. Thank you for this referral.         If plan is discharge home, recommend the following:     Can travel by private vehicle        Equipment Recommendations None recommended by PT  Recommendations for Other Services       Functional Status Assessment Patient has not had a recent decline in their functional status     Precautions / Restrictions Precautions Precautions: None Precaution/Restrictions Comments: monitor O2      Mobility  Bed Mobility Overal bed mobility: Independent                  Transfers Overall transfer level: Independent                      Ambulation/Gait Ambulation/Gait assistance:  Independent Gait Distance (Feet): 600 Feet Assistive device: None Gait Pattern/deviations: Step-through pattern Gait velocity: WFL        Stairs            Wheelchair Mobility     Tilt Bed    Modified Rankin (Stroke Patients Only)       Balance Overall balance assessment: No apparent balance deficits (not formally assessed)                                           Pertinent Vitals/Pain Pain Assessment Pain Assessment: No/denies pain    Home Living Family/patient expects to be discharged to:: Private residence Living Arrangements: Parent Available Help at Discharge: Family Type of Home: House         Home Layout: One level Home Equipment: None      Prior Function Prior Level of Function : Independent/Modified Independent             Mobility Comments: reports he has been working out again       [employer] Rx Assessment   Upper Extremity Assessment Upper Extremity Assessment: Overall WFL for tasks assessed    Lower Extremity Assessment Lower Extremity Assessment: Overall WFL for tasks assessed       Communication   Communication Communication: No apparent difficulties    Cognition Arousal: Alert Behavior  During Therapy: WFL for tasks assessed/performed   PT - Cognitive impairments: No apparent impairments                         Following commands: Intact       Cueing Cueing Techniques: Verbal cues     General Comments      Exercises     Assessment/Plan    PT Assessment Patient does not need any further PT services  PT Problem List         PT Treatment Interventions      PT Goals (Current goals can be found in the Care Plan section)  Acute Rehab PT Goals PT Goal Formulation: All assessment and education complete, DC therapy    Frequency       Co-evaluation               AM-PAC PT "6 Clicks" Mobility  Outcome Measure Help needed turning from your back to your side while  in a flat bed without using bedrails?: None Help needed moving from lying on your back to sitting on the side of a flat bed without using bedrails?: None Help needed moving to and from a bed to a chair (including a wheelchair)?: None Help needed standing up from a chair using your arms (e.g., wheelchair or bedside chair)?: None Help needed to walk in hospital room?: None Help needed climbing 3-5 steps with a railing? : None 6 Click Score: 24    End of Session   Activity Tolerance: Patient tolerated treatment well Patient left: in chair;with call bell/phone within reach;with family/visitor present   PT Visit Diagnosis: Other abnormalities of gait and mobility (R26.89)    Time: 1610-9604 PT Time Calculation (min) (ACUTE ONLY): 18 min   Charges:   PT Evaluation $PT Eval Low Complexity: 1 Low   PT General Charges $$ ACUTE PT VISIT: 1 Visit         Marlee Armenteros, PT  Acute Rehab Dept [facility]) [phone]  03/28/2024   [facility] 03/28/2024, 3:36 PM

## 2024-03-29 ENCOUNTER — Inpatient Hospital Stay (HOSPITAL_COMMUNITY)

## 2024-03-29 DIAGNOSIS — J9601 Acute respiratory failure with hypoxia: Secondary | ICD-10-CM | POA: Diagnosis not present

## 2024-03-29 DIAGNOSIS — F418 Other specified anxiety disorders: Secondary | ICD-10-CM | POA: Diagnosis not present

## 2024-03-29 DIAGNOSIS — J4531 Mild persistent asthma with (acute) exacerbation: Secondary | ICD-10-CM | POA: Diagnosis not present

## 2024-03-29 DIAGNOSIS — J189 Pneumonia, unspecified organism: Secondary | ICD-10-CM | POA: Diagnosis not present

## 2024-03-29 LAB — CBC WITH DIFFERENTIAL/PLATELET
Abs Immature Granulocytes: 0.02 10*3/uL (ref 0.00–0.07)
Basophils Absolute: 0 10*3/uL (ref 0.0–0.1)
Basophils Relative: 1 %
Eosinophils Absolute: 0.2 10*3/uL (ref 0.0–0.5)
Eosinophils Relative: 4 %
HCT: 39.4 % (ref 39.0–52.0)
Hemoglobin: 12.4 g/dL — ABNORMAL LOW (ref 13.0–17.0)
Immature Granulocytes: 0 %
Lymphocytes Relative: 26 %
Lymphs Abs: 1.3 10*3/uL (ref 0.7–4.0)
MCH: 27.7 pg (ref 26.0–34.0)
MCHC: 31.5 g/dL (ref 30.0–36.0)
MCV: 87.9 fL (ref 80.0–100.0)
Monocytes Absolute: 0.6 10*3/uL (ref 0.1–1.0)
Monocytes Relative: 13 %
Neutro Abs: 2.8 10*3/uL (ref 1.7–7.7)
Neutrophils Relative %: 56 %
Platelets: 258 10*3/uL (ref 150–400)
RBC: 4.48 MIL/uL (ref 4.22–5.81)
RDW: 12.7 % (ref 11.5–15.5)
WBC: 5.1 10*3/uL (ref 4.0–10.5)
nRBC: 0 % (ref 0.0–0.2)

## 2024-03-29 LAB — BASIC METABOLIC PANEL WITH GFR
Anion gap: 10 (ref 5–15)
BUN: 22 mg/dL — ABNORMAL HIGH (ref 6–20)
CO2: 23 mmol/L (ref 22–32)
Calcium: 9 mg/dL (ref 8.9–10.3)
Chloride: 101 mmol/L (ref 98–111)
Creatinine, Ser: 0.93 mg/dL (ref 0.61–1.24)
GFR, Estimated: >60 mL/min (ref >60)
Glucose, Bld: 119 mg/dL — ABNORMAL HIGH (ref 70–99)
Potassium: 3.9 mmol/L (ref 3.5–5.1)
Sodium: 134 mmol/L — ABNORMAL LOW (ref 135–145)

## 2024-03-29 LAB — LEGIONELLA PNEUMOPHILA SEROGP 1 UR AG: L. pneumophila Serogp 1 Ur Ag: NEGATIVE

## 2024-03-29 MED ORDER — CEFPODOXIME PROXETIL 200 MG PO TABS: 200.0000 mg | ORAL_TABLET | Freq: Two times a day (BID) | ORAL | 0 refills | Status: AC

## 2024-03-29 MED ORDER — DOXYCYCLINE HYCLATE 100 MG PO TABS: 100.0000 mg | ORAL_TABLET | Freq: Two times a day (BID) | ORAL | 0 refills | Status: AC

## 2024-03-29 MED ORDER — SODIUM CHLORIDE 0.9 % IV SOLN
2.0000 g | INTRAVENOUS | Status: DC
  Administered 2024-03-29: 2 g via INTRAVENOUS
  Filled 2024-03-29: qty 20

## 2024-03-29 NOTE — Discharge Summary (Signed)
 [facility] Phys[facility] Discharge Summary   Patient name: Joe Lawrence  Admit date:     03/26/2024  Discharge date: 03/29/2024  Attending Physician: Imogene Burn, Cagney Degrace [[id number]]  Discharge Physician: Carollee Herter   PCP: Ronnald Nian, MD  Admitted From: Home  Disposition:  Home  Recommendations for Outpatient Follow-up:  Follow up with PCP in 1-2 weeks  Home Health:No Equipment/Devices: None  Discharge Condition:Stable CODE STATUS:FULL Diet recommendation: Heart Healthy Fluid Restriction: None  Hospital Summary: HPI: Joe Lawrence is a 53 y.o. male with medical history significant for asthma, opioid use disorder on Suboxone, history of alcohol use disorder in remission with prior alcoholic pancreatitis, depression, anxiety, bipolar disorder who presented to the ED for evaluation of shortness of breath associated with cough and hypoxia.   Patient states over the last 2 days he has noted dyspnea with exertion.  He has felt very short of breath with activity and had difficulty taking deep inspiration.  He has had a nonproductive cough associated with this, worse when taking deep breaths.  He noticed that his home pulse ox showed that his SpO2 was dropping to 85% at home.  He tried his levalbuterol inhaler with some transient improvement in breathing and SpO2 however dyspnea and hypoxia quickly returned.  He does note that his insurance sent him a letter today that his levalbuterol with no longer be covered.   Patient denies fevers, chills, diaphoresis.  He reports some loose stools but no frank diarrhea.  He denies abdominal pain.  He denies any recent aspiration events.   He reports ongoing tobacco use, smokes about 8 cigarettes daily, and has been working on cutting back.  He has been using nicotine lozenges at home.  He reports prior heavy alcohol use but has completely quit due to issues with pancreatitis in the past.  Initial vitals showed BP 138/77, pulse 84, RR  13, temp 97.8 F, SpO2 94% on room air. Per EDP SpO2 dropped to 85% while on room air, he was placed on 2 L supplemental O2 via [place] with improvement to >94%.   Patient was given IV ceftriaxone and azithromycin. The hospitalist service was consulted to admit.   Significant Events: Admitted 03/26/2024 right sided CAP, acute respiratory failure with hypoxia   Significant Labs: WBC 6.2, Hg 12.2, plt 227 Na 131, K 3.8, CO2 of 26, BUN 20, Scr 0.95, glu 110 Covid/flu/rsv negative RVP negative  Significant Imaging Studies:   Antibiotic Therapy: Anti-infectives (From admission, onward)    Start     Dose/Rate Route Frequency Ordered Stop   03/27/24 1700  cefTRIAXone (ROCEPHIN) 2 g in sodium chloride 0.9 % 100 mL IVPB        2 g 200 mL/hr over 30 Minutes Intravenous Every 24 hours 03/26/24 1953 03/31/24 1659   03/27/24 1700  azithromycin (ZITHROMAX) 500 mg in sodium chloride 0.9 % 250 mL IVPB        500 mg 250 mL/hr over 60 Minutes Intravenous Every 24 hours 03/26/24 1953 03/31/24 1659   03/26/24 1615  cefTRIAXone (ROCEPHIN) 1 g in sodium chloride 0.9 % 100 mL IVPB        1 g 200 mL/hr over 30 Minutes Intravenous  Once 03/26/24 1600 03/26/24 1900   03/26/24 1615  azithromycin (ZITHROMAX) 500 mg in sodium chloride 0.9 % 250 mL IVPB        500 mg 250 mL/hr over 60 Minutes Intravenous  Once 03/26/24 1600 03/26/24 2018       Procedures:  Consultants:    Hospital Course by Problem: * Community acquired pneumonia of right upper and lower lobes of lung 03-27-2024 change to po doxycycline due to prolonged Qtc on EKG. Continue IV rocephin. Had pneumococcal vaccination in 2024.  03-28-2024 stable on doxycycline and IV rocephin. Attempt to wean off O2 today. Pt to get out of bed and ambulate today. Plan for DC to home in AM. His hyponatremia is clinically insignificant.  03-29-2024 legionella and strep pneumo antigen tests are negative. Repeat CXR today shows improvement/resolution of his  pneumonia on my read. Final report not yet available. Ready for Dc to home. Advise him to stop smoking.  DC to home with 7 more days of po vantin and doxycycline. F/u with PCP.  Mild persistent asthma with acute exacerbation-resolved as of 03/29/2024 03-27-2024 no wheezing. Continue with prn albuterol nebs. No indication for steroids at this time.  03-28-2024 no wheezing today. Better lower lobe BS today.  03-29-2024 resolved. No wheezing  Acute respiratory failure with hypoxia (HCC)-resolved as of 03/29/2024 03-27-2024 not on home O2. Continue with supplemental O2.  03-28-2024 wean to RA. RT or RN, please document why pt cannot be weaned to RA.  03-29-2024 resolved. Weaned to RA.  Tobacco abuse 03-27-2024 nicotine patch 7 mg/day.  03-28-2024 continue with nicotine patch.  03-29-2024 advised him to stop smoking completely.  Depression with anxiety 03-27-2024 continue with lexapro, prn atarax.  03-28-2024 stable on lexapro, prn atarax.  03-29-2024 stable.  Opioid use disorder, severe, dependence (HCC) 03-27-2024 continue with QID suboxone. Pt has valid prescriber and valid Rx in PDMP.  03-28-2024 continue with QID suboxone.  03-29-2024 stable.  History of alcohol abuse 03-27-2024 stable.  03-28-2024 stable.  03-29-2024 stable.    Discharge Diagnoses:  Principal Problem:   Community acquired pneumonia of right upper and lower lobes of lung Active Problems:   History of alcohol abuse   Opioid use disorder, severe, dependence (HCC)   Depression with anxiety   Tobacco abuse   Discharge Instructions  Discharge Instructions     Call MD for:  difficulty breathing, headache or visual disturbances   Complete by: As directed    Call MD for:  extreme fatigue   Complete by: As directed    Call MD for:  hives   Complete by: As directed    Call MD for:  persistant dizziness or light-headedness   Complete by: As directed    Call MD for:  persistant nausea and  vomiting   Complete by: As directed    Call MD for:  redness, tenderness, or signs of infection (pain, swelling, redness, odor or green/yellow discharge around incision site)   Complete by: As directed    Call MD for:  severe uncontrolled pain   Complete by: As directed    Call MD for:  temperature >100.4   Complete by: As directed    Diet - low sodium heart healthy   Complete by: As directed    Discharge instructions   Complete by: As directed    1. Follow up with your primary care provider in 1-2 weeks following discharge from hospital. 2. Stop smoking   Increase activity slowly   Complete by: As directed       Allergies as of 03/29/2024       Reactions   Celexa [citalopram] Other (See Comments)   Muscle tighness   Chlorpromazine Hcl Other (See Comments)   Pt does not remember this allergy.  May be 20 years ago.  Iodine Other (See Comments)   Skin has burning sensation.   Sulfonamide Derivatives Other (See Comments)   Patient does not know of this allergy or the reaction.   Voltaren [diclofenac Sodium] Other (See Comments)   voltaren has caused his arm to swell in the past, does not remember when this happened.        Medication List     STOP taking these medications    ondansetron 4 MG tablet Commonly known as: ZOFRAN       TAKE these medications    cefpodoxime 200 MG tablet Commonly known as: VANTIN Take 1 tablet (200 mg total) by mouth 2 (two) times daily for 7 days.   cloNIDine 0.1 MG tablet Commonly known as: CATAPRES Take 0.5-1 tablets by mouth 4 (four) times daily as needed (anxiety).   doxycycline 100 MG tablet Commonly known as: VIBRA-TABS Take 1 tablet (100 mg total) by mouth every 12 (twelve) hours for 7 days.   escitalopram 20 MG tablet Commonly known as: LEXAPRO Take 1 tablet (20 mg total) by mouth daily.   ferrous sulfate 325 (65 FE) MG EC tablet Take 325 mg by mouth daily with breakfast.   furosemide 20 MG tablet Commonly known as:  LASIX Take 0.5-1 tablets (10-20 mg total) by mouth daily as needed for fluid or edema.   gabapentin 600 MG tablet Commonly known as: Neurontin Take 1 tablet (600 mg total) by mouth in the morning, at noon, in the evening, and at bedtime.   hydrOXYzine 50 MG capsule Commonly known as: VISTARIL Take 50 mg by mouth as needed for anxiety.   levalbuterol 45 MCG/ACT inhaler Commonly known as: XOPENEX HFA Inhale 1 puff into the lungs as needed for wheezing or shortness of breath.   loratadine 10 MG tablet Commonly known as: CLARITIN Take 10 mg by mouth daily.   olopatadine 0.1 % ophthalmic solution Commonly known as: Pataday Place 1 drop into both eyes daily as needed (itching).   OVER THE COUNTER MEDICATION Take 1-2 capsules by mouth with breakfast, with lunch, and with evening meal. Enzymatica digestive enzymes   pantoprazole 40 MG tablet Commonly known as: PROTONIX Take 1 tablet (40 mg total) by mouth 2 (two) times daily.   prazosin 2 MG capsule Commonly known as: MINIPRESS Take 2 mg by mouth daily.   QUEtiapine 50 MG tablet Commonly known as: SEROQUEL Take 1 tablet (50 mg total) by mouth 2 (two) times daily. What changed:  how much to take when to take this additional instructions   Suboxone 8-2 MG Film Generic drug: Buprenorphine HCl-Naloxone HCl Place 0.5 each under the tongue in the morning, at noon, in the evening, and at bedtime.   Xiidra 5 % Soln Generic drug: Lifitegrast Place 1 drop into both eyes 2 (two) times daily.        Allergies  Allergen Reactions   Celexa [Citalopram] Other (See Comments)    Muscle tighness   Chlorpromazine Hcl Other (See Comments)    Pt does not remember this allergy.  May be 20 years ago.   Iodine Other (See Comments)    Skin has burning sensation.   Sulfonamide Derivatives Other (See Comments)    Patient does not know of this allergy or the reaction.   Voltaren [Diclofenac Sodium] Other (See Comments)    voltaren has  caused his arm to swell in the past, does not remember when this happened.    Discharge Exam: Vitals:   03/29/24 0512 03/29/24 0600  BP:  131/80   Pulse: 72   Resp: 16   Temp: 97.7 F (36.5 C)   SpO2: (!) 88% 95%    Physical Exam Vitals and nursing note reviewed.  Constitutional:      General: He is not in acute distress.    Appearance: He is normal weight. He is not toxic-appearing or diaphoretic.  HENT:     Head: Normocephalic and atraumatic.     Nose: Nose normal.  Eyes:     General: No scleral icterus. Cardiovascular:     Rate and Rhythm: Normal rate and regular rhythm.  Pulmonary:     Effort: Pulmonary effort is normal. No respiratory distress.     Breath sounds: Normal breath sounds. No wheezing.  Abdominal:     General: Abdomen is flat. Bowel sounds are normal.     Palpations: Abdomen is soft.  Musculoskeletal:     Right lower leg: No edema.     Left lower leg: No edema.  Skin:    General: Skin is warm and dry.     Capillary Refill: Capillary refill takes less than 2 seconds.  Neurological:     General: No focal deficit present.     Mental Status: He is alert and oriented to person, place, and time.     The results of significant diagnostics from this hospitalization (including imaging, microbiology, ancillary and laboratory) are listed below for reference.    Microbiology: Recent Results (from the past 240 hours)  Resp panel by RT-PCR (RSV, Flu A&B, Covid) Anterior Nasal Swab     Status: None   Collection Time: 03/26/24  7:22 PM   Specimen: Anterior Nasal Swab  Result Value Ref Range Status   SARS Coronavirus 2 by RT PCR NEGATIVE NEGATIVE Final    Comment: (NOTE) SARS-CoV-2 target nucleic acids are NOT DETECTED.  The SARS-CoV-2 RNA is generally detectable in upper respiratory specimens during the acute phase of infection. The lowest concentration of SARS-CoV-2 viral copies this assay can detect is 138 copies/mL. A negative result does not preclude  SARS-Cov-2 infection and should not be used as the sole basis for treatment or other patient management decisions. A negative result may occur with  improper specimen collection/handling, submission of specimen other than nasopharyngeal swab, presence of viral mutation(s) within the areas targeted by this assay, and inadequate number of viral copies(<138 copies/mL). A negative result must be combined with clinical observations, patient history, and epidemiological information. The expected result is Negative.  Fact Sheet for Patients:  BloggerCourse.com  Fact Sheet for Healthcare Providers:  SeriousBroker.it  This test is no t yet approved or cleared by the [place] FDA and  has been authorized for detection and/or diagnosis of SARS-CoV-2 by FDA under an Emergency Use Authorization (EUA). This EUA will remain  in effect (meaning this test can be used) for the duration of the COVID-19 declaration under Section 564(b)(1) of the Act, 21 U.S.C.section 360bbb-3(b)(1), unless the authorization is terminated  or revoked sooner.       Influenza A by PCR NEGATIVE NEGATIVE Final   Influenza B by PCR NEGATIVE NEGATIVE Final    Comment: (NOTE) The Xpert Xpress SARS-CoV-2/FLU/RSV plus assay is intended as an aid in the diagnosis of influenza from Nasopharyngeal swab specimens and should not be used as a sole basis for treatment. Nasal washings and aspirates are unacceptable for Xpert Xpress SARS-CoV-2/FLU/RSV testing.  Fact Sheet for Patients: BloggerCourse.com  Fact Sheet for Healthcare Providers: SeriousBroker.it  This test is not yet approved or  cleared by the [place] and has been authorized for detection and/or diagnosis of SARS-CoV-2 by FDA under an Emergency Use Authorization (EUA). This EUA will remain in effect (meaning this test can be used) for the duration of  the COVID-19 declaration under Section 564(b)(1) of the Act, 21 U.S.C. section 360bbb-3(b)(1), unless the authorization is terminated or revoked.     Resp Syncytial Virus by PCR NEGATIVE NEGATIVE Final    Comment: (NOTE) Fact Sheet for Patients: BloggerCourse.com  Fact Sheet for Healthcare Providers: SeriousBroker.it  This test is not yet approved or cleared by the [place] FDA and has been authorized for detection and/or diagnosis of SARS-CoV-2 by FDA under an Emergency Use Authorization (EUA). This EUA will remain in effect (meaning this test can be used) for the duration of the COVID-19 declaration under Section 564(b)(1) of the Act, 21 U.S.C. section 360bbb-3(b)(1), unless the authorization is terminated or revoked.  Performed at [facility], [address], [place], [place] [address]   Respiratory (~20 pathogens) panel by PCR     Status: None   Collection Time: 03/26/24  7:22 PM   Specimen: Anterior Nasal Swab; Respiratory  Result Value Ref Range Status   Adenovirus NOT DETECTED NOT DETECTED Final   Coronavirus 229E NOT DETECTED NOT DETECTED Final    Comment: (NOTE) The Coronavirus on the Respiratory Panel, DOES NOT test for the novel  Coronavirus (2019 nCoV)    Coronavirus HKU1 NOT DETECTED NOT DETECTED Final   Coronavirus NL63 NOT DETECTED NOT DETECTED Final   Coronavirus OC43 NOT DETECTED NOT DETECTED Final   Metapneumovirus NOT DETECTED NOT DETECTED Final   Rhinovirus / Enterovirus NOT DETECTED NOT DETECTED Final   Influenza A NOT DETECTED NOT DETECTED Final   Influenza B NOT DETECTED NOT DETECTED Final   Parainfluenza Virus 1 NOT DETECTED NOT DETECTED Final   Parainfluenza Virus 2 NOT DETECTED NOT DETECTED Final   Parainfluenza Virus 3 NOT DETECTED NOT DETECTED Final   Parainfluenza Virus 4 NOT DETECTED NOT DETECTED Final   Respiratory Syncytial Virus NOT DETECTED NOT DETECTED Final    Bordetella pertussis NOT DETECTED NOT DETECTED Final   Bordetella Parapertussis NOT DETECTED NOT DETECTED Final   Chlamydophila pneumoniae NOT DETECTED NOT DETECTED Final   Mycoplasma pneumoniae NOT DETECTED NOT DETECTED Final    Comment: Performed at [facility], [address], [place], [place] [address]    Pneumonia Antigens Lab Results  Component Value Date/Time   [facility] NEGATIVE 03/26/2024 07:53 PM   Labs: BNP (last 3 results) Recent Labs    12/11/23 0547 12/14/23 0711  BNP 312.8* 17.9   Basic Metabolic Panel: Recent Labs  Lab 03/26/24 1652 03/27/24 0526 03/28/24 0512 03/29/24 0406  NA 131* 133* 133* 134*  K 3.8 4.4 4.1 3.9  CL 96* 100 100 101  CO2 26 23 24 23   GLUCOSE 110* 103* 103* 119*  BUN 20 16 19  22*  CREATININE 0.95 0.76 1.12 0.93  CALCIUM 8.9 9.0 8.9 9.0   Liver Function Tests: Recent Labs  Lab 03/28/24 0512  AST 16  ALT 13  ALKPHOS 65  BILITOT 0.5  PROT 6.7  ALBUMIN 3.3*   CBC: Recent Labs  Lab 03/26/24 1652 03/27/24 0526 03/28/24 0512 03/29/24 0406  WBC 6.2 6.5 5.6 5.1  NEUTROABS  --   --  3.6 2.8  HGB 12.2* 12.3* 11.8* 12.4*  HCT 39.3 39.5 38.9* 39.4  MCV 88.5 89.0 90.3 87.9  PLT 227 267 251 258  Sepsis Labs Recent Labs  Lab 03/26/24 1652 03/27/24 0526 03/28/24 0512 03/29/24 0406  PROCALCITON  --   --  <0.10  --   WBC 6.2 6.5 5.6 5.1    Procedures/Studies: DG Chest 2 View Result Date: 03/26/2024 CLINICAL DATA:  Shortness of breath. EXAM: CHEST - 2 VIEW COMPARISON:  March 04, 2024. FINDINGS: The heart size and mediastinal contours are within normal limits. Faint opacities are noted in right upper and lower lobes concerning for pneumonia. Left lung is clear. The visualized skeletal structures are unremarkable. IMPRESSION: Mild right lung opacities are noted concerning for pneumonia. Followup PA and lateral chest X-ray is recommended in 3-4 weeks following trial of antibiotic therapy to ensure resolution and exclude  underlying malignancy. Electronically Signed   By: Lupita Raider M.D.   On: 03/26/2024 16:22   DG Chest 2 View Result Date: 03/08/2024 CLINICAL DATA:  History of multifocal pneumonia EXAM: CHEST - 2 VIEW COMPARISON:  December 14, 2023 FINDINGS: The heart size and mediastinal contours are within normal limits. Both lungs are clear. The visualized skeletal structures are unremarkable. IMPRESSION: No active cardiopulmonary disease. Electronically Signed   By: Shaaron Adler M.D.   On: 03/08/2024 16:48    Time coordinating discharge: 50 mins  SIGNED:  Carollee Herter, DO [facility] 03/29/24, 9:28 AM

## 2024-03-29 NOTE — TOC Transition Note (Signed)
 Transition of Care [facility]) - Discharge Note   Patient Details  Name: Joe Lawrence MRN: [id number] Date of Birth: 1971-02-27  [facility]) CM/SW Contact:  Jessie Foot, RN Phone Number: 03/29/2024, 10:14 AM   Clinical Narrative:    No PT recommendations. TOC signing off   Final next level of care: Home/Self Care Barriers to Discharge: Barriers Resolved   Patient Goals and CMS Choice Patient states their goals for this hospitalization and ongoing recovery are:: home CMS Medicare.gov Compare Post Acute Care list provided to:: Patient   [place] ownership interest in [facility].provided to:: Patient    Discharge Placement                       Discharge Plan and Services Additional resources added to the After Visit Summary for     Discharge Planning Services: CM Consult                                 Social Drivers of Health (SDOH) Interventions SDOH Screenings   Food Insecurity: No Food Insecurity (03/27/2024)  Housing: Low Risk  (03/27/2024)  Transportation Needs: No Transportation Needs (03/27/2024)  Utilities: Not At Risk (03/27/2024)  Alcohol Screen: Low Risk  (02/13/2023)  Depression (PHQ2-9): Low Risk  (03/02/2024)  Financial Resource Strain: Low Risk  (03/22/2022)  Physical Activity: Sufficiently Active (08/21/2023)  Social Connections: Socially Isolated (08/21/2023)  Stress: No Stress Concern Present (08/21/2023)  Tobacco Use: High Risk (03/26/2024)     Readmission Risk Interventions    12/12/2023   12:52 PM  Readmission Risk Prevention Plan  Transportation Screening Complete  Medication Review (RN Care Manager) Complete  PCP or Specialist appointment within 3-5 days of discharge Complete  HRI or Home Care Consult Complete  SW Recovery Care/Counseling Consult Complete  Palliative Care Screening Not Applicable  Skilled Nursing Facility Not Applicable

## 2024-03-29 NOTE — Progress Notes (Signed)
 Discharge instructions reviewed with patient. All questions answered. All belongings accounted for. Patient to follow up with MD in  1 weeks. Patient medications sent to retail pharmacy. PIV removed. Ambulated to private vehicle for transport home

## 2024-03-29 NOTE — Progress Notes (Signed)
 PROGRESS NOTE    Joe Lawrence  UJW:[id number] DOB: 1971/10/11 DOA: 03/26/2024 PCP: Ronnald Nian, MD  Subjective: Pt seen and examined.  Weaned to RA. Did well with PT. No home PT needs Repeat CXR today shows improvement/resolution of his pneumonia on my read. Final report not yet available. Ready for Dc to home. Advise him to stop smoking.   Hospital Course: HPI: Joe Lawrence is a 53 y.o. male with medical history significant for asthma, opioid use disorder on Suboxone, history of alcohol use disorder in remission with prior alcoholic pancreatitis, depression, anxiety, bipolar disorder who presented to the ED for evaluation of shortness of breath associated with cough and hypoxia.   Patient states over the last 2 days he has noted dyspnea with exertion.  He has felt very short of breath with activity and had difficulty taking deep inspiration.  He has had a nonproductive cough associated with this, worse when taking deep breaths.  He noticed that his home pulse ox showed that his SpO2 was dropping to 85% at home.  He tried his levalbuterol inhaler with some transient improvement in breathing and SpO2 however dyspnea and hypoxia quickly returned.  He does note that his insurance sent him a letter today that his levalbuterol with no longer be covered.   Patient denies fevers, chills, diaphoresis.  He reports some loose stools but no frank diarrhea.  He denies abdominal pain.  He denies any recent aspiration events.   He reports ongoing tobacco use, smokes about 8 cigarettes daily, and has been working on cutting back.  He has been using nicotine lozenges at home.  He reports prior heavy alcohol use but has completely quit due to issues with pancreatitis in the past.  Initial vitals showed BP 138/77, pulse 84, RR 13, temp 97.8 F, SpO2 94% on room air. Per EDP SpO2 dropped to 85% while on room air, he was placed on 2 L supplemental O2 via [place] with improvement to >94%.    Patient was given IV ceftriaxone and azithromycin. The hospitalist service was consulted to admit.   Significant Events: Admitted 03/26/2024 right sided CAP, acute respiratory failure with hypoxia   Significant Labs: WBC 6.2, Hg 12.2, plt 227 Na 131, K 3.8, CO2 of 26, BUN 20, Scr 0.95, glu 110 Covid/flu/rsv negative RVP negative  Significant Imaging Studies:   Antibiotic Therapy: Anti-infectives (From admission, onward)    Start     Dose/Rate Route Frequency Ordered Stop   03/27/24 1700  cefTRIAXone (ROCEPHIN) 2 g in sodium chloride 0.9 % 100 mL IVPB        2 g 200 mL/hr over 30 Minutes Intravenous Every 24 hours 03/26/24 1953 03/31/24 1659   03/27/24 1700  azithromycin (ZITHROMAX) 500 mg in sodium chloride 0.9 % 250 mL IVPB        500 mg 250 mL/hr over 60 Minutes Intravenous Every 24 hours 03/26/24 1953 03/31/24 1659   03/26/24 1615  cefTRIAXone (ROCEPHIN) 1 g in sodium chloride 0.9 % 100 mL IVPB        1 g 200 mL/hr over 30 Minutes Intravenous  Once 03/26/24 1600 03/26/24 1900   03/26/24 1615  azithromycin (ZITHROMAX) 500 mg in sodium chloride 0.9 % 250 mL IVPB        500 mg 250 mL/hr over 60 Minutes Intravenous  Once 03/26/24 1600 03/26/24 2018       Procedures:   Consultants:     Assessment and Plan: * Community acquired pneumonia of right  upper and lower lobes of lung 03-27-2024 change to po doxycycline due to prolonged Qtc on EKG. Continue IV rocephin. Had pneumococcal vaccination in 2024.  03-28-2024 stable on doxycycline and IV rocephin. Attempt to wean off O2 today. Pt to get out of bed and ambulate today. Plan for DC to home in AM. His hyponatremia is clinically insignificant.  03-29-2024 legionella and strep pneumo antigen tests are negative. Repeat CXR today shows improvement/resolution of his pneumonia on my read. Final report not yet available. Ready for Dc to home. Advise him to stop smoking.  DC to home with 7 more days of po vantin and doxycycline.  F/u with PCP.  Mild persistent asthma with acute exacerbation-resolved as of 03/29/2024 03-27-2024 no wheezing. Continue with prn albuterol nebs. No indication for steroids at this time.  03-28-2024 no wheezing today. Better lower lobe BS today.  03-29-2024 resolved. No wheezing  Acute respiratory failure with hypoxia (HCC)-resolved as of 03/29/2024 03-27-2024 not on home O2. Continue with supplemental O2.  03-28-2024 wean to RA. RT or RN, please document why pt cannot be weaned to RA.  03-29-2024 resolved. Weaned to RA.  Tobacco abuse 03-27-2024 nicotine patch 7 mg/day.  03-28-2024 continue with nicotine patch.  03-29-2024 advised him to stop smoking completely.  Depression with anxiety 03-27-2024 continue with lexapro, prn atarax.  03-28-2024 stable on lexapro, prn atarax.  03-29-2024 stable.  Opioid use disorder, severe, dependence (HCC) 03-27-2024 continue with QID suboxone. Pt has valid prescriber and valid Rx in PDMP.  03-28-2024 continue with QID suboxone.  03-29-2024 stable.  History of alcohol abuse 03-27-2024 stable.  03-28-2024 stable.  03-29-2024 stable.  DVT prophylaxis: enoxaparin (LOVENOX) injection 40 mg Start: 03/26/24 2200    Code Status: Full Code Family Communication: no family at bedside. Pt is decisional. Disposition Plan: return home Reason for continuing need for hospitalization: stable for DC.  Objective: Vitals:   03/28/24 1112 03/28/24 2013 03/29/24 0512 03/29/24 0600  BP: 104/78 127/81 131/80   Pulse: 60 82 72   Resp: 20 19 16    Temp: 98 F (36.7 C) 98.2 F (36.8 C) 97.7 F (36.5 C)   TempSrc:  Oral Oral   SpO2: 96% 90% (!) 88% 95%    Intake/Output Summary (Last 24 hours) at 03/29/2024 6045 Last data filed at 03/28/2024 1639 Gross per 24 hour  Intake 580 ml  Output --  Net 580 ml   There were no vitals filed for this visit.  Examination:  Physical Exam Vitals and nursing note reviewed.  Constitutional:       General: He is not in acute distress.    Appearance: He is normal weight. He is not toxic-appearing or diaphoretic.  HENT:     Head: Normocephalic and atraumatic.     Nose: Nose normal.  Eyes:     General: No scleral icterus. Cardiovascular:     Rate and Rhythm: Normal rate and regular rhythm.  Pulmonary:     Effort: Pulmonary effort is normal. No respiratory distress.     Breath sounds: Normal breath sounds. No wheezing.  Abdominal:     General: Abdomen is flat. Bowel sounds are normal.     Palpations: Abdomen is soft.  Musculoskeletal:     Right lower leg: No edema.     Left lower leg: No edema.  Skin:    General: Skin is warm and dry.     Capillary Refill: Capillary refill takes less than 2 seconds.  Neurological:     General: No focal  deficit present.     Mental Status: He is alert and oriented to person, place, and time.     Data Reviewed: I have personally reviewed following labs and imaging studies  CBC: Recent Labs  Lab 03/26/24 1652 03/27/24 0526 03/28/24 0512 03/29/24 0406  WBC 6.2 6.5 5.6 5.1  NEUTROABS  --   --  3.6 2.8  HGB 12.2* 12.3* 11.8* 12.4*  HCT 39.3 39.5 38.9* 39.4  MCV 88.5 89.0 90.3 87.9  PLT 227 267 251 258   Basic Metabolic Panel: Recent Labs  Lab 03/26/24 1652 03/27/24 0526 03/28/24 0512 03/29/24 0406  NA 131* 133* 133* 134*  K 3.8 4.4 4.1 3.9  CL 96* 100 100 101  CO2 26 23 24 23   GLUCOSE 110* 103* 103* 119*  BUN 20 16 19  22*  CREATININE 0.95 0.76 1.12 0.93  CALCIUM 8.9 9.0 8.9 9.0   GFR: CrCl cannot be calculated (Unknown ideal weight.). Liver Function Tests: Recent Labs  Lab 03/28/24 0512  AST 16  ALT 13  ALKPHOS 65  BILITOT 0.5  PROT 6.7  ALBUMIN 3.3*   BNP (last 3 results) Recent Labs    12/11/23 0547 12/14/23 0711  BNP 312.8* 17.9   Sepsis Labs: Recent Labs  Lab 03/28/24 0512  PROCALCITON <0.10    Pneumonia Antigens Lab Results  Component Value Date/Time   [facility] NEGATIVE 03/26/2024 07:53 PM    Recent Results (from the past 240 hours)  Resp panel by RT-PCR (RSV, Flu A&B, Covid) Anterior Nasal Swab     Status: None   Collection Time: 03/26/24  7:22 PM   Specimen: Anterior Nasal Swab  Result Value Ref Range Status   SARS Coronavirus 2 by RT PCR NEGATIVE NEGATIVE Final    Comment: (NOTE) SARS-CoV-2 target nucleic acids are NOT DETECTED.  The SARS-CoV-2 RNA is generally detectable in upper respiratory specimens during the acute phase of infection. The lowest concentration of SARS-CoV-2 viral copies this assay can detect is 138 copies/mL. A negative result does not preclude SARS-Cov-2 infection and should not be used as the sole basis for treatment or other patient management decisions. A negative result may occur with  improper specimen collection/handling, submission of specimen other than nasopharyngeal swab, presence of viral mutation(s) within the areas targeted by this assay, and inadequate number of viral copies(<138 copies/mL). A negative result must be combined with clinical observations, patient history, and epidemiological information. The expected result is Negative.  Fact Sheet for Patients:  BloggerCourse.com  Fact Sheet for Healthcare Providers:  SeriousBroker.it  This test is no t yet approved or cleared by the [place] FDA and  has been authorized for detection and/or diagnosis of SARS-CoV-2 by FDA under an Emergency Use Authorization (EUA). This EUA will remain  in effect (meaning this test can be used) for the duration of the COVID-19 declaration under Section 564(b)(1) of the Act, 21 U.S.C.section 360bbb-3(b)(1), unless the authorization is terminated  or revoked sooner.       Influenza A by PCR NEGATIVE NEGATIVE Final   Influenza B by PCR NEGATIVE NEGATIVE Final    Comment: (NOTE) The Xpert Xpress SARS-CoV-2/FLU/RSV plus assay is intended as an aid in the diagnosis of influenza from  Nasopharyngeal swab specimens and should not be used as a sole basis for treatment. Nasal washings and aspirates are unacceptable for Xpert Xpress SARS-CoV-2/FLU/RSV testing.  Fact Sheet for Patients: BloggerCourse.com  Fact Sheet for Healthcare Providers: SeriousBroker.it  This test is not yet approved or cleared by the  [place] [employer] trader and has been authorized for detection and/or diagnosis of SARS-CoV-2 by FDA under an TEFL teacher (EUA). This EUA will remain in effect (meaning this test can be used) for the duration of the COVID-19 declaration under Section 564(b)(1) of the Act, 21 U.S.C. section 360bbb-3(b)(1), unless the authorization is terminated or revoked.     Resp Syncytial Virus by PCR NEGATIVE NEGATIVE Final    Comment: (NOTE) Fact Sheet for Patients: BloggerCourse.com  Fact Sheet for Healthcare Providers: SeriousBroker.it  This test is not yet approved or cleared by the [place] FDA and has been authorized for detection and/or diagnosis of SARS-CoV-2 by FDA under an Emergency Use Authorization (EUA). This EUA will remain in effect (meaning this test can be used) for the duration of the COVID-19 declaration under Section 564(b)(1) of the Act, 21 U.S.C. section 360bbb-3(b)(1), unless the authorization is terminated or revoked.  Performed at [facility], [address] [place], [place] [address]   Respiratory (~20 pathogens) panel by PCR     Status: None   Collection Time: 03/26/24  7:22 PM   Specimen: Anterior Nasal Swab; Respiratory  Result Value Ref Range Status   Adenovirus NOT DETECTED NOT DETECTED Final   Coronavirus 229E NOT DETECTED NOT DETECTED Final    Comment: (NOTE) The Coronavirus on the Respiratory Panel, DOES NOT test for the novel  Coronavirus (2019 nCoV)    Coronavirus HKU1 NOT DETECTED NOT DETECTED  Final   Coronavirus NL63 NOT DETECTED NOT DETECTED Final   Coronavirus OC43 NOT DETECTED NOT DETECTED Final   Metapneumovirus NOT DETECTED NOT DETECTED Final   Rhinovirus / Enterovirus NOT DETECTED NOT DETECTED Final   Influenza A NOT DETECTED NOT DETECTED Final   Influenza B NOT DETECTED NOT DETECTED Final   Parainfluenza Virus 1 NOT DETECTED NOT DETECTED Final   Parainfluenza Virus 2 NOT DETECTED NOT DETECTED Final   Parainfluenza Virus 3 NOT DETECTED NOT DETECTED Final   Parainfluenza Virus 4 NOT DETECTED NOT DETECTED Final   Respiratory Syncytial Virus NOT DETECTED NOT DETECTED Final   Bordetella pertussis NOT DETECTED NOT DETECTED Final   Bordetella Parapertussis NOT DETECTED NOT DETECTED Final   Chlamydophila pneumoniae NOT DETECTED NOT DETECTED Final   Mycoplasma pneumoniae NOT DETECTED NOT DETECTED Final    Comment: Performed at [facility], [address] [place], [place] [address]    Scheduled Meds:  buprenorphine-naloxone  2 tablet Sublingual QID   doxycycline  100 mg Oral Q12H   enoxaparin (LOVENOX) injection  40 mg Subcutaneous Q24H   escitalopram  20 mg Oral Daily   gabapentin  600 mg Oral QID   guaiFENesin  600 mg Oral BID   nicotine  7 mg Transdermal Daily   pantoprazole  40 mg Oral BID   prazosin  2 mg Oral Daily   QUEtiapine  150 mg Oral QHS   QUEtiapine  50 mg Oral TID WC   Continuous Infusions:  cefTRIAXone (ROCEPHIN)  IV 2 g (03/29/24 0912)     LOS: 2 days   Time spent: 40 minutes  Carollee Herter, DO  [facility]  03/29/2024, 9:27 AM

## 2024-03-29 NOTE — Plan of Care (Signed)

## 2024-03-30 ENCOUNTER — Telehealth: Payer: Self-pay

## 2024-03-30 NOTE — Transitions of Care (Post Inpatient/ED Visit) (Unsigned)
   03/30/2024  Name: Joe Lawrence MRN: [id number] DOB: 01/10/1971  Today's TOC FU Call Status: Today's TOC FU Call Status:: Unsuccessful Call (1st Attempt) Unsuccessful Call (1st Attempt) Date: 03/30/24  Attempted to reach the patient regarding the most recent Inpatient/ED visit.  Follow Up Plan: Additional outreach attempts will be made to reach the patient to complete the Transitions of Care (Post Inpatient/ED visit) call.  Signature Karena Addison, LPN [facility] Nurse Health Advisor Direct Dial [phone]

## 2024-03-31 NOTE — Transitions of Care (Post Inpatient/ED Visit) (Unsigned)
   03/31/2024  Name: Joe Lawrence MRN: [id number] DOB: 12-30-1971  Today's TOC FU Call Status: Today's TOC FU Call Status:: Unsuccessful Call (2nd Attempt) Unsuccessful Call (1st Attempt) Date: 03/30/24 Unsuccessful Call (2nd Attempt) Date: 03/31/24  Attempted to reach the patient regarding the most recent Inpatient/ED visit.  Follow Up Plan: Additional outreach attempts will be made to reach the patient to complete the Transitions of Care (Post Inpatient/ED visit) call.   Signature Karena Addison, LPN [facility] Nurse Health Advisor Direct Dial [phone]

## 2024-04-01 NOTE — Transitions of Care (Post Inpatient/ED Visit) (Signed)
   04/01/2024  Name: Albin Duckett MRN: [id number] DOB: Jan 12, 1971  Today's TOC FU Call Status: Today's TOC FU Call Status:: Unsuccessful Call (3rd Attempt) Unsuccessful Call (1st Attempt) Date: 03/30/24 Unsuccessful Call (2nd Attempt) Date: 03/31/24 Unsuccessful Call (3rd Attempt) Date: 04/01/24  Attempted to reach the patient regarding the most recent Inpatient/ED visit.  Follow Up Plan: No further outreach attempts will be made at this time. We have been unable to contact the patient.  Signature Karena Addison, LPN [facility] Nurse Health Advisor Direct Dial [phone]

## 2024-04-19 ENCOUNTER — Other Ambulatory Visit: Payer: Self-pay | Admitting: Family Medicine

## 2024-04-19 DIAGNOSIS — K219 Gastro-esophageal reflux disease without esophagitis: Secondary | ICD-10-CM

## 2024-05-27 ENCOUNTER — Other Ambulatory Visit: Payer: Self-pay | Admitting: Family Medicine

## 2024-07-21 ENCOUNTER — Telehealth: Payer: Self-pay

## 2024-07-21 ENCOUNTER — Telehealth: Payer: Self-pay | Admitting: Family Medicine

## 2024-07-21 NOTE — Telephone Encounter (Signed)
 Copied from CRM #[id number]. Topic: Clinical - Medication Question >> Jul 20, 2024  4:14 PM Marissa P wrote: Reason for CRM: Patient called stating that the levalbuterol  (XOPENEX  HFA) 45 MCG/ACT inhaler wont be covered by the insurance, so needs to go back to other medication. Also requesting that the medication (fluticasone  pro pionate) for the asthma to be filled please. Please contact patient for further details.

## 2024-07-21 NOTE — Telephone Encounter (Signed)
 Patient is already scheduled for a virtual tomorrow. Did you leave a vm for him to call back to schedule that?

## 2024-07-22 ENCOUNTER — Telehealth (INDEPENDENT_AMBULATORY_CARE_PROVIDER_SITE_OTHER): Admitting: Family Medicine

## 2024-07-22 DIAGNOSIS — J4530 Mild persistent asthma, uncomplicated: Secondary | ICD-10-CM | POA: Diagnosis not present

## 2024-07-22 MED ORDER — ALBUTEROL SULFATE HFA 108 (90 BASE) MCG/ACT IN AERS: 2.0000 | INHALATION_SPRAY | Freq: Four times a day (QID) | RESPIRATORY_TRACT | 1 refills | Status: AC | PRN

## 2024-07-22 MED ORDER — FLUTICASONE PROPIONATE HFA 220 MCG/ACT IN AERO: 2.0000 | INHALATION_SPRAY | Freq: Two times a day (BID) | RESPIRATORY_TRACT | 1 refills | Status: DC

## 2024-07-22 NOTE — Progress Notes (Signed)
   Subjective:    Patient ID: Joe Lawrence, male    DOB: [birth year], 53 y.o.   MRN: [id number]  HPI Documentation for virtual audio and video telecommunications through [place] encounter:  The patient was located at home. 2 patient identifiers used.  The provider was located in the office. The patient did consent to this visit and is aware of possible charges through their insurance for this visit.  The other persons participating in this telemedicine service were none. Time spent on call was 5 minutes and in review of previous records >20 minutes total for counseling and coordination of care.  This virtual service is not related to other E/M service within previous 7 days.  He has an underlying history of asthma however he stopped taking his Flovent  inhaler approximately 5 or 6 months ago but lately because of the weather changes he has had more difficulty and has been using his rescue inhaler several times per day.  Also of note is the fact that he states that he has quit smoking.    Review of Systems     Objective:    Physical Exam Alert and in no distress otherwise not examined       Assessment & Plan:  Mild persistent asthma without complication - Plan: fluticasone  (FLOVENT  HFA) 220 MCG/ACT inhaler, albuterol  (VENTOLIN  HFA) 108 (90 Base) MCG/ACT inhaler  He is to start back on the Flovent .  Instructed him on the proper use of the Ventolin .  He was getting Xopenex  in the past but his insurance will not cover this.  Discussed the use of the rescue inhaler no more than twice per week during the day or twice per month at night otherwise we will need to switch.  He is to call and leave a message on MyChart in about 1 month.

## 2024-07-23 ENCOUNTER — Telehealth: Payer: Self-pay

## 2024-07-23 NOTE — Telephone Encounter (Signed)
 Copied from CRM [id number]. Topic: Clinical - Prescription Issue >> Jul 23, 2024  2:13 PM Carlatta H wrote: Reason for CRM: Patients insurance denied fluticasone  (FLOVENT  HFA) 220 MCG/ACT inhaler [[id number]]//Uyz patient would like replacement called in Arnuity ellipta `inhaler

## 2024-07-24 MED ORDER — ARNUITY ELLIPTA 200 MCG/ACT IN AEPB: 1.0000 | INHALATION_SPRAY | Freq: Every day | RESPIRATORY_TRACT | 3 refills | Status: AC

## 2024-07-24 NOTE — Addendum Note (Signed)
 Addended by: JOYCE NORLEEN BROCKS on: 07/24/2024 01:20 PM   Modules accepted: Orders

## 2024-07-27 ENCOUNTER — Other Ambulatory Visit (HOSPITAL_COMMUNITY): Payer: Self-pay

## 2024-08-23 ENCOUNTER — Other Ambulatory Visit: Payer: Self-pay | Admitting: Family Medicine

## 2024-08-23 ENCOUNTER — Telehealth: Payer: Self-pay | Admitting: Family Medicine

## 2024-08-23 DIAGNOSIS — K219 Gastro-esophageal reflux disease without esophagitis: Secondary | ICD-10-CM

## 2024-08-23 MED ORDER — ZENPEP 10000-32000 UNITS PO CPEP: 2.0000 | ORAL_CAPSULE | Freq: Three times a day (TID) | ORAL | 1 refills | Status: DC

## 2024-08-23 NOTE — Telephone Encounter (Signed)
 [facility] fax  Zeppep 10,000IU capsules  #270 Last filled 05/28/24

## 2024-09-16 ENCOUNTER — Ambulatory Visit: Payer: Medicare Other | Admitting: Family Medicine

## 2024-09-27 ENCOUNTER — Encounter: Payer: Self-pay | Admitting: Family Medicine

## 2024-09-27 ENCOUNTER — Ambulatory Visit (INDEPENDENT_AMBULATORY_CARE_PROVIDER_SITE_OTHER): Admitting: Family Medicine

## 2024-09-27 VITALS — BP 120/80 | HR 84 | Ht 69.0 in | Wt 203.0 lb

## 2024-09-27 DIAGNOSIS — Z8719 Personal history of other diseases of the digestive system: Secondary | ICD-10-CM | POA: Diagnosis not present

## 2024-09-27 DIAGNOSIS — Z131 Encounter for screening for diabetes mellitus: Secondary | ICD-10-CM | POA: Diagnosis not present

## 2024-09-27 DIAGNOSIS — K5903 Drug induced constipation: Secondary | ICD-10-CM | POA: Diagnosis not present

## 2024-09-27 DIAGNOSIS — Z0000 Encounter for general adult medical examination without abnormal findings: Secondary | ICD-10-CM | POA: Diagnosis not present

## 2024-09-27 LAB — POCT GLYCOSYLATED HEMOGLOBIN (HGB A1C): Hemoglobin A1C: 5.8 % — AB (ref 4.0–5.6)

## 2024-09-27 MED ORDER — ZENPEP 10000-32000 UNITS PO CPEP: 2.0000 | ORAL_CAPSULE | Freq: Three times a day (TID) | ORAL | 1 refills | Status: DC

## 2024-09-27 MED ORDER — POLYETHYLENE GLYCOL 3350 17 GM/SCOOP PO POWD: 17.0000 g | Freq: Every day | ORAL | 1 refills | Status: AC

## 2024-09-27 NOTE — Progress Notes (Signed)
 Name: Joe Lawrence   Date of Visit: 09/27/24   Date of last visit with me: Visit date not found   CHIEF COMPLAINT:  Chief Complaint  Patient presents with   Medication Management    Med check plus, awv. Wants to check for diabetes.        HPI:  Discussed the use of AI scribe software for clinical note transcription with the patient, who gave verbal consent to proceed.  History of Present Illness   Joe Lawrence is a 53 year old male with a history of pancreatitis who presents with recurrent abdominal pain.  He experiences intermittent pain in the left upper quadrant, under the ribs, associated with his history of pancreatitis. The pain radiates and is described as a level 4 on a pain scale. It is intermittent and often triggered by dietary factors such as consuming sugar or coffee without food.  He has a history of pancreatitis, which was severe enough to result in a month-long coma and was associated with pneumonia and multi-organ failure. He has a past addiction to substances, including fentanyl, which contributed to his pancreatitis.  He experiences irregular bowel movements, alternating between constipation and soft, pasty stools. His bowel movements were softer this morning. He attributes some of his bowel irregularities to his medication, specifically buprenorphine , which slows his gut down.  He is currently taking several medications, including buprenorphine , Seroquel , Protonix , and Zenpep . He also uses Nicorette  lozenges as part of his effort to quit nicotine .  He drinks about two liters of water daily and has recently started walking for about an hour and a half each day. He is working on reducing his nicotine  use and is currently using Nicorette  lozenges, although he finds them expensive and challenging to quit.         OBJECTIVE:       09/27/2024    3:01 PM  Depression screen PHQ 2/9  Decreased Interest 0  Down, Depressed, Hopeless 0  PHQ  - 2 Score 0     BP Readings from Last 3 Encounters:  09/27/24 120/80  03/29/24 131/80  03/03/24 138/86    BP 120/80   Pulse 84   Ht 5' 9 (1.753 m)   Wt 203 lb (92.1 kg)   SpO2 98%   BMI 29.98 kg/m    Physical Exam   MEASUREMENTS: Weight- 200.      Physical Exam Constitutional:      Appearance: Normal appearance.  Neurological:     General: No focal deficit present.     Mental Status: He is alert and oriented to person, place, and time. Mental status is at baseline.     ASSESSMENT/PLAN:   Assessment & Plan History of pancreatitis  Drug-induced constipation  Medicare annual wellness visit, subsequent    Assessment and Plan    Constipation Intermittent abdominal pain likely due to constipation, exacerbated by certain foods and coffee. Current medications, including buprenorphine , may contribute to constipation. Differential diagnosis includes pancreatitis, but symptoms are not consistent with a flare-up. - Order labs to assess pancreatic and liver function. - Instruct to take Miralax  daily for two weeks. - Advise to switch to Metamucil daily after two weeks. - Ensure adequate hydration with at least 60 ounces of water daily.  Exocrine pancreatic insufficiency Previous severe episodes of pancreatitis. Current symptoms are not consistent with a pancreatitis flare-up. - Order labs to assess pancreatic function. - Refill Zenpep  prescription.  Opioid dependence, on medication-assisted therapy Currently on buprenorphine  and Brixadi .  Medication is effective in managing pain and supporting recovery. Buprenorphine  may contribute to constipation. - Continue current medication-assisted therapy.  Nicotine  dependence Currently using Nicorette  and lozenges. Plans to address nicotine  dependence after resolving current abdominal issues.      Medicare Wellness - Hold on flu and Covid vaccines at this time given possible pancreatitis flare.     I reviewed and addressed  the Medicare-specific components of today's visit, including preventive services eligibility, screening recommendations, and health maintenance items as applicable.      Joe Lawrence A. Vita MD [facility]

## 2024-09-27 NOTE — Addendum Note (Signed)
 Addended by: LATTIE CARLO BROCKS on: 09/27/2024 03:48 PM   Modules accepted: Orders

## 2024-09-28 ENCOUNTER — Telehealth: Payer: Self-pay

## 2024-09-28 ENCOUNTER — Ambulatory Visit: Payer: Self-pay | Admitting: Family Medicine

## 2024-09-28 LAB — COMPREHENSIVE METABOLIC PANEL WITH GFR
ALT: 17 IU/L (ref 0–44)
AST: 18 IU/L (ref 0–40)
Albumin: 4.6 g/dL (ref 3.8–4.9)
Alkaline Phosphatase: 70 IU/L (ref 47–123)
BUN/Creatinine Ratio: 13 (ref 9–20)
BUN: 15 mg/dL (ref 6–24)
Bilirubin Total: 0.4 mg/dL (ref 0.0–1.2)
CO2: 20 mmol/L (ref 20–29)
Calcium: 9.7 mg/dL (ref 8.7–10.2)
Chloride: 96 mmol/L (ref 96–106)
Creatinine, Ser: 1.16 mg/dL (ref 0.76–1.27)
Globulin, Total: 2.3 g/dL (ref 1.5–4.5)
Glucose: 98 mg/dL (ref 70–99)
Potassium: 4.7 mmol/L (ref 3.5–5.2)
Sodium: 131 mmol/L — ABNORMAL LOW (ref 134–144)
Total Protein: 6.9 g/dL (ref 6.0–8.5)
eGFR: 74 mL/min/1.73 (ref >59)

## 2024-09-28 LAB — LIPASE: Lipase: 18 U/L (ref 13–78)

## 2024-09-28 LAB — AMYLASE: Amylase: 67 U/L (ref 31–110)

## 2024-09-28 NOTE — Telephone Encounter (Signed)
 Copied from CRM [id number]. Topic: Clinical - Lab/Test Results >> Sep 28, 2024  4:36 PM Leonette P wrote: Reason for CRM: Patient would like a nurse to call him back about his hemoglobin levels.  CB# [phone]  Called patient back, went over lab results and sent a message to Dr. Vita

## 2024-10-27 ENCOUNTER — Ambulatory Visit: Admitting: Family Medicine

## 2024-10-28 ENCOUNTER — Other Ambulatory Visit: Payer: Self-pay | Admitting: Family Medicine

## 2024-11-11 ENCOUNTER — Ambulatory Visit (INDEPENDENT_AMBULATORY_CARE_PROVIDER_SITE_OTHER): Admitting: Family Medicine

## 2024-11-11 ENCOUNTER — Encounter: Payer: Self-pay | Admitting: Family Medicine

## 2024-11-11 VITALS — BP 124/84 | HR 80 | Wt 198.4 lb

## 2024-11-11 DIAGNOSIS — B1910 Unspecified viral hepatitis B without hepatic coma: Secondary | ICD-10-CM | POA: Diagnosis not present

## 2024-11-11 DIAGNOSIS — B159 Hepatitis A without hepatic coma: Secondary | ICD-10-CM

## 2024-11-11 DIAGNOSIS — R7303 Prediabetes: Secondary | ICD-10-CM

## 2024-11-11 DIAGNOSIS — L989 Disorder of the skin and subcutaneous tissue, unspecified: Secondary | ICD-10-CM | POA: Diagnosis not present

## 2024-11-11 LAB — CBC WITH DIFFERENTIAL/PLATELET
Basophils Absolute: 0.1 x10E3/uL (ref 0.0–0.2)
Basos: 1 %
EOS (ABSOLUTE): 0.1 x10E3/uL (ref 0.0–0.4)
Eos: 3 %
Hematocrit: 41.8 % (ref 37.5–51.0)
Hemoglobin: 13.4 g/dL (ref 13.0–17.7)
Immature Grans (Abs): 0 x10E3/uL (ref 0.0–0.1)
Immature Granulocytes: 0 %
Lymphocytes Absolute: 1.4 x10E3/uL (ref 0.7–3.1)
Lymphs: 40 %
MCH: 27.2 pg (ref 26.6–33.0)
MCHC: 32.1 g/dL (ref 31.5–35.7)
MCV: 85 fL (ref 79–97)
Monocytes Absolute: 0.4 x10E3/uL (ref 0.1–0.9)
Monocytes: 11 %
Neutrophils Absolute: 1.6 x10E3/uL (ref 1.4–7.0)
Neutrophils: 45 %
Platelets: 275 x10E3/uL (ref 150–450)
RBC: 4.92 x10E6/uL (ref 4.14–5.80)
RDW: 12.8 % (ref 11.6–15.4)
WBC: 3.6 x10E3/uL (ref 3.4–10.8)

## 2024-11-11 NOTE — Progress Notes (Signed)
 Name: Joe Lawrence   Date of Visit: 11/11/24   Date of last visit with me: 09/27/2024   CHIEF COMPLAINT:  Chief Complaint  Patient presents with   Acute Visit    Needs to be retested for hep A & B. Tested positive for hep a & b. Thinks it could be a false postive if not needs to be advised on what to do. Wants to talk about prediabetes. As 2 bumps on head that he wants to get checked out.        HPI:  Discussed the use of AI scribe software for clinical note transcription with the patient, who gave verbal consent to proceed.  History of Present Illness   Joe Lawrence is a 53 year old male who presents with concerns about hepatitis A and B testing results and lumps on his head.  He is concerned about recent positive test results for hepatitis A and B from his [facility]  clinic. He is unsure if these results indicate active infection or the presence of antibodies. He believes the results might be false positives as he has not engaged in activities typically associated with transmission, such as sexual activity or needle use. He has been clean and sober for a while.  He experiences frequent diarrhea, which he attributes to his [facility]  use. He describes alternating periods of constipation and diarrhea, with bowel movements being 'pasty' for two to three weeks followed by constipation. He uses Miralax  but continues to experience these symptoms.  He has discovered lumps on his head, one at the back and another at the front. He describes them as non-tender and not bone-related.         OBJECTIVE:       09/27/2024    3:01 PM  Depression screen PHQ 2/9  Decreased Interest 0  Down, Depressed, Hopeless 0  PHQ - 2 Score 0     BP Readings from Last 3 Encounters:  11/11/24 124/84  09/27/24 120/80  03/29/24 131/80    BP 124/84   Pulse 80   Wt 198 lb 6.4 oz (90 kg)   SpO2 98%   BMI 29.30 kg/m    Physical Exam   HEENT: Lump on head, non-tender, not  attached to bone. Non mobile. No concern for malignant growth at this time.      Physical Exam Constitutional:      Appearance: Normal appearance.  Cardiovascular:     Rate and Rhythm: Normal rate and regular rhythm.     Pulses: Normal pulses.     Heart sounds: Normal heart sounds.  Neurological:     General: No focal deficit present.     Mental Status: He is alert and oriented to person, place, and time. Mental status is at baseline.     ASSESSMENT/PLAN:   Assessment & Plan Hepatitis A test positive  Hepatitis B infection without delta agent without hepatic coma, unspecified chronicity  Prediabetes  Bumps on skin    Assessment and Plan    Suspected hepatitis A or B (awaiting lab confirmation) Positive test for hepatitis A or B at [facility]. No recent exposure to risk factors. Unlikely active infection, confirmation needed. - Ordered hepatitis A and B tests.CBC to evaluate WBC - Pending results will determine need for imaging and confirmation.  - Will call with results.  Constipation and intermittent diarrhea likely related to medication Intermittent constipation and diarrhea, possibly related to Suboxone  use. - Continue Miralax  as needed. - Encourage fluid intake of at  least 60 ounces of water daily.  Skin cysts of scalp Non-tender, slow-growing cysts on the scalp, attached to the skin. Unlikely to change significantly. - Continue to monitor cysts for changes.  Prediabetes Hemoglobin A1c of 5.8 indicates prediabetes. He is aware and monitoring blood sugar levels. Lifestyle modifications recommended. - Will repeat hemoglobin A1c next year. - Encouraged lifestyle modifications including increased physical activity and dietary changes.         Teneisha Gignac A. Vita MD [facility]

## 2024-11-12 LAB — HAV, HBV, HCV
HCV Ab: NONREACTIVE
Hep B Core Total Ab: NEGATIVE
Hep B Surface Ab, Qual: NONREACTIVE
Hepatitis B Surface Ag: NEGATIVE
hep A Total Ab: POSITIVE — AB

## 2024-11-12 LAB — HCV INTERPRETATION

## 2024-11-12 LAB — HEPATITIS A ANTIBODY, IGM: Hep A IgM: NEGATIVE

## 2024-11-15 ENCOUNTER — Ambulatory Visit: Payer: Self-pay | Admitting: Family Medicine

## 2024-11-15 ENCOUNTER — Telehealth: Payer: Self-pay | Admitting: Internal Medicine

## 2024-11-15 DIAGNOSIS — B159 Hepatitis A without hepatic coma: Secondary | ICD-10-CM

## 2024-11-15 NOTE — Telephone Encounter (Signed)
 Copied from CRM #[id number]. Topic: Clinical - Lab/Test Results >> Nov 15, 2024  2:41 PM Wess RAMAN wrote: Reason for CRM: Patient would like a call from Lattie Carlo BROCKS, CMA to discuss lab results  Callback #: [phone]

## 2024-11-15 NOTE — Progress Notes (Signed)
 Called Patient and LVM for patient to call back for results.

## 2024-11-15 NOTE — Telephone Encounter (Signed)
 Spoke with patient, he is advised of lab results.

## 2024-11-16 ENCOUNTER — Other Ambulatory Visit

## 2024-11-17 LAB — HEPATITIS A ANTIBODY, IGM: Hep A IgM: NEGATIVE

## 2024-11-18 ENCOUNTER — Other Ambulatory Visit (INDEPENDENT_AMBULATORY_CARE_PROVIDER_SITE_OTHER)

## 2024-11-18 DIAGNOSIS — Z23 Encounter for immunization: Secondary | ICD-10-CM

## 2024-12-06 ENCOUNTER — Other Ambulatory Visit: Payer: Self-pay | Admitting: Family Medicine

## 2024-12-20 ENCOUNTER — Other Ambulatory Visit

## 2024-12-21 ENCOUNTER — Other Ambulatory Visit (INDEPENDENT_AMBULATORY_CARE_PROVIDER_SITE_OTHER)

## 2024-12-21 DIAGNOSIS — Z23 Encounter for immunization: Secondary | ICD-10-CM
